# Patient Record
Sex: Female | Born: 1950 | Race: White | Hispanic: No | Marital: Married | Smoking: Current every day smoker
Health system: Southern US, Community
[De-identification: ages and names within clinical notes are randomized; demographics above are authoritative.]

## PROBLEM LIST (undated history)

## (undated) DIAGNOSIS — B059 Measles without complication: Secondary | ICD-10-CM

## (undated) DIAGNOSIS — J449 Chronic obstructive pulmonary disease, unspecified: Secondary | ICD-10-CM

## (undated) DIAGNOSIS — E785 Hyperlipidemia, unspecified: Secondary | ICD-10-CM

## (undated) DIAGNOSIS — B019 Varicella without complication: Secondary | ICD-10-CM

## (undated) HISTORY — DX: Hyperlipidemia, unspecified: E78.5

## (undated) HISTORY — DX: Varicella without complication: B01.9

---

## 1976-08-27 HISTORY — PX: TUBAL LIGATION: SHX77

## 2006-08-09 ENCOUNTER — Other Ambulatory Visit: Payer: Self-pay

## 2006-08-09 ENCOUNTER — Emergency Department: Payer: Self-pay | Admitting: Emergency Medicine

## 2006-12-09 ENCOUNTER — Other Ambulatory Visit: Payer: Self-pay

## 2006-12-09 ENCOUNTER — Emergency Department: Payer: Self-pay | Admitting: General Practice

## 2009-09-18 ENCOUNTER — Emergency Department: Payer: Self-pay

## 2013-09-25 ENCOUNTER — Emergency Department: Payer: Self-pay | Admitting: Emergency Medicine

## 2013-09-25 LAB — BASIC METABOLIC PANEL
Anion Gap: 4 — ABNORMAL LOW (ref 7–16)
BUN: 14 mg/dL (ref 7–18)
Calcium, Total: 8.9 mg/dL (ref 8.5–10.1)
Chloride: 106 mmol/L (ref 98–107)
Co2: 27 mmol/L (ref 21–32)
Creatinine: 1.03 mg/dL (ref 0.60–1.30)
EGFR (African American): >60
EGFR (Non-African Amer.): 57 — ABNORMAL LOW
Glucose: 103 mg/dL — ABNORMAL HIGH (ref 65–99)
Osmolality: 275 (ref 275–301)
Potassium: 3.6 mmol/L (ref 3.5–5.1)
Sodium: 137 mmol/L (ref 136–145)

## 2013-09-25 LAB — CBC
HCT: 35.4 % (ref 35.0–47.0)
HGB: 12.3 g/dL (ref 12.0–16.0)
MCH: 34.2 pg — ABNORMAL HIGH (ref 26.0–34.0)
MCHC: 34.7 g/dL (ref 32.0–36.0)
MCV: 98 fL (ref 80–100)
Platelet: 183 10*3/uL (ref 150–440)
RBC: 3.6 10*6/uL — ABNORMAL LOW (ref 3.80–5.20)
RDW: 12.4 % (ref 11.5–14.5)
WBC: 10.4 10*3/uL (ref 3.6–11.0)

## 2014-11-08 ENCOUNTER — Encounter (INDEPENDENT_AMBULATORY_CARE_PROVIDER_SITE_OTHER): Payer: Self-pay

## 2014-11-08 ENCOUNTER — Ambulatory Visit (INDEPENDENT_AMBULATORY_CARE_PROVIDER_SITE_OTHER): Payer: Managed Care, Other (non HMO) | Admitting: Nurse Practitioner

## 2014-11-08 ENCOUNTER — Encounter: Payer: Self-pay | Admitting: Nurse Practitioner

## 2014-11-08 VITALS — BP 126/70 | HR 60 | Temp 97.8°F | Resp 14 | Ht 62.75 in | Wt 110.1 lb

## 2014-11-08 DIAGNOSIS — F17200 Nicotine dependence, unspecified, uncomplicated: Secondary | ICD-10-CM

## 2014-11-08 DIAGNOSIS — Z7189 Other specified counseling: Secondary | ICD-10-CM | POA: Diagnosis not present

## 2014-11-08 DIAGNOSIS — Z720 Tobacco use: Secondary | ICD-10-CM | POA: Diagnosis not present

## 2014-11-08 DIAGNOSIS — Z23 Encounter for immunization: Secondary | ICD-10-CM

## 2014-11-08 DIAGNOSIS — Z7689 Persons encountering health services in other specified circumstances: Secondary | ICD-10-CM | POA: Insufficient documentation

## 2014-11-08 NOTE — Progress Notes (Signed)
Subjective:    Patient ID: Alejandra Ortiz, female    DOB: 1950/10/30, 64 y.o.   MRN: [id number]  HPI  Alejandra Ortiz is a 64 yo female establishing care.   1) New pt info:    Diet- At home  Exercise- No formal  Immunizations- Needs tdap  Mammogram- 1995 normal, refuses another  Pap- Unknown, 1978 tubal ligation   Bone Density-  Never  Colonoscopy- No to   Eye Exam- UTD  Dental Exam- Needs dental work   2) Chronic Problems-  Recurrent Pneumonia- goes to ER yearly for pneumonia.   3) Acute Problems-  Tobacco use- Patch and gum- likes the gum best, but still smoking.    Review of Systems  Constitutional: Negative for fever, chills, diaphoresis and fatigue.  Respiratory: Negative for chest tightness, shortness of breath and wheezing.   Cardiovascular: Negative for chest pain, palpitations and leg swelling.  Gastrointestinal: Negative for nausea, vomiting, diarrhea and constipation.  Genitourinary: Negative for dysuria.  Musculoskeletal: Negative for back pain and neck pain.  Skin: Negative for rash.  Neurological: Negative for dizziness, weakness, numbness and headaches.  Hematological: Does not bruise/bleed easily.  Psychiatric/Behavioral: Negative for suicidal ideas and sleep disturbance. The patient is not nervous/anxious.    Past Medical History  Diagnosis Date  . Chicken pox     History   Social History  . Marital Status: Married    Spouse Name: N/A  . Number of Children: N/A  . Years of Education: N/A   Occupational History  . Not on file.   Social History Main Topics  . Smoking status: Current Every Day Smoker -- 1.00 packs/day    Types: Cigarettes  . Smokeless tobacco: Never Used  . Alcohol Use: No  . Drug Use: No  . Sexual Activity: No   Other Topics Concern  . 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 Tubal ligation  1978    Family History  Problem Relation Age of Onset  . Family history unknown: Yes    No Known Allergies  No current outpatient prescriptions on file prior to visit.   No current facility-administered medications on file prior to visit.       Objective:   Physical Exam  Constitutional: She is oriented to person, place, and time. She appears well-developed and well-nourished. No distress.  BP 126/70 mmHg  Pulse 60  Temp(Src) 97.8 F (36.6 C) (Oral)  Resp 14  Ht 5' 2.75" (1.594 m)  Wt 110 lb 1.9 oz (49.95 kg)  BMI 19.66 kg/m2  SpO2 97% Thin   HENT:  Head: Normocephalic and atraumatic.  Right Ear: External ear normal.  Left Ear: External ear normal.  Eyes: EOM are normal. Pupils are equal, round, and reactive to light. Right eye exhibits no discharge. Left eye exhibits no discharge. No scleral icterus.  Cardiovascular: Normal rate, regular rhythm and normal heart sounds.  Exam reveals no gallop and no friction rub.   No murmur heard. Pulmonary/Chest: Effort normal and breath sounds normal. No respiratory distress. She has no wheezes. She has no rales. She exhibits no tenderness.  Musculoskeletal: Normal range of motion. She exhibits no edema or tenderness.  Neurological:  She is alert and oriented to person, place, and time. No cranial nerve deficit. She exhibits normal muscle tone. Coordination normal.  Skin: Skin is warm and dry. No rash noted. She is not diaphoretic.  Psychiatric: She has a normal mood and affect. Her behavior is normal. Judgment and thought content normal.      Assessment & Plan:

## 2014-11-08 NOTE — Patient Instructions (Addendum)
See you on the 23rd!   Nice to meet you and welcome to [facility]!

## 2014-11-08 NOTE — Progress Notes (Signed)
Pre visit review using our clinic review tool, if applicable. No additional management support is needed unless otherwise documented below in the visit note. 

## 2014-11-08 NOTE — Assessment & Plan Note (Signed)
Discussed acute and chronic issues. Reviewed health maintenance measures, PFSHx, and immunizations. Pt will need a physical for her job and is returning on the 23rd of March. Will place bone density referral then.

## 2014-11-09 ENCOUNTER — Telehealth: Payer: Self-pay | Admitting: Nurse Practitioner

## 2014-11-09 NOTE — Telephone Encounter (Signed)
emmi mailed  °

## 2014-11-17 ENCOUNTER — Ambulatory Visit (INDEPENDENT_AMBULATORY_CARE_PROVIDER_SITE_OTHER): Payer: Managed Care, Other (non HMO) | Admitting: Nurse Practitioner

## 2014-11-17 ENCOUNTER — Encounter: Payer: Self-pay | Admitting: Nurse Practitioner

## 2014-11-17 VITALS — BP 116/68 | HR 70 | Temp 98.4°F | Resp 14 | Ht 62.75 in | Wt 110.8 lb

## 2014-11-17 DIAGNOSIS — Z13820 Encounter for screening for osteoporosis: Secondary | ICD-10-CM

## 2014-11-17 DIAGNOSIS — M791 Myalgia, unspecified site: Secondary | ICD-10-CM

## 2014-11-17 DIAGNOSIS — Z720 Tobacco use: Secondary | ICD-10-CM

## 2014-11-17 DIAGNOSIS — Z0000 Encounter for general adult medical examination without abnormal findings: Secondary | ICD-10-CM | POA: Diagnosis not present

## 2014-11-17 DIAGNOSIS — F17200 Nicotine dependence, unspecified, uncomplicated: Secondary | ICD-10-CM

## 2014-11-17 LAB — COMPREHENSIVE METABOLIC PANEL
ALT: 16 U/L (ref 0–35)
AST: 19 U/L (ref 0–37)
Albumin: 4 g/dL (ref 3.5–5.2)
Alkaline Phosphatase: 60 U/L (ref 39–117)
BUN: 14 mg/dL (ref 6–23)
CO2: 31 mEq/L (ref 19–32)
Calcium: 9.9 mg/dL (ref 8.4–10.5)
Chloride: 105 mEq/L (ref 96–112)
Creatinine, Ser: 0.97 mg/dL (ref 0.40–1.20)
GFR: 61.21 mL/min (ref >60.00)
Glucose, Bld: 98 mg/dL (ref 70–99)
Potassium: 5.1 mEq/L (ref 3.5–5.1)
Sodium: 138 mEq/L (ref 135–145)
Total Bilirubin: 0.5 mg/dL (ref 0.2–1.2)
Total Protein: 6.7 g/dL (ref 6.0–8.3)

## 2014-11-17 LAB — LIPID PANEL
Cholesterol: 197 mg/dL (ref 0–200)
HDL: 62.8 mg/dL (ref >39.00)
LDL Cholesterol: 121 mg/dL — ABNORMAL HIGH (ref 0–99)
NonHDL: 134.2
Total CHOL/HDL Ratio: 3
Triglycerides: 64 mg/dL (ref 0.0–149.0)
VLDL: 12.8 mg/dL (ref 0.0–40.0)

## 2014-11-17 LAB — HEMOGLOBIN A1C: Hgb A1c MFr Bld: 5.4 % (ref 4.6–6.5)

## 2014-11-17 LAB — CBC WITH DIFFERENTIAL/PLATELET
Basophils Absolute: 0 10*3/uL (ref 0.0–0.1)
Basophils Relative: 0.5 % (ref 0.0–3.0)
Eosinophils Absolute: 0.1 10*3/uL (ref 0.0–0.7)
Eosinophils Relative: 1.1 % (ref 0.0–5.0)
HCT: 41.5 % (ref 36.0–46.0)
Hemoglobin: 13.9 g/dL (ref 12.0–15.0)
Lymphocytes Relative: 24.1 % (ref 12.0–46.0)
Lymphs Abs: 2.1 10*3/uL (ref 0.7–4.0)
MCHC: 33.6 g/dL (ref 30.0–36.0)
MCV: 97.1 fl (ref 78.0–100.0)
Monocytes Absolute: 0.5 10*3/uL (ref 0.1–1.0)
Monocytes Relative: 5.3 % (ref 3.0–12.0)
Neutro Abs: 6 10*3/uL (ref 1.4–7.7)
Neutrophils Relative %: 69 % (ref 43.0–77.0)
Platelets: 224 10*3/uL (ref 150.0–400.0)
RBC: 4.27 Mil/uL (ref 3.87–5.11)
RDW: 13.1 % (ref 11.5–15.5)
WBC: 8.6 10*3/uL (ref 4.0–10.5)

## 2014-11-17 LAB — TSH: TSH: 2.19 u[IU]/mL (ref 0.35–4.50)

## 2014-11-17 NOTE — Patient Instructions (Signed)

## 2014-11-17 NOTE — Progress Notes (Signed)
Pre visit review using our clinic review tool, if applicable. No additional management support is needed unless otherwise documented below in the visit note. 

## 2014-11-17 NOTE — Assessment & Plan Note (Signed)
Will obtain Bone Density screening

## 2014-11-17 NOTE — Progress Notes (Signed)
Subjective:    Patient ID: Alejandra Ortiz, female    DOB: Sep 23, 1950, 64 y.o.   MRN: [id number]  HPI  Alejandra Ortiz is a 64 yo female here for her annual physical.   1) Health Maintenance-   Diet- Eats at home   Exercise- No formal   Immunizations- UTD  Mammogram- Denies wanting another   Pap- Age stops at 6265 per USPSTF recommendations  Bone Density- will place an order  Colonoscopy- Refuses  Eye Exam- UTD  Dental Exam- Needs dental work   2) Chronic Problems-  None  3) Acute Problems-  Tobacco use- 4 people smoking in house hold   When walking at work- left leg gets weak- denies aching  "feels like she ran a whole marathon on her left leg"   No giving out, stops rests and then it resolves- only happens at work   Review of Systems  Constitutional: Negative for fever, chills, diaphoresis, fatigue and unexpected weight change.  HENT: Negative for tinnitus and trouble swallowing.   Gastrointestinal: Negative for nausea, vomiting, abdominal pain, diarrhea, constipation and blood in stool.  Endocrine: Negative for polydipsia, polyphagia and polyuria.  Genitourinary: Negative for dysuria, hematuria, vaginal discharge and vaginal pain.  Musculoskeletal: Negative for myalgias, back pain, arthralgias and gait problem.  Skin: Negative for color change and rash.  Neurological: Negative for dizziness, weakness, numbness and headaches.  Hematological: Does not bruise/bleed easily.  Psychiatric/Behavioral: Negative for suicidal ideas and sleep disturbance. The patient is not nervous/anxious.    Past Medical History  Diagnosis Date  . Chicken pox     History   Social History  . Marital Status: Married    Spouse Name: N/A  . Number of Children: N/A  . Years of Education: N/A   Occupational History  . Not on file.   Social History Main Topics  . Smoking status: Current Every Day Smoker -- 1.00 packs/day    Types: Cigarettes  . Smokeless tobacco: Never Used  . Alcohol Use:  No  . Drug Use: No  . Sexual Activity: No   Other Topics Concern  . 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 Tubal ligation  1978    Family History  Problem Relation Age of Onset  . Family history unknown: Yes    No Known Allergies  No current outpatient prescriptions on file prior to visit.   No current facility-administered medications on file prior to visit.      Objective:   Physical Exam  Constitutional: She is oriented to person, place, and time. She appears well-developed and well-nourished. No distress.  BP 116/68 mmHg  Pulse 70  Temp(Src) 98.4 F (36.9 C) (Oral)  Resp 14  Ht 5' 2.75" (1.594 m)  Wt 110 lb 12 oz (50.236 kg)  BMI 19.77 kg/m2  SpO2 95%   HENT:  Head: Normocephalic and atraumatic.  Right Ear: External ear normal.  Left Ear: External ear normal.  Nose: Nose normal.  Mouth/Throat: Oropharynx is clear and moist. No oropharyngeal exudate.  TMs and canals clear bilaterally  Eyes: Conjunctivae and EOM are normal. Pupils are equal, round, and reactive to light. Right eye exhibits no discharge. Left eye  exhibits no discharge. No scleral icterus.  Neck: Normal range of motion. Neck supple. No thyromegaly present.  Cardiovascular: Normal rate, regular rhythm, normal heart sounds and intact distal pulses.  Exam reveals no gallop and no friction rub.   No murmur heard. Pulmonary/Chest: Effort normal and breath sounds normal. No respiratory distress. She has no wheezes. She has no rales. She exhibits no tenderness.  Abdominal: Soft. Bowel sounds are normal. She exhibits no distension and no mass. There is no tenderness. There is no rebound and no guarding.  Musculoskeletal: Normal range of motion. She exhibits no edema  or tenderness.  Lymphadenopathy:    She has no cervical adenopathy.  Neurological: She is alert and oriented to person, place, and time. She has normal reflexes. No cranial nerve deficit. She exhibits normal muscle tone. Coordination normal.  Skin: Skin is warm and dry. No rash noted. She is not diaphoretic. No erythema. No pallor.  Psychiatric: She has a normal mood and affect. Her behavior is normal. Judgment and thought content normal.        Assessment & Plan:

## 2014-11-18 ENCOUNTER — Telehealth: Payer: Self-pay | Admitting: Nurse Practitioner

## 2014-11-18 ENCOUNTER — Encounter: Payer: Self-pay | Admitting: *Deleted

## 2014-11-18 NOTE — Telephone Encounter (Signed)
emmi emailed °

## 2014-11-24 DIAGNOSIS — F17210 Nicotine dependence, cigarettes, uncomplicated: Secondary | ICD-10-CM | POA: Insufficient documentation

## 2014-11-24 DIAGNOSIS — F17200 Nicotine dependence, unspecified, uncomplicated: Secondary | ICD-10-CM | POA: Insufficient documentation

## 2014-11-24 DIAGNOSIS — M791 Myalgia, unspecified site: Secondary | ICD-10-CM | POA: Insufficient documentation

## 2014-11-24 NOTE — Assessment & Plan Note (Signed)
Stable. Normal exam today. Asked pt to stay hydrated and we will continue to follow.

## 2014-11-24 NOTE — Assessment & Plan Note (Addendum)
Discussed acute and chronic issues. Reviewed health maintenance measures, PFSHx, and immunizations. Obtain routine labs TSH, Lipid panel, CBC w/ diff, A1c, and CMET.  Breast exam normal today.   Refuses: Mammograms and colonoscopy.

## 2014-12-06 NOTE — Assessment & Plan Note (Addendum)
Will try gum in future. Discussed barriers to cessation. Encouraged cessation.

## 2015-08-31 ENCOUNTER — Ambulatory Visit (INDEPENDENT_AMBULATORY_CARE_PROVIDER_SITE_OTHER): Payer: Managed Care, Other (non HMO) | Admitting: Nurse Practitioner

## 2015-08-31 ENCOUNTER — Encounter: Payer: Self-pay | Admitting: Nurse Practitioner

## 2015-08-31 VITALS — BP 130/80 | HR 108 | Temp 98.9°F | Ht 62.0 in | Wt 107.0 lb

## 2015-08-31 DIAGNOSIS — F17200 Nicotine dependence, unspecified, uncomplicated: Secondary | ICD-10-CM

## 2015-08-31 DIAGNOSIS — J441 Chronic obstructive pulmonary disease with (acute) exacerbation: Secondary | ICD-10-CM | POA: Diagnosis not present

## 2015-08-31 MED ORDER — ALBUTEROL SULFATE HFA 108 (90 BASE) MCG/ACT IN AERS: 2.0000 | INHALATION_SPRAY | Freq: Four times a day (QID) | RESPIRATORY_TRACT | Status: DC | PRN

## 2015-08-31 MED ORDER — DOXYCYCLINE HYCLATE 100 MG PO TABS: 100.0000 mg | ORAL_TABLET | Freq: Two times a day (BID) | ORAL | Status: DC

## 2015-08-31 MED ORDER — VARENICLINE TARTRATE 0.5 MG X 11 & 1 MG X 42 PO MISC: ORAL | Status: DC

## 2015-08-31 NOTE — Patient Instructions (Addendum)
Robitussin over the counter or your mucinex for cough   Inhaler two puffs every 6 hours- sent to your pharmacy  Doxycyline twice daily x 7 days.   Please take a probiotic ( Align, Floraque or Culturelle) while you are on the antibiotic to prevent a serious antibiotic associated diarrhea  Called clostirudium dificile colitis and a vaginal yeast infection.  Seek emergency care if not helpful or if you can't speak in complete sentences

## 2015-08-31 NOTE — Progress Notes (Signed)
Patient ID: Alejandra Ortiz, female    DOB: [birth year]  Age: 65 y.o. MRN: [id number]  CC: Cough   HPI Alejandra Ortiz presents for Cough x 5 days.   1) Cough x 5 days with chest congestion Productive- colored Treatment to date: Mucinex  Tylenol Cold/sinus Alkaseltzer Denies sick contacts  Tobacco Use:  Patches- welps  Gum- not helpful chews between cigarettes   History Ilee has a past medical history of Chicken pox.   She has past surgical history that includes Tubal ligation (1978).   Her Family history is unknown by patient.She reports that she has been smoking Cigarettes.  She has been smoking about 1.00 pack per day. She has never used smokeless tobacco. She reports that she does not drink alcohol or use illicit drugs.  No outpatient prescriptions prior to visit.   No facility-administered medications prior to visit.    ROS Review of Systems  Constitutional: Positive for fever. Negative for chills, diaphoresis and fatigue.  HENT: Positive for congestion. Negative for ear pain, postnasal drip, rhinorrhea, sinus pressure, sneezing and sore throat.   Eyes: Negative for visual disturbance.  Respiratory: Positive for cough. Negative for chest tightness, shortness of breath and wheezing.   Gastrointestinal: Negative for nausea, vomiting and diarrhea.  Skin: Negative for rash.   Objective:  BP 130/80 mmHg  Pulse 108  Temp(Src) 98.9 F (37.2 C)  Ht 5\' 2"  (1.575 m)  Wt 107 lb (48.535 kg)  BMI 19.57 kg/m2  Physical Exam  Constitutional: She is oriented to person, place, and time. She appears well-developed and well-nourished. No distress.  HENT:  Head: Normocephalic and atraumatic.  Right Ear: External ear normal.  Left Ear: External ear normal.  Mouth/Throat: No oropharyngeal exudate.  TM's clear bilaterally  Eyes: EOM are normal. Right eye exhibits no discharge. Left eye exhibits no discharge. No scleral icterus.  Neck: Normal range of motion. Neck supple.   Cardiovascular: Normal rate, regular rhythm and normal heart sounds.  Exam reveals no gallop and no friction rub.   No murmur heard. Pulmonary/Chest: Effort normal. No respiratory distress. She has wheezes. She has no rales. She exhibits no tenderness.  Lymphadenopathy:    She has no cervical adenopathy.  Neurological: She is alert and oriented to person, place, and time. No cranial nerve deficit. She exhibits normal muscle tone. Coordination normal.  Skin: Skin is warm and dry. No rash noted. She is not diaphoretic.  Psychiatric: She has a normal mood and affect. Her behavior is normal. Judgment and thought content normal.   Assessment & Plan:   Adin was seen today for cough.  Diagnoses and all orders for this visit:  Tobacco use disorder  COPD exacerbation (HCC)  Other orders -     albuterol (PROAIR HFA) 108 (90 Base) MCG/ACT inhaler; Inhale 2 puffs into the lungs every 6 (six) hours as needed for wheezing or shortness of breath. -     doxycycline (VIBRA-TABS) 100 MG tablet; Take 1 tablet (100 mg total) by mouth 2 (two) times daily. -     varenicline (CHANTIX STARTING MONTH PAK) 0.5 MG X 11 & 1 MG X 42 tablet; Take one 0.5 mg tablet by mouth once daily for 3 days, then increase to one 0.5 mg tablet twice daily for 4 days, then increase to one 1 mg tablet twice daily.   I am having Ms. Furber start on albuterol, doxycycline, and varenicline.  Meds ordered this encounter  Medications  . albuterol (PROAIR HFA) 108 (90  Base) MCG/ACT inhaler    Sig: Inhale 2 puffs into the lungs every 6 (six) hours as needed for wheezing or shortness of breath.    Dispense:  1 Inhaler    Refill:  2    Order Specific Question:  Supervising Provider    Answer:  Darrick HuntsmanULLO, TERESA L [[id number]]  . doxycycline (VIBRA-TABS) 100 MG tablet    Sig: Take 1 tablet (100 mg total) by mouth 2 (two) times daily.    Dispense:  14 tablet    Refill:  0    Order Specific Question:  Supervising Provider    Answer:   Duncan DullULLO, TERESA L [[id number]]  . varenicline (CHANTIX STARTING MONTH PAK) 0.5 MG X 11 & 1 MG X 42 tablet    Sig: Take one 0.5 mg tablet by mouth once daily for 3 days, then increase to one 0.5 mg tablet twice daily for 4 days, then increase to one 1 mg tablet twice daily.    Dispense:  53 tablet    Refill:  0    Order Specific Question:  Supervising Provider    Answer:  Sherlene ShamsULLO, TERESA L [[id number]]     Follow-up: Return if symptoms worsen or fail to improve.

## 2015-09-05 ENCOUNTER — Encounter: Payer: Self-pay | Admitting: Nurse Practitioner

## 2015-09-05 DIAGNOSIS — J441 Chronic obstructive pulmonary disease with (acute) exacerbation: Secondary | ICD-10-CM | POA: Insufficient documentation

## 2015-09-05 NOTE — Assessment & Plan Note (Signed)
Stable. Would like to try Chantix- script printed in case of her wanting to use

## 2015-09-05 NOTE — Assessment & Plan Note (Addendum)
Thin and avid smoker  New onset exacerbation Albuterol inhaler refilled Doxycyline to cover for bacteria since she is tachycardic  Advised pt to work on stopping smoking.  Advised when to seek emergency care- fever, unable to complete sentences, weakness ect... FU prn worsening/failure to improve.

## 2015-10-20 ENCOUNTER — Emergency Department
Admission: EM | Admit: 2015-10-20 | Discharge: 2015-10-20 | Discharge status: Against Medical Advice | Payer: Managed Care, Other (non HMO) | Attending: Emergency Medicine | Admitting: Emergency Medicine

## 2015-10-20 ENCOUNTER — Encounter: Payer: Self-pay | Admitting: Emergency Medicine

## 2015-10-20 ENCOUNTER — Emergency Department: Payer: Managed Care, Other (non HMO)

## 2015-10-20 DIAGNOSIS — Z5321 Procedure and treatment not carried out due to patient leaving prior to being seen by health care provider: Secondary | ICD-10-CM | POA: Diagnosis not present

## 2015-10-20 DIAGNOSIS — J441 Chronic obstructive pulmonary disease with (acute) exacerbation: Secondary | ICD-10-CM | POA: Diagnosis not present

## 2015-10-20 DIAGNOSIS — Z5329 Procedure and treatment not carried out because of patient's decision for other reasons: Secondary | ICD-10-CM

## 2015-10-20 DIAGNOSIS — Z792 Long term (current) use of antibiotics: Secondary | ICD-10-CM | POA: Insufficient documentation

## 2015-10-20 DIAGNOSIS — F17210 Nicotine dependence, cigarettes, uncomplicated: Secondary | ICD-10-CM | POA: Diagnosis not present

## 2015-10-20 DIAGNOSIS — Z5320 Procedure and treatment not carried out because of patient's decision for unspecified reasons: Secondary | ICD-10-CM

## 2015-10-20 DIAGNOSIS — R05 Cough: Secondary | ICD-10-CM | POA: Diagnosis present

## 2015-10-20 HISTORY — DX: Chronic obstructive pulmonary disease, unspecified: J44.9

## 2015-10-20 LAB — CBC
HCT: 38.9 % (ref 35.0–47.0)
Hemoglobin: 13.4 g/dL (ref 12.0–16.0)
MCH: 33 pg (ref 26.0–34.0)
MCHC: 34.5 g/dL (ref 32.0–36.0)
MCV: 95.6 fL (ref 80.0–100.0)
Platelets: 180 10*3/uL (ref 150–440)
RBC: 4.07 MIL/uL (ref 3.80–5.20)
RDW: 12.5 % (ref 11.5–14.5)
WBC: 4.3 10*3/uL (ref 3.6–11.0)

## 2015-10-20 LAB — RAPID INFLUENZA A&B ANTIGENS
Influenza A (ARMC): NOT DETECTED
Influenza B (ARMC): NOT DETECTED

## 2015-10-20 MED ORDER — PREDNISONE 20 MG PO TABS: 60.0000 mg | ORAL_TABLET | Freq: Every day | ORAL | Status: DC

## 2015-10-20 MED ORDER — METHYLPREDNISOLONE SODIUM SUCC 125 MG IJ SOLR
125.0000 mg | Freq: Once | INTRAMUSCULAR | Status: AC
  Administered 2015-10-20: 125 mg via INTRAVENOUS
  Filled 2015-10-20: qty 2

## 2015-10-20 MED ORDER — LEVOFLOXACIN 750 MG PO TABS: 750.0000 mg | ORAL_TABLET | Freq: Every day | ORAL | Status: AC

## 2015-10-20 MED ORDER — SODIUM CHLORIDE 0.9 % IV BOLUS (SEPSIS)
1000.0000 mL | Freq: Once | INTRAVENOUS | Status: AC
  Administered 2015-10-20: 1000 mL via INTRAVENOUS

## 2015-10-20 MED ORDER — ALBUTEROL SULFATE (2.5 MG/3ML) 0.083% IN NEBU
5.0000 mg | INHALATION_SOLUTION | Freq: Once | RESPIRATORY_TRACT | Status: AC
  Administered 2015-10-20: 5 mg via RESPIRATORY_TRACT
  Filled 2015-10-20: qty 6

## 2015-10-20 MED ORDER — LEVOFLOXACIN 750 MG PO TABS
750.0000 mg | ORAL_TABLET | Freq: Once | ORAL | Status: AC
  Administered 2015-10-20: 750 mg via ORAL
  Filled 2015-10-20: qty 1

## 2015-10-20 MED ORDER — IPRATROPIUM BROMIDE 0.02 % IN SOLN
0.5000 mg | Freq: Once | RESPIRATORY_TRACT | Status: AC
  Administered 2015-10-20: 0.5 mg via RESPIRATORY_TRACT
  Filled 2015-10-20: qty 2.5

## 2015-10-20 NOTE — ED Notes (Signed)
Cough x 4 days, body aches, x 4 days. Husband has same symptoms.

## 2015-10-20 NOTE — ED Provider Notes (Addendum)
[facility] Emergency Department Provider Note  ____________________________________________   I have reviewed the triage vital signs and the nursing notes.   HISTORY  Chief Complaint Cough    HPI Alejandra Ortiz is a 65 y.o. female with a history of COPD, who sees is a heavy smoker. She's had a cough which is occasionally productive and wheeze for the last few days. No fever. Positive rhinorrhea. She is not on home oxygen. She does not have a home nebulizer but she does have home albuterol. She denies chest pain. She denies leg swelling.  Past Medical History  Diagnosis Date  . Chicken pox   . COPD (chronic obstructive pulmonary disease) (HCC)   . Emphysema of lung [facility])     Patient Active Problem List   Diagnosis Date Noted  . COPD exacerbation (HCC) 09/05/2015  . Tobacco use disorder 11/24/2014  . Myalgia 11/24/2014  . Routine general medical examination at a health care facility 11/17/2014  . Screening for osteoporosis 11/17/2014  . Encounter to establish care 11/08/2014    Past Surgical History  Procedure Laterality Date  . Tubal ligation  1978    Current Outpatient Rx  Name  Route  Sig  Dispense  Refill  . albuterol (PROAIR HFA) 108 (90 Base) MCG/ACT inhaler   Inhalation   Inhale 2 puffs into the lungs every 6 (six) hours as needed for wheezing or shortness of breath.   1 Inhaler   2   . doxycycline (VIBRA-TABS) 100 MG tablet   Oral   Take 1 tablet (100 mg total) by mouth 2 (two) times daily.   14 tablet   0   . varenicline (CHANTIX STARTING MONTH PAK) 0.5 MG X 11 & 1 MG X 42 tablet      Take one 0.5 mg tablet by mouth once daily for 3 days, then increase to one 0.5 mg tablet twice daily for 4 days, then increase to one 1 mg tablet twice daily.   53 tablet   0     Allergies Review of patient's allergies indicates no known allergies.  Family History  Problem Relation Age of Onset  . Family history unknown: Yes    Social  History Social History  Substance Use Topics  . Smoking status: Current Every Day Smoker -- 1.00 packs/day    Types: Cigarettes  . Smokeless tobacco: Never Used  . Alcohol Use: No    Review of Systems Constitutional: No fever/chills Eyes: No visual changes. ENT: No sore throat. No stiff neck no neck pain Cardiovascular: Denies chest pain. Respiratory: Positive shortness of breath. Gastrointestinal:   no vomiting.  No diarrhea.  No constipation. Genitourinary: Negative for dysuria. Musculoskeletal: Negative lower extremity swelling Skin: Negative for rash. Neurological: Negative for headaches, focal weakness or numbness. 10-point ROS otherwise negative.  ____________________________________________   PHYSICAL EXAM:  VITAL SIGNS: ED Triage Vitals  Enc Vitals Group     BP 10/20/15 1642 122/54 mmHg     Pulse Rate 10/20/15 1642 105     Resp 10/20/15 1642 20     Temp 10/20/15 1642 99.6 F (37.6 C)     Temp Source 10/20/15 1642 Oral     SpO2 10/20/15 1642 92 %     Weight 10/20/15 1642 107 lb (48.535 kg)     Height 10/20/15 1642  (1.575 m)     Head Cir --      Peak Flow --      Pain Score 10/20/15 1643 0  Pain Loc --      Pain Edu? --      Excl. in GC? --     Constitutional: Alert and oriented. The ill-appearing woman who is nontoxic but does not appear to feel well Eyes: Conjunctivae are normal. PERRL. EOMI. Head: Atraumatic. Nose: No congestion/rhinnorhea. Mouth/Throat: Mucous membranes are moist.  Oropharynx non-erythematous. Neck: No stridor.   Nontender with no meningismus Cardiovascular: Normal rate, regular rhythm. Grossly normal heart sounds.  Good peripheral circulation. Respiratory: Normal respiratory effort.  Diffuse wheezes and occasional rhonchi in all fields Abdominal: Soft and nontender. No distention. No guarding no rebound Back:  There is no focal tenderness or step off there is no midline tenderness there are no lesions noted. there is no CVA  tenderness Musculoskeletal: No lower extremity tenderness. No joint effusions, no DVT signs strong distal pulses no edema Neurologic:  Normal speech and language. No gross focal neurologic deficits are appreciated.  Skin:  Skin is warm, dry and intact. No rash noted. Psychiatric: Mood and affect are normal. Speech and behavior are normal.  ____________________________________________   LABS (all labs ordered are listed, but only abnormal results are displayed)  Labs Reviewed  RAPID INFLUENZA A&B ANTIGENS ([facility] ONLY)  CBC   ____________________________________________  EKG  I personally interpreted any EKGs ordered by me or triage  ____________________________________________  RADIOLOGY  I reviewed any imaging ordered by me or triage that were performed during my shift ____________________________________________   PROCEDURES  Procedure(s) performed: None  Critical Care performed: None  ____________________________________________   INITIAL IMPRESSION / ASSESSMENT AND PLAN / ED COURSE  Pertinent labs & imaging results that were available during my care of the patient were reviewed by me and considered in my medical decision making (see chart for details).  Patient was COPD presents with cough and wheeze. Chest x-ray does not show definitive infiltrate. She is coughing and wheezing. White count is normal. We will give her steroids and albuterol and reassess. Given her history she may also benefit from antibiotics although with no white count no fever and no infiltrate or totally be because of COPD with increased mucus production. If we cannot get her to clear she may need to be admitted.  ----------------------------------------- 7:59 PM on 10/20/2015 -----------------------------------------  Patient states she feels much better she is adamant that she wants to go home. She is still breathing over 30 times a minute however. Her lungs are clearer with better air  movement but still mild wheezes noted. I have encouraged her to consider admission to the hospital. We have given empiric antibiotics. I'll give her another breathing treatment and we'll reassess.  ----------------------------------------- 8:56 PM on 10/20/2015 -----------------------------------------  The patient has no complaints she states she feels back to baseline. . She continues to decline offered and advised admission. If she still feels better after albuterol we'll discharge her.  ----------------------------------------- 9:36 PM on 10/20/2015 -----------------------------------------  Patient's heart rate goes up to the 120s and even higher sometimes when she ambulates. I have strongly advised admission. I told her she could die at home. Impression saturation is in the mid to high 90s however and she is adamant that she will not be admitted to the hospital. She is adamant that she will go home. She absolutely denies admission. Family is in the room with her. Patient states she would prefer to sign against medical advise. Obviously cannot force her to stay. I have advised her in no uncertain terms that she could die if she leaves.  ____________________________________________   FINAL CLINICAL IMPRESSION(S) / ED DIAGNOSES  Final diagnoses:  None      This chart was dictated using voice recognition software.  Despite best efforts to proofread,  errors can occur which can change meaning.     Jeanmarie Plant, MD 10/20/15 1912  Jeanmarie Plant, MD 10/20/15 2000  Jeanmarie Plant, MD 10/20/15 7846  Jeanmarie Plant, MD 10/20/15 2137

## 2015-10-20 NOTE — ED Notes (Signed)
Pt ambulated with stand by assist. O2 stats remain at 95%

## 2015-10-20 NOTE — Discharge Instructions (Signed)
We have offered you admission to the hospital and advised that you stay. However you refused. You for go home. This is certainly your choice but if you change your mind or feel worse in any way we strongly advised you to return to the emergency room via ambulance. You are always welcome to return. Take the medications as prescribed, and follow up with your doctor tomorrow.

## 2015-10-26 ENCOUNTER — Ambulatory Visit: Payer: Managed Care, Other (non HMO) | Admitting: Nurse Practitioner

## 2015-10-28 ENCOUNTER — Ambulatory Visit (INDEPENDENT_AMBULATORY_CARE_PROVIDER_SITE_OTHER): Payer: Managed Care, Other (non HMO) | Admitting: Nurse Practitioner

## 2015-10-28 ENCOUNTER — Encounter: Payer: Self-pay | Admitting: Nurse Practitioner

## 2015-10-28 ENCOUNTER — Encounter: Payer: Self-pay | Admitting: Pulmonary Disease

## 2015-10-28 VITALS — BP 110/82 | HR 75 | Temp 98.0°F | Ht 62.0 in | Wt 105.8 lb

## 2015-10-28 DIAGNOSIS — F17200 Nicotine dependence, unspecified, uncomplicated: Secondary | ICD-10-CM

## 2015-10-28 DIAGNOSIS — J441 Chronic obstructive pulmonary disease with (acute) exacerbation: Secondary | ICD-10-CM

## 2015-10-28 MED ORDER — VARENICLINE TARTRATE 0.5 MG X 11 & 1 MG X 42 PO MISC: ORAL | Status: DC

## 2015-10-28 MED ORDER — FLUTICASONE FUROATE-VILANTEROL 100-25 MCG/INH IN AEPB: 1.0000 | INHALATION_SPRAY | Freq: Every day | RESPIRATORY_TRACT | Status: DC

## 2015-10-28 NOTE — Patient Instructions (Signed)
Fluticasone; Vilanterol inhalation powder What is this medicine? FLUTICASONE; VILANTEROL (floo TIK a sone; vye LAN ter ol) inhalation is a combination of two medicines that decrease inflammation and help to open up the airways of your lungs. It is for chronic obstructive pulmonary disease (COPD), including chronic bronchitis or emphysema. It is also used for asthma in adults to help control symptoms. Do NOT use for an acute asthma attack or COPD attack. This medicine may be used for other purposes; ask your health care provider or pharmacist if you have questions. What should I tell my health care provider before I take this medicine? They need to know if you have any of these conditions: -bone problems -immune system problems -diabetes -heart disease or irregular heartbeat -high blood pressure -infection -pheochromocytoma -seizures -thyroid disease -an unusual or allergic reaction to fluticasone, vilanterol, milk proteins, corticosteroids, other medicines, foods, dyes, or preservatives -pregnant or trying to get pregnant -breast-feeding How should I use this medicine? This medicine is inhaled through the mouth. It is used once per day. Follow the directions on the prescription label. Do not use a spacer device with this inhaler. Take your medicine at regular intervals. Do not take your medicine more often than directed. Do not stop taking except on your doctor's advice. Make sure that you are using your inhaler correctly. Ask you doctor or health care provider if you have any questions. A special MedGuide will be given to you by the pharmacist with each prescription and refill. Be sure to read this information carefully each time. Talk to your pediatrician regarding the use of this medicine in children. Special care may be needed. This medicine is not approved for use in children under 18 years of age. Overdosage: If you think you have taken too much of this medicine contact a poison control  center or emergency room at once. NOTE: This medicine is only for you. Do not share this medicine with others. What if I miss a dose? If you miss a dose, use it as soon as you can. If it is almost time for your next dose, use only that dose and continue with your regular schedule. Do not use double or extra doses. What may interact with this medicine? Do not take this medicine with any of the following medications: -cisapride -dofetilide -dronedarone -MAOIs like Carbex, Eldepryl, Marplan, Nardil, and Parnate -pimozide -thioridazine -ziprasidone This medicine may also interact with the following medications: -antiviral medicines for HIV or AIDS -beta-blockers like metoprolol and propranolol -certain medicines for depression, anxiety, or psychotic disturbances -diuretics -medicines for colds -medicines for fungal infections like ketoconazole and itraconazole -other medicines for breathing problems -other medicines that prolong the QT interval (cause an abnormal heart rhythm) This list may not describe all possible interactions. Give your health care provider a list of all the medicines, herbs, non-prescription drugs, or dietary supplements you use. Also tell them if you smoke, drink alcohol, or use illegal drugs. Some items may interact with your medicine. What should I watch for while using this medicine? Visit your doctor or health care professional for regular checkups. Tell your doctor or health care professional if your symptoms do not get better. If your symptoms get worse or if you need your short-acting inhalers more often, call your doctor right away. Do not use this medicine more than every 24 hours. If you are going to have surgery tell your doctor or health care professional that you are using this medicine. Try not to come in contact with   people with the chicken pox or measles. If you do, call your doctor. What side effects may I notice from receiving this medicine? Side  effects that you should report to your doctor or health care professional as soon as possible: -allergic reactions like skin rash or hives, swelling of the face, lips, or tongue -breathing problems right after inhaling your medicine -changes in vision -chest pain -fast, irregular heartbeat -feeling faint or lightheaded, falls -fever or chills -nausea, vomiting -tiredness Side effects that usually do not require medical attention (Report these to your doctor or health care professional if they continue or are bothersome.): -cough -headache -nervousness -sore throat -tremor This list may not describe all possible side effects. Call your doctor for medical advice about side effects. You may report side effects to FDA at 1-800-FDA-1088. Where should I keep my medicine? Keep out of the reach of children. Store at room temperature between 15 and 30 degrees C (59 and 86 degrees F). Store in a dry place away from direct heat or sunlight. Throw away 6 weeks after you remove the inhaler from the foil tray, or after the dose indicator reads 0, whichever comes first. Throw away any unopened packages after the expiration date. NOTE: This sheet is a summary. It may not cover all possible information. If you have questions about this medicine, talk to your doctor, pharmacist, or health care provider.    2016, Elsevier/Gold Standard. (2014-02-08 15:06:05)  

## 2015-10-28 NOTE — Assessment & Plan Note (Signed)
ED FU today Pt left AMA after they advised keeping her until she was stable She received and finished prednisone and doxycyline  Needs pulmonology follow up -referred today  [name] sent to pharmacy  FU in 2 weeks

## 2015-10-28 NOTE — Progress Notes (Signed)
Pre visit review using our clinic review tool, if applicable. No additional management support is needed unless otherwise documented below in the visit note. 

## 2015-10-28 NOTE — Progress Notes (Signed)
Patient ID: Alejandra Ortiz, female    DOB: 09/10/1950  Age: 65 y.o. MRN: [id number]  CC: Hospitalization Follow-up   HPI Alejandra Ortiz presents for ED follow up.   Pt was seen in the [facility] ED on 10/20/15 and left AMA  COPD exacerbation- finished abx and prednisone  Pt is a 1 ppd smoker Lost the Chantix script I gave at last visit   1) Pt finished prednisone and antibiotic  Feels improved  Coughing- clear mucous  Denies fever  Never seen pulmonology No inhalers used other than albuterol- no nebulizer at home   History Kylan has a past medical history of Chicken pox; COPD (chronic obstructive pulmonary disease) (HCC); and Emphysema of lung (HCC).   She has past surgical history that includes Tubal ligation (1978).   Her Family history is unknown by patient.She reports that she has been smoking Cigarettes.  She has been smoking about 1.00 pack per day. She has never used smokeless tobacco. She reports that she does not drink alcohol or use illicit drugs.  Outpatient Prescriptions Prior to Visit  Medication Sig Dispense Refill  . albuterol (PROAIR HFA) 108 (90 Base) MCG/ACT inhaler Inhale 2 puffs into the lungs every 6 (six) hours as needed for wheezing or shortness of breath. 1 Inhaler 2  . doxycycline (VIBRA-TABS) 100 MG tablet Take 1 tablet (100 mg total) by mouth 2 (two) times daily. (Patient not taking: Reported on 10/20/2015) 14 tablet 0  . predniSONE (DELTASONE) 20 MG tablet Take 3 tablets (60 mg total) by mouth daily. 12 tablet 0  . varenicline (CHANTIX STARTING MONTH PAK) 0.5 MG X 11 & 1 MG X 42 tablet Take one 0.5 mg tablet by mouth once daily for 3 days, then increase to one 0.5 mg tablet twice daily for 4 days, then increase to one 1 mg tablet twice daily. 53 tablet 0   No facility-administered medications prior to visit.    ROS Review of Systems  Constitutional: Negative for fever, chills, diaphoresis and fatigue.  Respiratory: Positive for cough and wheezing.  Negative for chest tightness and shortness of breath.   Cardiovascular: Negative for chest pain, palpitations and leg swelling.  Gastrointestinal: Negative for nausea, vomiting and diarrhea.  Skin: Negative for rash.  Neurological: Negative for dizziness, weakness, numbness and headaches.  Psychiatric/Behavioral: The patient is not nervous/anxious.     Objective:  BP 110/82 mmHg  Pulse 75  Temp(Src) 98 F (36.7 C) (Oral)  Ht  (1.575 m)  Wt 105 lb 12 oz (47.968 kg)  BMI 19.34 kg/m2  SpO2 93%  Physical Exam  Constitutional: She is oriented to person, place, and time. She appears well-developed and well-nourished. No distress.  Thin  HENT:  Head: Normocephalic and atraumatic.  Right Ear: External ear normal.  Left Ear: External ear normal.  Mouth/Throat: No oropharyngeal exudate.  TM's clear bilaterally  Eyes: Right eye exhibits no discharge. Left eye exhibits no discharge. No scleral icterus.  Cardiovascular: Normal rate, regular rhythm and normal heart sounds.   Pulmonary/Chest: Effort normal and breath sounds normal. No respiratory distress. She has no wheezes. She has no rales. She exhibits no tenderness.  Neurological: She is alert and oriented to person, place, and time. No cranial nerve deficit. She exhibits normal muscle tone. Coordination normal.  Skin: Skin is warm and dry. No rash noted. She is not diaphoretic.  Psychiatric: She has a normal mood and affect. Her behavior is normal. Judgment and thought content normal.   Assessment &  Plan:   Alejandra Ortiz was seen today for hospitalization follow-up.  Diagnoses and all orders for this visit:  COPD exacerbation (HCC) -     Ambulatory referral to Pulmonology  Tobacco use disorder  Other orders -     varenicline (CHANTIX STARTING MONTH PAK) 0.5 MG X 11 & 1 MG X 42 tablet; Take one 0.5 mg tablet by mouth once daily for 3 days, then increase to one 0.5 mg tablet twice daily for 4 days, then increase to one 1 mg tablet  twice daily. -     fluticasone furoate-vilanterol (BREO ELLIPTA) 100-25 MCG/INH AEPB; Inhale 1 puff into the lungs daily.   I have discontinued Alejandra Ortiz's doxycycline and predniSONE. I am also having her start on fluticasone furoate-vilanterol. Additionally, I am having her maintain her albuterol and varenicline.  Meds ordered this encounter  Medications  . varenicline (CHANTIX STARTING MONTH PAK) 0.5 MG X 11 & 1 MG X 42 tablet    Sig: Take one 0.5 mg tablet by mouth once daily for 3 days, then increase to one 0.5 mg tablet twice daily for 4 days, then increase to one 1 mg tablet twice daily.    Dispense:  53 tablet    Refill:  0    Order Specific Question:  Supervising Provider    Answer:  Duncan DullULLO, TERESA L [[id number]]  . fluticasone furoate-vilanterol (BREO ELLIPTA) 100-25 MCG/INH AEPB    Sig: Inhale 1 puff into the lungs daily.    Dispense:  28 each    Refill:  0    Order Specific Question:  Supervising Provider    Answer:  Sherlene ShamsULLO, TERESA L [[id number]]     Follow-up: Return in about 2 weeks (around 11/11/2015) for Follow up smoking cessation and COPD.

## 2015-10-28 NOTE — Assessment & Plan Note (Signed)
Chantix prescription was given again to pt since she reports losing the last one.

## 2015-11-14 ENCOUNTER — Ambulatory Visit (INDEPENDENT_AMBULATORY_CARE_PROVIDER_SITE_OTHER): Payer: Managed Care, Other (non HMO) | Admitting: Nurse Practitioner

## 2015-11-14 ENCOUNTER — Encounter: Payer: Self-pay | Admitting: Nurse Practitioner

## 2015-11-14 VITALS — BP 112/64 | HR 83 | Temp 98.0°F | Resp 14 | Ht 62.0 in | Wt 106.8 lb

## 2015-11-14 DIAGNOSIS — J441 Chronic obstructive pulmonary disease with (acute) exacerbation: Secondary | ICD-10-CM | POA: Diagnosis not present

## 2015-11-14 DIAGNOSIS — F17200 Nicotine dependence, unspecified, uncomplicated: Secondary | ICD-10-CM

## 2015-11-14 NOTE — Patient Instructions (Signed)
Follow up with Dr. Dema SeverinMungal on Wednesday.   Keep working on stopping smoking.

## 2015-11-14 NOTE — Progress Notes (Signed)
Patient ID: Alejandra Ortiz Ortiz, female    DOB: 1951/06/10  Age: 65 y.o. MRN: [id number]  CC: Follow-up   HPI Alejandra Ortiz Ortiz for follow up on COPD.  1) Chantix- gave script at last visit  Needs pulmonology  Alejandra Ortiz Ortiz uses in the mornings   Likes the new inhaler  Pulmonary on Wed.  Husband smokes in bedroom and he is not ready to quit   Alejandra Ortiz Ortiz has a past medical Alejandra Ortiz of Chicken pox; COPD (chronic obstructive pulmonary disease) (HCC); and Emphysema of lung (HCC).   She has past surgical Alejandra Ortiz that includes Tubal ligation (1978).   Her Family Alejandra Ortiz is unknown by patient.She reports that she has been smoking Cigarettes.  She has been smoking about 1.00 pack per day. She has never used smokeless tobacco. She reports that she does not drink alcohol or use illicit drugs.  Outpatient Prescriptions Prior to Visit  Medication Sig Dispense Refill  . albuterol (PROAIR HFA) 108 (90 Base) MCG/ACT inhaler Inhale 2 puffs into the lungs every 6 (six) hours as needed for wheezing or shortness of breath. 1 Inhaler 2  . fluticasone furoate-vilanterol (BREO Ortiz) 100-25 MCG/INH AEPB Inhale 1 puff into the lungs daily. 28 each 0  . varenicline (CHANTIX STARTING MONTH PAK) 0.5 MG X 11 & 1 MG X 42 tablet Take one 0.5 mg tablet by mouth once daily for 3 days, then increase to one 0.5 mg tablet twice daily for 4 days, then increase to one 1 mg tablet twice daily. (Patient not taking: Reported on 11/14/2015) 53 tablet 0   No facility-administered medications prior to visit.    ROS Review of Systems  Constitutional: Negative for fever, chills, diaphoresis and fatigue.  Respiratory: Positive for cough. Negative for chest tightness, shortness of breath and wheezing.   Cardiovascular: Negative for chest pain, palpitations and leg swelling.  Gastrointestinal: Negative for nausea, vomiting and diarrhea.  Skin: Negative for rash.  Neurological: Negative for dizziness and headaches.     Objective:  BP 112/64 mmHg  Pulse 83  Temp(Src) 98 F (36.7 C) (Oral)  Resp 14  Ht 5\' 2"  (1.575 m)  Wt 106 lb 12.8 oz (48.444 kg)  BMI 19.53 kg/m2  SpO2 97%  Physical Exam  Constitutional: She is oriented to person, place, and time. She appears well-developed and well-nourished. No distress.  HENT:  Head: Normocephalic and atraumatic.  Right Ear: External ear normal.  Left Ear: External ear normal.  Cardiovascular: Normal rate and regular rhythm.   Pulmonary/Chest: Effort normal. No respiratory distress. She has no wheezes. She has no rales. She exhibits no tenderness.  Neurological: She is alert and oriented to person, place, and time. No cranial nerve deficit. She exhibits normal muscle tone. Coordination normal.  Skin: Skin is warm and dry. No rash noted. She is not diaphoretic.  Psychiatric: She has a normal mood and affect. Her behavior is normal. Judgment and thought content normal.   Assessment & Plan:   Alejandra Ortiz Ortiz was seen today for follow-up.  Diagnoses and all orders for this visit:  Tobacco use disorder  COPD exacerbation (HCC)   I have discontinued Alejandra Ortiz Ortiz's varenicline. I am also having her maintain her albuterol and fluticasone furoate-vilanterol.  No orders of the defined types were placed in this encounter.     Follow-up: Return if symptoms worsen or fail to improve.

## 2015-11-16 ENCOUNTER — Institutional Professional Consult (permissible substitution): Payer: Managed Care, Other (non HMO) | Admitting: Internal Medicine

## 2015-11-20 NOTE — Assessment & Plan Note (Signed)
Not going to stop smoking she reports  Starting pt on Breo Continue proair as needed

## 2015-11-20 NOTE — Assessment & Plan Note (Signed)
Pt is not stopping smoking due to husband's influence and smoking in their home. She understands the severity of her decisions she reports. Advised her to follow up with Pulmonology on Wed.   Update- cancelled several appointments

## 2015-11-25 ENCOUNTER — Institutional Professional Consult (permissible substitution): Payer: Managed Care, Other (non HMO) | Admitting: Internal Medicine

## 2015-11-28 ENCOUNTER — Ambulatory Visit (INDEPENDENT_AMBULATORY_CARE_PROVIDER_SITE_OTHER): Payer: Managed Care, Other (non HMO) | Admitting: Nurse Practitioner

## 2015-11-28 ENCOUNTER — Encounter: Payer: Self-pay | Admitting: Nurse Practitioner

## 2015-11-28 VITALS — BP 112/68 | HR 78 | Temp 98.0°F | Ht 62.0 in | Wt 106.2 lb

## 2015-11-28 DIAGNOSIS — Z0000 Encounter for general adult medical examination without abnormal findings: Secondary | ICD-10-CM | POA: Diagnosis not present

## 2015-11-28 LAB — LIPID PANEL
Cholesterol: 240 mg/dL — ABNORMAL HIGH (ref 0–200)
HDL: 66.5 mg/dL (ref >39.00)
LDL Cholesterol: 159 mg/dL — ABNORMAL HIGH (ref 0–99)
NonHDL: 173.62
Total CHOL/HDL Ratio: 4
Triglycerides: 71 mg/dL (ref 0.0–149.0)
VLDL: 14.2 mg/dL (ref 0.0–40.0)

## 2015-11-28 LAB — COMPREHENSIVE METABOLIC PANEL
ALT: 7 U/L (ref 0–35)
AST: 11 U/L (ref 0–37)
Albumin: 3.9 g/dL (ref 3.5–5.2)
Alkaline Phosphatase: 61 U/L (ref 39–117)
BUN: 13 mg/dL (ref 6–23)
CO2: 29 mEq/L (ref 19–32)
Calcium: 9.8 mg/dL (ref 8.4–10.5)
Chloride: 103 mEq/L (ref 96–112)
Creatinine, Ser: 0.95 mg/dL (ref 0.40–1.20)
GFR: 62.5 mL/min (ref >60.00)
Glucose, Bld: 100 mg/dL — ABNORMAL HIGH (ref 70–99)
Potassium: 4.4 mEq/L (ref 3.5–5.1)
Sodium: 139 mEq/L (ref 135–145)
Total Bilirubin: 0.4 mg/dL (ref 0.2–1.2)
Total Protein: 6.7 g/dL (ref 6.0–8.3)

## 2015-11-28 LAB — CBC WITH DIFFERENTIAL/PLATELET
Basophils Absolute: 0 10*3/uL (ref 0.0–0.1)
Basophils Relative: 0.6 % (ref 0.0–3.0)
Eosinophils Absolute: 0.1 10*3/uL (ref 0.0–0.7)
Eosinophils Relative: 1.2 % (ref 0.0–5.0)
HCT: 40.6 % (ref 36.0–46.0)
Hemoglobin: 13.6 g/dL (ref 12.0–15.0)
Lymphocytes Relative: 23.6 % (ref 12.0–46.0)
Lymphs Abs: 1.7 10*3/uL (ref 0.7–4.0)
MCHC: 33.6 g/dL (ref 30.0–36.0)
MCV: 97.6 fl (ref 78.0–100.0)
Monocytes Absolute: 0.4 10*3/uL (ref 0.1–1.0)
Monocytes Relative: 6.3 % (ref 3.0–12.0)
Neutro Abs: 4.8 10*3/uL (ref 1.4–7.7)
Neutrophils Relative %: 68.3 % (ref 43.0–77.0)
Platelets: 251 10*3/uL (ref 150.0–400.0)
RBC: 4.16 Mil/uL (ref 3.87–5.11)
RDW: 13.6 % (ref 11.5–15.5)
WBC: 7 10*3/uL (ref 4.0–10.5)

## 2015-11-28 LAB — TSH: TSH: 2.92 u[IU]/mL (ref 0.35–4.50)

## 2015-11-28 MED ORDER — FLUTICASONE FUROATE-VILANTEROL 100-25 MCG/INH IN AEPB: 1.0000 | INHALATION_SPRAY | Freq: Every day | RESPIRATORY_TRACT | Status: DC

## 2015-11-28 NOTE — Progress Notes (Signed)
Patient ID: Alejandra Ortiz, female    DOB: 1951-04-15  Age: 65 y.o. MRN: [id number]  CC: Annual Exam   HPI Alejandra Ortiz presents for Annual Exam.   1) Annual Physical   Diet- No formal   Exercise- Active, but no formal   Immunizations- Needs Pna 23 vaccine  Mammogram- Refuses  Bone Density- Refuses  Colonoscopy- Refuses  Eye Exam- UTD   Dental Exam- UTD (getting teeth pulled) and get dentures  Labs- Today, coffee with tiny bit of sugar/cream  Fall- Neg.   Depression- Neg.  Refills: Elissa Hefty was very helpful    History Alejandra Ortiz has a past medical history of Chicken pox; COPD (chronic obstructive pulmonary disease) ([place]); and Emphysema of lung ([place]).   Alejandra Ortiz has past surgical history that includes Tubal ligation (1978).   Her Family history is unknown by patient.Alejandra Ortiz reports that Alejandra Ortiz has been smoking Cigarettes.  Alejandra Ortiz has been smoking about 1.00 pack per day. Alejandra Ortiz has never used smokeless tobacco. Alejandra Ortiz reports that Alejandra Ortiz does not drink alcohol or use illicit drugs.  Outpatient Prescriptions Prior to Visit  Medication Sig Dispense Refill  . albuterol (PROAIR HFA) 108 (90 Base) MCG/ACT inhaler Inhale 2 puffs into the lungs every 6 (six) hours as needed for wheezing or shortness of breath. 1 Inhaler 2  . fluticasone furoate-vilanterol (BREO ELLIPTA) 100-25 MCG/INH AEPB Inhale 1 puff into the lungs daily. 28 each 0   No facility-administered medications prior to visit.    ROS Review of Systems  Constitutional: Negative for fever, chills, diaphoresis, fatigue and unexpected weight change.  HENT: Negative for tinnitus and trouble swallowing.   Eyes: Negative for visual disturbance.  Respiratory: Positive for cough. Negative for chest tightness, shortness of breath and wheezing.   Cardiovascular: Negative for chest pain, palpitations and leg swelling.  Gastrointestinal: Negative for nausea, vomiting, abdominal pain, diarrhea, constipation and blood in stool.  Endocrine: Negative for  polydipsia, polyphagia and polyuria.  Genitourinary: Negative for dysuria, hematuria, vaginal discharge and vaginal pain.  Musculoskeletal: Positive for myalgias. Negative for back pain, arthralgias and gait problem.       Leg discomfort after walking at work - improved with rest  Skin: Negative for color change and rash.  Neurological: Negative for dizziness, weakness, numbness and headaches.  Hematological: Does not bruise/bleed easily.  Psychiatric/Behavioral: Negative for suicidal ideas and sleep disturbance. The patient is not nervous/anxious.     Objective:  BP 112/68 mmHg  Pulse 78  Temp(Src) 98 F (36.7 C) (Oral)  Ht '5\' 2"'  (1.575 m)  Wt 106 lb 4 oz (48.195 kg)  BMI 19.43 kg/m2  SpO2 97%  Physical Exam  Constitutional: Alejandra Ortiz is oriented to person, place, and time. Alejandra Ortiz appears well-developed and well-nourished. No distress.  Thin body habitus  HENT:  Head: Normocephalic and atraumatic.  Right Ear: External ear normal.  Left Ear: External ear normal.  Nose: Nose normal.  Mouth/Throat: Oropharynx is clear and moist. No oropharyngeal exudate.  TMs and canals clear bilaterally  Eyes: Conjunctivae and EOM are normal. Pupils are equal, round, and reactive to light. Right eye exhibits no discharge. Left eye exhibits no discharge. No scleral icterus.  Neck: Normal range of motion. Neck supple. No thyromegaly present.  Cardiovascular: Normal rate and regular rhythm.   Pulmonary/Chest: Effort normal. No respiratory distress. Alejandra Ortiz has no wheezes. Alejandra Ortiz has no rales. Alejandra Ortiz exhibits no tenderness.  Decreased breath sounds allover  Breast exam was without significant findings  Abdominal: Soft. Bowel sounds are normal.  Alejandra Ortiz exhibits no distension and no mass. There is no tenderness. There is no rebound and no guarding.  Genitourinary:  Deferred due to age and lack of complaints  Musculoskeletal: Normal range of motion. Alejandra Ortiz exhibits no edema or tenderness.  Lymphadenopathy:    Alejandra Ortiz has no  cervical adenopathy.  Neurological: Alejandra Ortiz is alert and oriented to person, place, and time. Alejandra Ortiz has normal reflexes. No cranial nerve deficit. Alejandra Ortiz exhibits normal muscle tone. Coordination normal.  Skin: Skin is warm and dry. No rash noted. Alejandra Ortiz is not diaphoretic. No erythema. No pallor.  Psychiatric: Alejandra Ortiz has a normal mood and affect. Her behavior is normal. Judgment and thought content normal.   Assessment & Plan:   Sache was seen today for annual exam.  Diagnoses and all orders for this visit:  Routine general medical examination at a health care facility -     CBC with Differential/Platelet -     TSH -     Lipid Profile -     Comp Met (CMET)  Other orders -     fluticasone furoate-vilanterol (BREO ELLIPTA) 100-25 MCG/INH AEPB; Inhale 1 puff into the lungs daily.   I am having Alejandra Ortiz maintain her albuterol, CHANTIX STARTING MONTH PAK, and fluticasone furoate-vilanterol.  Meds ordered this encounter  Medications  . CHANTIX STARTING MONTH PAK 0.5 MG X 11 & 1 MG X 42 tablet    Sig: See admin instructions. Reported on 11/28/2015    Refill:  0  . fluticasone furoate-vilanterol (BREO ELLIPTA) 100-25 MCG/INH AEPB    Sig: Inhale 1 puff into the lungs daily.    Dispense:  28 each    Refill:  11    Order Specific Question:  Supervising Provider    Answer:  Crecencio Mc [[id number]]     Follow-up: Return in about 1 year (around 11/27/2016) for CPE w/ labs.

## 2015-11-28 NOTE — Progress Notes (Signed)
Pre visit review using our clinic review tool, if applicable. No additional management support is needed unless otherwise documented below in the visit note. 

## 2015-11-28 NOTE — Patient Instructions (Signed)

## 2015-11-30 NOTE — Assessment & Plan Note (Signed)
Discussed acute and chronic issues. Reviewed health maintenance measures, PFSHx, and immunizations. Obtain routine labs TSH, Lipid panel, CBC w/ diff, and CMET.   Filled out form for work stating she had the annual exam  Needs Pna 23 vaccine- deferred per pt  Declines a lot of health maintenance measures despite educating

## 2015-12-06 ENCOUNTER — Other Ambulatory Visit: Payer: Self-pay | Admitting: Nurse Practitioner

## 2015-12-06 DIAGNOSIS — E785 Hyperlipidemia, unspecified: Secondary | ICD-10-CM

## 2015-12-06 MED ORDER — SIMVASTATIN 20 MG PO TABS: 20.0000 mg | ORAL_TABLET | Freq: Every day | ORAL | Status: DC

## 2015-12-14 ENCOUNTER — Institutional Professional Consult (permissible substitution): Payer: Managed Care, Other (non HMO) | Admitting: Pulmonary Disease

## 2015-12-26 ENCOUNTER — Institutional Professional Consult (permissible substitution): Payer: Managed Care, Other (non HMO) | Admitting: Internal Medicine

## 2015-12-26 ENCOUNTER — Encounter: Payer: Self-pay | Admitting: *Deleted

## 2016-02-01 ENCOUNTER — Ambulatory Visit: Payer: Managed Care, Other (non HMO) | Admitting: Family Medicine

## 2016-02-07 ENCOUNTER — Encounter: Payer: Self-pay | Admitting: Internal Medicine

## 2016-02-07 ENCOUNTER — Ambulatory Visit (INDEPENDENT_AMBULATORY_CARE_PROVIDER_SITE_OTHER): Payer: Managed Care, Other (non HMO) | Admitting: Internal Medicine

## 2016-02-07 ENCOUNTER — Encounter (INDEPENDENT_AMBULATORY_CARE_PROVIDER_SITE_OTHER): Payer: Self-pay

## 2016-02-07 VITALS — BP 120/62 | HR 70 | Ht 62.0 in | Wt 103.2 lb

## 2016-02-07 DIAGNOSIS — J432 Centrilobular emphysema: Secondary | ICD-10-CM | POA: Diagnosis not present

## 2016-02-07 DIAGNOSIS — R0600 Dyspnea, unspecified: Secondary | ICD-10-CM

## 2016-02-07 DIAGNOSIS — Z7189 Other specified counseling: Secondary | ICD-10-CM

## 2016-02-07 DIAGNOSIS — J984 Other disorders of lung: Secondary | ICD-10-CM | POA: Insufficient documentation

## 2016-02-07 DIAGNOSIS — J439 Emphysema, unspecified: Secondary | ICD-10-CM | POA: Insufficient documentation

## 2016-02-07 DIAGNOSIS — F17200 Nicotine dependence, unspecified, uncomplicated: Secondary | ICD-10-CM

## 2016-02-07 DIAGNOSIS — Z7689 Persons encountering health services in other specified circumstances: Secondary | ICD-10-CM

## 2016-02-07 NOTE — Assessment & Plan Note (Signed)
Multifactorial: COPD/emphysema, tobacco abuse, deconditioning  Plan: -Pulmonary function tests, 6 minute walk test -Avoid all forms of tobacco -Exercise as tolerated

## 2016-02-07 NOTE — Patient Instructions (Signed)
Follow up with Dr. Dema SeverinMungal in:3 months - We will set she will for lung cancer screening evaluation and possible lung cancer screening CAT scan -Avoid all forms of tobacco -Start Chantix as soon as possible -Continue with Breo -Avoid all secondhand smoking exposure -Pulmonary function test and 6 minute walk test prior to follow-up visit

## 2016-02-07 NOTE — Assessment & Plan Note (Signed)
Related to COPD/emphysema, continued tobacco abuse. Chest x-rays revealed patient, she has chronic lung scarring.  Plan: -Patient is a suitable candidate for lung cancer screening, and [facility] for lung cancer screening CT evaluation

## 2016-02-07 NOTE — Assessment & Plan Note (Signed)
She presents today to encounter establish care for a pulmonologist given her prolonged tobacco abuse, emphysema, shortness of breath, history of pneumonias, lung scarring.

## 2016-02-07 NOTE — Assessment & Plan Note (Signed)
Issue or clinical signs and symptoms of COPD, emphysema. Based on clinical symptoms she is most likely a stage II COPD further obstructive classification will be determined upon pulmonary function testing. Patient advised on quitting tobacco. Patient advised on starting her Chantix. The patient advised on avoidance of any type of secondhand smoke exposure, avoid the cigarettes, avoid any type of vapors or using vapors.  Plan: -Patient needs criteria for lung cancer screening, we'll referred to a lung cancer screening program -Continue with Breo 100 g, 1 puff daily gargle and rinse after each use, -Continue with rescue inhaler as directed -Avoid all forms of tobacco and vapors -Avoid all forms of secondhand smoke -Diet and exercise as tolerated.

## 2016-02-07 NOTE — Assessment & Plan Note (Signed)
Tobacco Cessation - Counseling regarding benefits of smoking cessation strategies was provided for more than 12 min. - Educated that at this time smoking- cessation represents the single most important step that patient can take to enhance the length and quality of live. - Educated patient regarding alternatives of behavior interventions, pharmacotherapy including NRT and non-nicotine therapy such, and combinations of both. - Patient at this time: Is a prescription for Chantix, her son recently passed away, and stated that she will start her Chantix soon

## 2016-02-07 NOTE — Progress Notes (Signed)
[facility] Pulmonary Medicine Consultation    Date: 02/07/2016  MRN# [id number] Alejandra Ortiz April 09, 1951  Referring Physician: Naomie Dean NP PMD - Naomie Dean NP Alejandra Ortiz is a 65 y.o. old female seen in consultation for COPD/Asthma optmization  CC:  Chief Complaint  Patient presents with  . pulmonary consult    pt ref by Dr. Naomie Dean for hx of copd exacerbation. last ED visit 2-23 w/cxr. c/o sob feels it's due to the heat, prod cough clear in colorX3wk.     HPI:  Patient is a pleasant 65 year old female past medical history of hyperlipidemia COPD/emphysema seen in consultation for COPD optimization. Patient referred by nurse practitioner for recent emergency room visit of February 2017 for suspected COPD exacerbation. Patient tells me she's been smoking 1 pack a day for the past 40 years. Her COPD symptoms are worse with allergies especially in the summer and winter. 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 In 2006 she switch employers and since then has not had any significant exacerbations or recurrent fits for pneumonia to her primary care physician or the emergency room. In February 2017 she was tried on different inhalers, most recently placed on Breo 100 g, and since then has had stable control of her COPD, only using albuterol maybe 2-3 times per week for shortness of breath and wheezing. She admits to dyspnea on exertion especially with incline, states that she can walk about 20-30 yards before getting significantly short of breath. She has a regular smoker's cough especially in the morning with clear sputum production. She has secondhand exposures of smoking from her husband and children. No significant surgeries in the past. 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 She has one dog that lives with her. On average she may see her primary care physician 1-2 times per year for COPD related symptoms.    PMHX:   Past Medical History  Diagnosis Date  . Chicken pox   . COPD (chronic obstructive pulmonary disease) (HCC)   . Emphysema of lung [facility])    Surgical Hx:  Past Surgical History  Procedure Laterality Date  . Tubal ligation  1978   Family Hx:  Family History  Problem Relation Age of Onset  . Family history unknown: Yes   Social Hx:   Social History  Substance Use Topics  . Smoking status: Current Every Day Smoker -- 1.00 packs/day for 48 years    Types: Cigarettes  . Smokeless tobacco: Never Used  . Alcohol Use: No   Medication:   Current Outpatient Rx  Name  Route  Sig  Dispense  Refill  . albuterol (PROAIR HFA) 108 (90 Base) MCG/ACT inhaler   Inhalation   Inhale 2 puffs into the lungs every 6 (six) hours as needed for wheezing or shortness of breath.   1 Inhaler   2   . fluticasone furoate-vilanterol (BREO ELLIPTA) 100-25 MCG/INH AEPB   Inhalation   Inhale 1 puff into the lungs daily.   28 each   11   . simvastatin (ZOCOR) 20 MG tablet   Oral   Take 1 tablet (20 mg total) by mouth daily.   30 tablet   1   . CHANTIX STARTING MONTH PAK 0.5 MG X 11 & 1 MG X 42 tablet  See admin instructions. Reported on 02/07/2016      0     Dispense as written.       Allergies:  Review of patient's allergies indicates no known allergies.  Review of Systems  Constitutional: Negative for fever and chills.  Eyes: Negative for blurred vision.  Respiratory: Positive for cough, sputum production, shortness of breath and wheezing. Negative for hemoptysis.   Cardiovascular: Negative for chest pain.  Gastrointestinal: Negative for heartburn.  Genitourinary: Negative for dysuria.  Musculoskeletal: Negative for myalgias.  Neurological: Negative for headaches.  Endo/Heme/Allergies: Does not bruise/bleed easily.      Physical Examination:   VS: BP 120/62 mmHg  Pulse 70  Ht  (1.575 m)  Wt 103 lb 3.2 oz (46.811 kg)  BMI 18.87 kg/m2  SpO2 96%  General Appearance: No distress, cachectic appearance  Neuro:without focal findings, mental status, speech normal, alert and oriented, cranial nerves 2-12 intact, reflexes normal and symmetric, sensation grossly normal  HEENT: PERRLA, EOM intact, no ptosis, no other lesions noticed; Mallampati 1 Pulmonary: normal breath sounds., diaphragmatic excursion normal.No wheezing, No rales;   Sputum Production:  None CardiovascularNormal S1,S2.  No m/r/g.  Abdominal aorta pulsation normal.    Abdomen: Benign, Soft, non-tender, No masses, hepatosplenomegaly, No lymphadenopathy Renal:  No costovertebral tenderness  GU:  No performed at this time. Endoc: No evident thyromegaly, no signs of acromegaly or Cushing features Skin:   warm, no rashes, no ecchymosis  Extremities: normal, no cyanosis, clubbing, no edema, warm with normal capillary refill. Other findings: None   Labs results:   Rad results: (The following images and results were reviewed by Dr. Dema Severin on 02/07/2016). CXR 09/2015 CHEST 2 VIEW  COMPARISON: 09/25/2013  FINDINGS: Lungs are hyperexpanded with chronic scarring in the apices. No evidence of pneumonia or pulmonary edema. No pleural effusion or pneumothorax.  Cardiac silhouette is normal in size and configuration. No mediastinal or hilar masses or evidence of adenopathy.  Bony thorax is intact.  IMPRESSION: 1. No acute cardiopulmonary disease. 2. COPD with stable upper lobe scarring.    Assessment and Plan: 65 year old female past medical history of COPD/emphysema, hyperlipidemia, seen in consultation for COPD optimization. COPD (chronic obstructive pulmonary disease) with emphysema (HCC) Issue or clinical signs and symptoms of COPD, emphysema. Based on clinical symptoms she is most likely a stage II COPD further obstructive  classification will be determined upon pulmonary function testing. Patient advised on quitting tobacco. Patient advised on starting her Chantix. The patient advised on avoidance of any type of secondhand smoke exposure, avoid the cigarettes, avoid any type of vapors or using vapors.  Plan: -Patient needs criteria for lung cancer screening, we'll referred to a lung cancer screening program -Continue with Breo 100 g, 1 puff daily gargle and rinse after each use, -Continue with rescue inhaler as directed -Avoid all forms of tobacco and vapors -Avoid all forms of secondhand smoke -Diet and exercise as tolerated.  Tobacco use disorder Tobacco Cessation - Counseling regarding benefits of smoking cessation strategies was provided for more than 12 min. - Educated that at this time smoking- cessation represents the single most important step that patient can take to enhance the length and quality of live. - Educated patient regarding alternatives of behavior interventions, pharmacotherapy including NRT and non-nicotine therapy such, and combinations of both. - Patient at this time: Is a prescription for Chantix, her son recently passed away, and stated that she will start her Chantix soon   Encounter to establish care She presents  today to encounter establish care for a pulmonologist given her prolonged tobacco abuse, emphysema, shortness of breath, history of pneumonias, lung scarring.  Pulmonary scarring (HCC) Related to COPD/emphysema, continued tobacco abuse. Chest x-rays revealed patient, she has chronic lung scarring.  Plan: -Patient is a suitable candidate for lung cancer screening, and [facility] for lung cancer screening CT evaluation  Dyspnea Multifactorial: COPD/emphysema, tobacco abuse, deconditioning  Plan: -Pulmonary function tests, 6 minute walk test -Avoid all forms of tobacco -Exercise as tolerated    Updated Medication  List Outpatient Encounter Prescriptions as of 02/07/2016  Medication Sig  . albuterol (PROAIR HFA) 108 (90 Base) MCG/ACT inhaler Inhale 2 puffs into the lungs every 6 (six) hours as needed for wheezing or shortness of breath.  . fluticasone furoate-vilanterol (BREO ELLIPTA) 100-25 MCG/INH AEPB Inhale 1 puff into the lungs daily.  . simvastatin (ZOCOR) 20 MG tablet Take 1 tablet (20 mg total) by mouth daily.  . CHANTIX STARTING MONTH PAK 0.5 MG X 11 & 1 MG X 42 tablet See admin instructions. Reported on 02/07/2016   No facility-administered encounter medications on file as of 02/07/2016.    Orders for this visit: Orders Placed This Encounter  Procedures  . Pulmonary function test    Standing Status: Future     Number of Occurrences:      Standing Expiration Date: 02/06/2017    Order Specific Question:  Where should this test be performed?    Answer:  [facility]    Order Specific Question:  Full PFT: includes the following: basic spirometry, spirometry pre & post bronchodilator, diffusion capacity (DLCO), lung volumes    Answer:  Full PFT  . 6 minute walk    Standing Status: Future     Number of Occurrences:      Standing Expiration Date: 02/06/2017    Order Specific Question:  Where should this test be performed?    Answer:  Other     Thank  you for the consultation and for allowing [facility] to assist in the care of your patient. Our recommendations are noted above.  Please contact us if we can be of further service.   Stephanie AcreVishal Yeny Schmoll, MD [facility] Office Number: [phone]  Note: This note was prepared with Dragon dictation along with smaller phrase technology. Any transcriptional errors that result from this process are unintentional.

## 2016-02-09 ENCOUNTER — Ambulatory Visit (INDEPENDENT_AMBULATORY_CARE_PROVIDER_SITE_OTHER): Payer: Managed Care, Other (non HMO) | Admitting: Family Medicine

## 2016-02-09 ENCOUNTER — Encounter: Payer: Self-pay | Admitting: Family Medicine

## 2016-02-09 VITALS — BP 126/82 | HR 77 | Temp 98.3°F | Ht 62.0 in | Wt 103.4 lb

## 2016-02-09 DIAGNOSIS — J432 Centrilobular emphysema: Secondary | ICD-10-CM | POA: Diagnosis not present

## 2016-02-09 DIAGNOSIS — E785 Hyperlipidemia, unspecified: Secondary | ICD-10-CM | POA: Insufficient documentation

## 2016-02-09 LAB — LIPID PANEL
Cholesterol: 192 mg/dL (ref 0–200)
HDL: 55.7 mg/dL (ref >39.00)
LDL Cholesterol: 121 mg/dL — ABNORMAL HIGH (ref 0–99)
NonHDL: 136.75
Total CHOL/HDL Ratio: 3
Triglycerides: 77 mg/dL (ref 0.0–149.0)
VLDL: 15.4 mg/dL (ref 0.0–40.0)

## 2016-02-09 NOTE — Assessment & Plan Note (Signed)
Established problem, stable. Continue Breo.

## 2016-02-09 NOTE — Progress Notes (Signed)
   Subjective:  Patient ID: Alejandra FraiseMartha E Graw, female    DOB: Nov 23, 1950  Age: 65 y.o. MRN: [id number]  CC: Follow up  HPI:  65 year old female with COPD, HLD, Tobacco abuse presents for follow up.  COPD  Stable on Breo Ellipta.  Patient endorses compliance.  Followed by pulm.   Needs CT lung cancer screening (which is being set up).  HLD  Uncontrolled.  Recently started on Zocor.   In need of lipid panel to check for response.  Social Hx   Social History   Social History  . Marital Status: Married    Spouse Name: N/A  . Number of Children: N/A  . Years of Education: N/A   Social History Main Topics  . Smoking status: Current Every Day Smoker -- 1.00 packs/day for 48 years    Types: Cigarettes  . Smokeless tobacco: Never Used  . Alcohol Use: No  . Drug Use: No  . Sexual Activity: No   Other Topics Concern  . 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   Respiratory: Positive for shortness of breath.    Objective:  BP 126/82 mmHg  Pulse 77  Temp(Src) 98.3 F (36.8 C) (Oral)  Ht 5\' 2"  (1.575 m)  Wt 103 lb 6 oz (46.891 kg)  BMI 18.90 kg/m2  SpO2 93%  BP/Weight 02/09/2016 02/07/2016 11/28/2015  Systolic BP 126 120 112  Diastolic BP 82 62 68  Wt. (Lbs) 103.38 103.2 106.25  BMI 18.9 18.87 19.43   Physical Exam  Constitutional: She is oriented to person, place, and time.  Thin, chronically ill appearing female; NAD.  Cardiovascular: Normal rate and regular rhythm.   Pulmonary/Chest: Effort normal. She has no wheezes. She has no rales.  Neurological: She is alert and oriented to person, place, and time.  Psychiatric: She has a normal mood and affect.  Vitals reviewed.  Lab Results  Component Value Date   WBC  7.0 11/28/2015   HGB 13.6 11/28/2015   HCT 40.6 11/28/2015   PLT 251.0 11/28/2015   GLUCOSE 100* 11/28/2015   CHOL 192 02/09/2016   TRIG 77.0 02/09/2016   HDL 55.70 02/09/2016   LDLCALC 121* 02/09/2016   ALT 7 11/28/2015   AST 11 11/28/2015   NA 139 11/28/2015   K 4.4 11/28/2015   CL 103 11/28/2015   CREATININE 0.95 11/28/2015   BUN 13 11/28/2015   CO2 29 11/28/2015   TSH 2.92 11/28/2015   HGBA1C 5.4 11/17/2014   Assessment & Plan:   Problem List Items Addressed This Visit    COPD (chronic obstructive pulmonary disease) with emphysema (HCC) - Primary    Established problem, stable. Continue Breo.      Hyperlipidemia    Established problem, Uncontrolled. Lipid panel today revealed LDL of 121. Will discuss increase Zocor vs switching to Lipitor.       Relevant Orders   Lipid Profile (Completed)     Follow-up: 6 months  Semiyah Newgent Adriana Simasook DO [facility] [place]

## 2016-02-09 NOTE — Assessment & Plan Note (Signed)
Established problem, Uncontrolled. Lipid panel today revealed LDL of 121. Will discuss increase Zocor vs switching to Lipitor.

## 2016-02-09 NOTE — Progress Notes (Signed)
Pre visit review using our clinic review tool, if applicable. No additional management support is needed unless otherwise documented below in the visit note. 

## 2016-02-09 NOTE — Patient Instructions (Signed)
We will call with your lab results.  Consider taking a daily aspirin.  Go ahead and take that chantix.  Follow up in 6 months.  Take care  Dr. Adriana Simasook

## 2016-02-14 ENCOUNTER — Telehealth: Payer: Self-pay | Admitting: *Deleted

## 2016-02-14 ENCOUNTER — Other Ambulatory Visit: Payer: Self-pay | Admitting: Family Medicine

## 2016-02-14 MED ORDER — ATORVASTATIN CALCIUM 40 MG PO TABS: 40.0000 mg | ORAL_TABLET | Freq: Every day | ORAL | Status: DC

## 2016-02-14 NOTE — Telephone Encounter (Signed)
Received referral for initial lung cancer screening scan. Contacted patient and obtained smoking history,(current, 72 pack year history) as well as answering questions related to screening process. Patient denies signs of lung cancer such as weight loss or hemoptysis. Patient denies comorbidity that would prevent curative treatment if lung cancer were found. Patient is tentatively scheduled for shared decision making visit and CT scan on 03/02/16 at 2pm, pending insurance approval from business office.

## 2016-03-01 ENCOUNTER — Other Ambulatory Visit: Payer: Self-pay | Admitting: Family Medicine

## 2016-03-01 DIAGNOSIS — Z87891 Personal history of nicotine dependence: Secondary | ICD-10-CM | POA: Insufficient documentation

## 2016-03-02 ENCOUNTER — Encounter: Payer: Self-pay | Admitting: Family Medicine

## 2016-03-02 ENCOUNTER — Inpatient Hospital Stay: Payer: Managed Care, Other (non HMO) | Attending: Family Medicine | Admitting: Family Medicine

## 2016-03-02 ENCOUNTER — Ambulatory Visit
Admission: RE | Admit: 2016-03-02 | Discharge: 2016-03-02 | Disposition: A | Discharge status: Home / Self Care | Payer: Managed Care, Other (non HMO) | Source: Ambulatory Visit | Attending: Family Medicine | Admitting: Family Medicine

## 2016-03-02 DIAGNOSIS — Z87891 Personal history of nicotine dependence: Secondary | ICD-10-CM

## 2016-03-02 DIAGNOSIS — I700 Atherosclerosis of aorta: Secondary | ICD-10-CM | POA: Diagnosis not present

## 2016-03-02 DIAGNOSIS — I2510 Atherosclerotic heart disease of native coronary artery without angina pectoris: Secondary | ICD-10-CM | POA: Insufficient documentation

## 2016-03-02 DIAGNOSIS — Z122 Encounter for screening for malignant neoplasm of respiratory organs: Secondary | ICD-10-CM

## 2016-03-02 DIAGNOSIS — J439 Emphysema, unspecified: Secondary | ICD-10-CM | POA: Insufficient documentation

## 2016-03-02 NOTE — Progress Notes (Signed)
In accordance with CMS guidelines, patient has meet eligibility criteria including age, absence of signs or symptoms of lung cancer, the specific calculation of cigarette smoking pack-years was 72 years and is a current smoker.   A shared decision-making session was conducted prior to the performance of CT scan. This includes one or more decision aids, includes benefits and harms of screening, follow-up diagnostic testing, over-diagnosis, false positive rate, and total radiation exposure.  Counseling on the importance of adherence to annual lung cancer LDCT screening, impact of co-morbidities, and ability or willingness to undergo diagnosis and treatment is imperative for compliance of the program.  Counseling on the importance of continued smoking cessation for former smokers; the importance of smoking cessation for current smokers and information about tobacco cessation interventions have been given to patient including the [place] at [facility], 1800 quit , as well as Cancer Center specific smoking cessation programs.  Written order for lung cancer screening with LDCT has been given to the patient and any and all questions have been answered to the best of my abilities.   Yearly follow up will be scheduled by Glenna FellowsShawn Perkins, Thoracic Navigator.

## 2016-03-05 ENCOUNTER — Telehealth: Payer: Self-pay | Admitting: *Deleted

## 2016-03-05 NOTE — Telephone Encounter (Signed)
Notified patient of LDCT lung cancer screening results with recommendation for 3 month follow up imaging vs more urgent workup. Also notified of incidental finding noted below. Informed patient that Dr. Dema SeverinMungal and I have discussed results and will be presenting case at weekly thoracic conference. Patient verbalizes understanding. Will follow up with patient after thoracic conference.   IMPRESSION: 1. Lung-RADS Category 4A, suspicious. Follow up low-dose chest CT without contrast in 3 months (please use the following order, "CT CHEST LCS NODULE FOLLOW-UP W/O CM") is recommended. Alternatively, PET may be considered when there is a solid component 8mm or larger. 2. Emphysema 3. Aortic atherosclerosis and coronary artery calcification.

## 2016-04-12 ENCOUNTER — Telehealth: Payer: Self-pay | Admitting: *Deleted

## 2016-04-12 NOTE — Telephone Encounter (Signed)
Note, patient was given recommendation for 3 month follow up imaging from conference discussion. Patient is agreeable to this plan.

## 2016-05-09 ENCOUNTER — Ambulatory Visit: Payer: Managed Care, Other (non HMO)

## 2016-05-09 ENCOUNTER — Encounter: Payer: Self-pay | Admitting: *Deleted

## 2016-05-11 ENCOUNTER — Encounter: Payer: Self-pay | Admitting: *Deleted

## 2016-05-11 ENCOUNTER — Ambulatory Visit: Payer: Managed Care, Other (non HMO) | Admitting: Internal Medicine

## 2016-08-10 ENCOUNTER — Ambulatory Visit: Payer: Managed Care, Other (non HMO) | Admitting: Family Medicine

## 2016-08-10 DIAGNOSIS — Z0289 Encounter for other administrative examinations: Secondary | ICD-10-CM

## 2016-11-19 ENCOUNTER — Encounter: Payer: Self-pay | Admitting: Family Medicine

## 2016-11-19 ENCOUNTER — Ambulatory Visit (INDEPENDENT_AMBULATORY_CARE_PROVIDER_SITE_OTHER): Payer: Managed Care, Other (non HMO) | Admitting: Family Medicine

## 2016-11-19 VITALS — BP 114/60 | HR 69 | Temp 98.4°F | Ht 63.0 in | Wt 100.5 lb

## 2016-11-19 DIAGNOSIS — Z23 Encounter for immunization: Secondary | ICD-10-CM

## 2016-11-19 DIAGNOSIS — Z1159 Encounter for screening for other viral diseases: Secondary | ICD-10-CM

## 2016-11-19 DIAGNOSIS — E2839 Other primary ovarian failure: Secondary | ICD-10-CM

## 2016-11-19 DIAGNOSIS — Z0000 Encounter for general adult medical examination without abnormal findings: Secondary | ICD-10-CM

## 2016-11-19 LAB — CBC
HCT: 38.4 % (ref 36.0–46.0)
Hemoglobin: 12.9 g/dL (ref 12.0–15.0)
MCHC: 33.6 g/dL (ref 30.0–36.0)
MCV: 98.7 fl (ref 78.0–100.0)
Platelets: 234 10*3/uL (ref 150.0–400.0)
RBC: 3.9 Mil/uL (ref 3.87–5.11)
RDW: 12.6 % (ref 11.5–15.5)
WBC: 6.7 10*3/uL (ref 4.0–10.5)

## 2016-11-19 LAB — COMPREHENSIVE METABOLIC PANEL
ALT: 10 U/L (ref 0–35)
AST: 14 U/L (ref 0–37)
Albumin: 3.8 g/dL (ref 3.5–5.2)
Alkaline Phosphatase: 50 U/L (ref 39–117)
BUN: 15 mg/dL (ref 6–23)
CO2: 30 mEq/L (ref 19–32)
Calcium: 9.4 mg/dL (ref 8.4–10.5)
Chloride: 106 mEq/L (ref 96–112)
Creatinine, Ser: 0.96 mg/dL (ref 0.40–1.20)
GFR: 61.57 mL/min (ref >60.00)
Glucose, Bld: 97 mg/dL (ref 70–99)
Potassium: 4.2 mEq/L (ref 3.5–5.1)
Sodium: 139 mEq/L (ref 135–145)
Total Bilirubin: 0.4 mg/dL (ref 0.2–1.2)
Total Protein: 6.6 g/dL (ref 6.0–8.3)

## 2016-11-19 LAB — LIPID PANEL
Cholesterol: 192 mg/dL (ref 0–200)
HDL: 62.5 mg/dL (ref >39.00)
LDL Cholesterol: 114 mg/dL — ABNORMAL HIGH (ref 0–99)
NonHDL: 129.9
Total CHOL/HDL Ratio: 3
Triglycerides: 82 mg/dL (ref 0.0–149.0)
VLDL: 16.4 mg/dL (ref 0.0–40.0)

## 2016-11-19 MED ORDER — BUPROPION HCL ER (XL) 150 MG PO TB24: ORAL_TABLET | ORAL | 1 refills | Status: DC

## 2016-11-19 MED ORDER — ATORVASTATIN CALCIUM 40 MG PO TABS: 40.0000 mg | ORAL_TABLET | Freq: Every day | ORAL | 3 refills | Status: DC

## 2016-11-19 NOTE — Progress Notes (Signed)
Subjective:  Patient ID: Alejandra Ortiz, female    DOB: Jan 12, 1951  Age: 66 y.o. MRN: [id number]  CC: Annual physical exam  HPI Alejandra Ortiz is a 66 y.o. female with COPD, tobacco abuse, Hyperlipidemia presents for an annual physical exam.  Preventative Healthcare  Pap smear: No longer indicated.   Mammogram: Declines.  Colonoscopy: Declines. Also declines Cologuard.  Immunizations  Tetanus - Up to date.  Pneumococcal - In need of PCV 23 today.  Flu - Up to date.  Zoster - Declines.  Hepatitis C screening - Screening today.  Labs: Labs today.  Smoking/tobacco use: Will discuss today.  PMH, Surgical Hx, Family Hx, Social History reviewed and updated as below.  Past Medical History:  Diagnosis Date  . Chicken pox   . COPD (chronic obstructive pulmonary disease) (HCC)   . Hyperlipidemia    Past Surgical History:  Procedure Laterality Date  . TUBAL LIGATION  1978   Family History  Problem Relation Age of Onset  . Family history unknown: Yes   Social History  Substance Use Topics  . Smoking status: Current Every Day Smoker    Packs/day: 1.50    Years: 48.00    Types: Cigarettes  . Smokeless tobacco: Never Used  . Alcohol use No    Review of Systems General: Denies unexplained weight loss, fever. Skin: Denies new or changing mole, sore/wound that won't heal. ENT: Trouble hearing, ringing in the ears, sores in the mouth, hoarseness, trouble swallowing. Eyes: Denies trouble seeing/visual disturbance. Heart/CV: Denies chest pain, shortness of breath, edema, palpitations. Lungs/Resp: Denies cough, shortness of breath, hemoptysis. Abd/GI: Denies nausea, vomiting, diarrhea, constipation, abdominal pain, hematochezia, melena. GU: Denies dysuria, incontinence, hematuria, urinary frequency, difficulty starting/keeping stream, vaginal discharge, sexual difficulty, lump in breasts. MSK: Denies joint pain/swelling, myalgias. Neuro: Denies headaches, weakness,  numbness, dizziness, syncope. Psych: Denies sadness, anxiety, stress, memory difficulty. Endocrine: Denies polyuria and polydipsia.  Objective:   Today's Vitals: BP 114/60   Pulse 69   Temp 98.4 F (36.9 C) (Oral)   Ht 5\' 3"  (1.6 m)   Wt 100 lb 8 oz (45.6 kg)   SpO2 96%   BMI 17.80 kg/m   Physical Exam  Constitutional: She is oriented to person, place, and time.  Thin elderly female in no acute distress.  HENT:  Head: Normocephalic and atraumatic.  Mouth/Throat: Oropharynx is clear and moist.  Eyes: Conjunctivae are normal.  Neck: Neck supple.  Cardiovascular: Normal rate and regular rhythm.   Pulmonary/Chest: Effort normal.  No adventitious breath sounds appreciated.  Abdominal: Soft. She exhibits no distension. There is no tenderness.  Musculoskeletal: Normal range of motion.  Neurological: She is alert and oriented to person, place, and time.  Skin: No rash noted.  Psychiatric: She has a normal mood and affect.  Vitals reviewed.   Assessment & Plan:   Problem List Items Addressed This Visit    Annual physical exam - Primary    Pap smear no longer needed. Declines mammogram, colonoscopy, shingles vaccine. Pneumococcal vaccine given today. Labs today including hepatitis C. Arranging bone density. Starting on Wellbutrin for smoking cessation.      Relevant Orders   CBC (Completed)   Comprehensive metabolic panel (Completed)   Lipid panel (Completed)    Other Visit Diagnoses    Need for hepatitis C screening test       Relevant Orders   Hepatitis C Antibody   Need for 23-polyvalent pneumococcal polysaccharide vaccine       Relevant  Orders   Pneumococcal polysaccharide vaccine 23-valent greater than or equal to 2yo subcutaneous/IM (Completed)   Estrogen deficiency       Relevant Orders   DG BONE DENSITY (DXA)      Meds ordered this encounter  Medications  . atorvastatin (LIPITOR) 40 MG tablet    Sig: Take 1 tablet (40 mg total) by mouth daily.     Dispense:  90 tablet    Refill:  3  . buPROPion (WELLBUTRIN XL) 150 MG 24 hr tablet    Sig: 150 mg daily for 3 days. Then increase to 300 mg daily.    Dispense:  60 tablet    Refill:  1     Follow-up: Annually  Everlene OtherJayce Calise Dunckel DO [facility] [place]

## 2016-11-19 NOTE — Progress Notes (Signed)
Pre visit review using our clinic review tool, if applicable. No additional management support is needed unless otherwise documented below in the visit note. 

## 2016-11-19 NOTE — Assessment & Plan Note (Signed)
Pap smear no longer needed. Declines mammogram, colonoscopy, shingles vaccine. Pneumococcal vaccine given today. Labs today including hepatitis C. Arranging bone density. Starting on Wellbutrin for smoking cessation.

## 2016-11-19 NOTE — Patient Instructions (Signed)
Wellbutrin as prescribed. Therapy should begin at least 1 week before target quit date. Target quit dates are generally in the second week of treatment.  Take the lipitor.  Follow up annually.  Take care  Dr. Lacinda Axon   Health Maintenance, Female Adopting a healthy lifestyle and getting preventive care can go a long way to promote health and wellness. Talk with your health care provider about what schedule of regular examinations is right for you. This is a good chance for you to check in with your provider about disease prevention and staying healthy. In between checkups, there are plenty of things you can do on your own. Experts have done a lot of research about which lifestyle changes and preventive measures are most likely to keep you healthy. Ask your health care provider for more information. Weight and diet Eat a healthy diet  Be sure to include plenty of vegetables, fruits, low-fat dairy products, and lean protein.  Do not eat a lot of foods high in solid fats, added sugars, or salt.  Get regular exercise. This is one of the most important things you can do for your health.  Most adults should exercise for at least 150 minutes each week. The exercise should increase your heart rate and make you sweat (moderate-intensity exercise).  Most adults should also do strengthening exercises at least twice a week. This is in addition to the moderate-intensity exercise. Maintain a healthy weight  Body mass index (BMI) is a measurement that can be used to identify possible weight problems. It estimates body fat based on height and weight. Your health care provider can help determine your BMI and help you achieve or maintain a healthy weight.  For females 46 years of age and older:  A BMI below 18.5 is considered underweight.  A BMI of 18.5 to 24.9 is normal.  A BMI of 25 to 29.9 is considered overweight.  A BMI of 30 and above is considered obese. Watch levels of cholesterol and blood  lipids  You should start having your blood tested for lipids and cholesterol at 68 years of age, then have this test every 5 years.  You may need to have your cholesterol levels checked more often if:  Your lipid or cholesterol levels are high.  You are older than 68 years of age.  You are at high risk for heart disease. Cancer screening Lung Cancer  Lung cancer screening is recommended for adults 4-41 years old who are at high risk for lung cancer because of a history of smoking.  A yearly low-dose CT scan of the lungs is recommended for people who:  Currently smoke.  Have quit within the past 15 years.  Have at least a 30-pack-year history of smoking. A pack year is smoking an average of one pack of cigarettes a day for 1 year.  Yearly screening should continue until it has been 15 years since you quit.  Yearly screening should stop if you develop a health problem that would prevent you from having lung cancer treatment. Breast Cancer  Practice breast self-awareness. This means understanding how your breasts normally appear and feel.  It also means doing regular breast self-exams. Let your health care provider know about any changes, no matter how small.  If you are in your 20s or 30s, you should have a clinical breast exam (CBE) by a health care provider every 1-3 years as part of a regular health exam.  If you are 60 or older, have a CBE  every year. Also consider having a breast X-ray (mammogram) every year.  If you have a family history of breast cancer, talk to your health care provider about genetic screening.  If you are at high risk for breast cancer, talk to your health care provider about having an MRI and a mammogram every year.  Breast cancer gene (BRCA) assessment is recommended for women who have family members with BRCA-related cancers. BRCA-related cancers include:  Breast.  Ovarian.  Tubal.  Peritoneal cancers.  Results of the assessment will  determine the need for genetic counseling and BRCA1 and BRCA2 testing. Cervical Cancer  Your health care provider may recommend that you be screened regularly for cancer of the pelvic organs (ovaries, uterus, and vagina). This screening involves a pelvic examination, including checking for microscopic changes to the surface of your cervix (Pap test). You may be encouraged to have this screening done every 3 years, beginning at age 43.  For women ages 66-65, health care providers may recommend pelvic exams and Pap testing every 3 years, or they may recommend the Pap and pelvic exam, combined with testing for human papilloma virus (HPV), every 5 years. Some types of HPV increase your risk of cervical cancer. Testing for HPV may also be done on women of any age with unclear Pap test results.  Other health care providers may not recommend any screening for nonpregnant women who are considered low risk for pelvic cancer and who do not have symptoms. Ask your health care provider if a screening pelvic exam is right for you.  If you have had past treatment for cervical cancer or a condition that could lead to cancer, you need Pap tests and screening for cancer for at least 20 years after your treatment. If Pap tests have been discontinued, your risk factors (such as having a new sexual partner) need to be reassessed to determine if screening should resume. Some women have medical problems that increase the chance of getting cervical cancer. In these cases, your health care provider may recommend more frequent screening and Pap tests. Colorectal Cancer  This type of cancer can be detected and often prevented.  Routine colorectal cancer screening usually begins at 68 years of age and continues through 68 years of age.  Your health care provider may recommend screening at an earlier age if you have risk factors for colon cancer.  Your health care provider may also recommend using home test kits to check for  hidden blood in the stool.  A small camera at the end of a tube can be used to examine your colon directly (sigmoidoscopy or colonoscopy). This is done to check for the earliest forms of colorectal cancer.  Routine screening usually begins at age 89.  Direct examination of the colon should be repeated every 5-10 years through 68 years of age. However, you may need to be screened more often if early forms of precancerous polyps or small growths are found. Skin Cancer  Check your skin from head to toe regularly.  Tell your health care provider about any new moles or changes in moles, especially if there is a change in a mole's shape or color.  Also tell your health care provider if you have a mole that is larger than the size of a pencil eraser.  Always use sunscreen. Apply sunscreen liberally and repeatedly throughout the day.  Protect yourself by wearing long sleeves, pants, a wide-brimmed hat, and sunglasses whenever you are outside. Heart disease, diabetes, and high blood  pressure  High blood pressure causes heart disease and increases the risk of stroke. High blood pressure is more likely to develop in:  People who have blood pressure in the high end of the normal range (130-139/85-89 mm Hg).  People who are overweight or obese.  People who are African American.  If you are 54-14 years of age, have your blood pressure checked every 3-5 years. If you are 19 years of age or older, have your blood pressure checked every year. You should have your blood pressure measured twice-once when you are at a hospital or clinic, and once when you are not at a hospital or clinic. Record the average of the two measurements. To check your blood pressure when you are not at a hospital or clinic, you can use:  An automated blood pressure machine at a pharmacy.  A home blood pressure monitor.  If you are between 37 years and 66 years old, ask your health care provider if you should take aspirin to  prevent strokes.  Have regular diabetes screenings. This involves taking a blood sample to check your fasting blood sugar level.  If you are at a normal weight and have a low risk for diabetes, have this test once every three years after 68 years of age.  If you are overweight and have a high risk for diabetes, consider being tested at a younger age or more often. Preventing infection Hepatitis B  If you have a higher risk for hepatitis B, you should be screened for this virus. You are considered at high risk for hepatitis B if:  You were born in a country where hepatitis B is common. Ask your health care provider which countries are considered high risk.  Your parents were born in a high-risk country, and you have not been immunized against hepatitis B (hepatitis B vaccine).  You have HIV or AIDS.  You use needles to inject street drugs.  You live with someone who has hepatitis B.  You have had sex with someone who has hepatitis B.  You get hemodialysis treatment.  You take certain medicines for conditions, including cancer, organ transplantation, and autoimmune conditions. Hepatitis C  Blood testing is recommended for:  Everyone born from [birth year] through 1967.  Anyone with known risk factors for hepatitis C. Sexually transmitted infections (STIs)  You should be screened for sexually transmitted infections (STIs) including gonorrhea and chlamydia if:  You are sexually active and are younger than 68 years of age.  You are older than 68 years of age and your health care provider tells you that you are at risk for this type of infection.  Your sexual activity has changed since you were last screened and you are at an increased risk for chlamydia or gonorrhea. Ask your health care provider if you are at risk.  If you do not have HIV, but are at risk, it may be recommended that you take a prescription medicine daily to prevent HIV infection. This is called pre-exposure  prophylaxis (PrEP). You are considered at risk if:  You are sexually active and do not regularly use condoms or know the HIV status of your partner(s).  You take drugs by injection.  You are sexually active with a partner who has HIV. Talk with your health care provider about whether you are at high risk of being infected with HIV. If you choose to begin PrEP, you should first be tested for HIV. You should then be tested every 3 months for  as long as you are taking PrEP. Pregnancy  If you are premenopausal and you may become pregnant, ask your health care provider about preconception counseling.  If you may become pregnant, take 400 to 800 micrograms (mcg) of folic acid every day.  If you want to prevent pregnancy, talk to your health care provider about birth control (contraception). Osteoporosis and menopause  Osteoporosis is a disease in which the bones lose minerals and strength with aging. This can result in serious bone fractures. Your risk for osteoporosis can be identified using a bone density scan.  If you are 19 years of age or older, or if you are at risk for osteoporosis and fractures, ask your health care provider if you should be screened.  Ask your health care provider whether you should take a calcium or vitamin D supplement to lower your risk for osteoporosis.  Menopause may have certain physical symptoms and risks.  Hormone replacement therapy may reduce some of these symptoms and risks. Talk to your health care provider about whether hormone replacement therapy is right for you. Follow these instructions at home:  Schedule regular health, dental, and eye exams.  Stay current with your immunizations.  Do not use any tobacco products including cigarettes, chewing tobacco, or electronic cigarettes.  If you are pregnant, do not drink alcohol.  If you are breastfeeding, limit how much and how often you drink alcohol.  Limit alcohol intake to no more than 1 drink  per day for nonpregnant women. One drink equals 12 ounces of beer, 5 ounces of wine, or 1 ounces of hard liquor.  Do not use street drugs.  Do not share needles.  Ask your health care provider for help if you need support or information about quitting drugs.  Tell your health care provider if you often feel depressed.  Tell your health care provider if you have ever been abused or do not feel safe at home. This information is not intended to replace advice given to you by your health care provider. Make sure you discuss any questions you have with your health care provider. Document Released: 02/26/2011 Document Revised: 01/19/2016 Document Reviewed: 05/17/2015 Elsevier Interactive Patient Education  2017 [employer].

## 2016-11-20 LAB — HEPATITIS C ANTIBODY: HCV Ab: NEGATIVE

## 2016-11-28 ENCOUNTER — Encounter: Payer: Managed Care, Other (non HMO) | Admitting: Nurse Practitioner

## 2016-12-17 ENCOUNTER — Telehealth: Payer: Self-pay | Admitting: *Deleted

## 2016-12-17 DIAGNOSIS — R9389 Abnormal findings on diagnostic imaging of other specified body structures: Secondary | ICD-10-CM

## 2016-12-17 NOTE — Telephone Encounter (Signed)
Notified patient that cancer screening low dose CT scan follow up imaging is important and is due. Confirmed that patient is within the age range of 55-77, and asymptomatic, (no signs or symptoms of lung cancer). Patient denies illness that would prevent curative treatment for lung cancer if found. The patient is a current smoker, with a 48 pack year history. The shared decision making visit was done 03/02/16. Patient is agreeable for CT scan being scheduled. Note CT has been delayed due to difficulty contacting patient.

## 2016-12-18 ENCOUNTER — Telehealth: Payer: Self-pay

## 2016-12-18 NOTE — Telephone Encounter (Signed)
NO answer and NO  VM set up , letter mailed out with appt details

## 2016-12-19 ENCOUNTER — Encounter: Payer: Self-pay | Admitting: Family Medicine

## 2016-12-24 ENCOUNTER — Ambulatory Visit
Admission: RE | Admit: 2016-12-24 | Discharge: 2016-12-24 | Disposition: A | Discharge status: Home / Self Care | Payer: Managed Care, Other (non HMO) | Source: Ambulatory Visit | Attending: Oncology | Admitting: Oncology

## 2016-12-24 DIAGNOSIS — I2510 Atherosclerotic heart disease of native coronary artery without angina pectoris: Secondary | ICD-10-CM | POA: Insufficient documentation

## 2016-12-24 DIAGNOSIS — I700 Atherosclerosis of aorta: Secondary | ICD-10-CM | POA: Diagnosis not present

## 2016-12-24 DIAGNOSIS — F17200 Nicotine dependence, unspecified, uncomplicated: Secondary | ICD-10-CM | POA: Insufficient documentation

## 2016-12-24 DIAGNOSIS — R938 Abnormal findings on diagnostic imaging of other specified body structures: Secondary | ICD-10-CM | POA: Diagnosis present

## 2016-12-24 DIAGNOSIS — J439 Emphysema, unspecified: Secondary | ICD-10-CM | POA: Diagnosis not present

## 2016-12-24 DIAGNOSIS — R9389 Abnormal findings on diagnostic imaging of other specified body structures: Secondary | ICD-10-CM

## 2016-12-24 DIAGNOSIS — Z122 Encounter for screening for malignant neoplasm of respiratory organs: Secondary | ICD-10-CM | POA: Insufficient documentation

## 2016-12-25 ENCOUNTER — Ambulatory Visit: Admission: RE | Admit: 2016-12-25 | Payer: Managed Care, Other (non HMO) | Source: Ambulatory Visit

## 2016-12-28 ENCOUNTER — Encounter: Payer: Self-pay | Admitting: *Deleted

## 2017-01-24 ENCOUNTER — Ambulatory Visit: Payer: Managed Care, Other (non HMO) | Attending: Family Medicine

## 2017-02-01 ENCOUNTER — Other Ambulatory Visit: Payer: Self-pay | Admitting: Nurse Practitioner

## 2017-12-12 ENCOUNTER — Telehealth: Payer: Self-pay | Admitting: *Deleted

## 2017-12-12 DIAGNOSIS — Z122 Encounter for screening for malignant neoplasm of respiratory organs: Secondary | ICD-10-CM

## 2017-12-12 DIAGNOSIS — Z87891 Personal history of nicotine dependence: Secondary | ICD-10-CM

## 2017-12-12 NOTE — Telephone Encounter (Signed)
Notified patient that annual lung cancer screening low dose CT scan is due currently or will be in near future. Confirmed that patient is within the age range of 55-77, and asymptomatic, (no signs or symptoms of lung cancer). Patient denies illness that would prevent curative treatment for lung cancer if found. Verified smoking history, (current, 74 pack year). The shared decision making visit was done 03/02/16. Patient is agreeable for CT scan being scheduled.

## 2017-12-23 ENCOUNTER — Ambulatory Visit
Admission: RE | Admit: 2017-12-23 | Discharge: 2017-12-23 | Disposition: A | Discharge status: Home / Self Care | Payer: Medicare Other | Source: Ambulatory Visit | Attending: Oncology | Admitting: Oncology

## 2017-12-23 DIAGNOSIS — J432 Centrilobular emphysema: Secondary | ICD-10-CM | POA: Diagnosis not present

## 2017-12-23 DIAGNOSIS — I2510 Atherosclerotic heart disease of native coronary artery without angina pectoris: Secondary | ICD-10-CM | POA: Diagnosis not present

## 2017-12-23 DIAGNOSIS — I700 Atherosclerosis of aorta: Secondary | ICD-10-CM | POA: Diagnosis not present

## 2017-12-23 DIAGNOSIS — J438 Other emphysema: Secondary | ICD-10-CM | POA: Diagnosis not present

## 2017-12-23 DIAGNOSIS — Z122 Encounter for screening for malignant neoplasm of respiratory organs: Secondary | ICD-10-CM | POA: Diagnosis present

## 2017-12-23 DIAGNOSIS — Z87891 Personal history of nicotine dependence: Secondary | ICD-10-CM | POA: Insufficient documentation

## 2017-12-31 ENCOUNTER — Encounter: Payer: Self-pay | Admitting: *Deleted

## 2018-05-19 ENCOUNTER — Emergency Department: Payer: No Typology Code available for payment source

## 2018-05-19 ENCOUNTER — Encounter: Payer: Self-pay | Admitting: Emergency Medicine

## 2018-05-19 ENCOUNTER — Emergency Department
Admission: EM | Admit: 2018-05-19 | Discharge: 2018-05-19 | Disposition: A | Discharge status: Home / Self Care | Payer: No Typology Code available for payment source | Attending: Emergency Medicine | Admitting: Emergency Medicine

## 2018-05-19 ENCOUNTER — Other Ambulatory Visit: Payer: Self-pay

## 2018-05-19 DIAGNOSIS — Z79899 Other long term (current) drug therapy: Secondary | ICD-10-CM | POA: Insufficient documentation

## 2018-05-19 DIAGNOSIS — Z23 Encounter for immunization: Secondary | ICD-10-CM | POA: Insufficient documentation

## 2018-05-19 DIAGNOSIS — Y990 Civilian activity done for income or pay: Secondary | ICD-10-CM | POA: Diagnosis not present

## 2018-05-19 DIAGNOSIS — E785 Hyperlipidemia, unspecified: Secondary | ICD-10-CM | POA: Insufficient documentation

## 2018-05-19 DIAGNOSIS — F17210 Nicotine dependence, cigarettes, uncomplicated: Secondary | ICD-10-CM | POA: Insufficient documentation

## 2018-05-19 DIAGNOSIS — Y9389 Activity, other specified: Secondary | ICD-10-CM | POA: Insufficient documentation

## 2018-05-19 DIAGNOSIS — Y9289 Other specified places as the place of occurrence of the external cause: Secondary | ICD-10-CM | POA: Diagnosis not present

## 2018-05-19 DIAGNOSIS — S61441A Puncture wound with foreign body of right hand, initial encounter: Secondary | ICD-10-CM | POA: Insufficient documentation

## 2018-05-19 DIAGNOSIS — W458XXA Other foreign body or object entering through skin, initial encounter: Secondary | ICD-10-CM | POA: Insufficient documentation

## 2018-05-19 DIAGNOSIS — J449 Chronic obstructive pulmonary disease, unspecified: Secondary | ICD-10-CM | POA: Insufficient documentation

## 2018-05-19 DIAGNOSIS — M795 Residual foreign body in soft tissue: Secondary | ICD-10-CM

## 2018-05-19 MED ORDER — LIDOCAINE HCL (PF) 1 % IJ SOLN
INTRAMUSCULAR | Status: AC
  Filled 2018-05-19: qty 5

## 2018-05-19 MED ORDER — AMOXICILLIN-POT CLAVULANATE 875-125 MG PO TABS: 1.0000 | ORAL_TABLET | Freq: Two times a day (BID) | ORAL | 0 refills | Status: AC

## 2018-05-19 MED ORDER — LIDOCAINE HCL 1 % IJ SOLN
5.0000 mL | Freq: Once | INTRAMUSCULAR | Status: AC
  Administered 2018-05-19: 5 mL
  Filled 2018-05-19: qty 5

## 2018-05-19 MED ORDER — TETANUS-DIPHTH-ACELL PERTUSSIS 5-2.5-18.5 LF-MCG/0.5 IM SUSP
0.5000 mL | Freq: Once | INTRAMUSCULAR | Status: AC
  Administered 2018-05-19: 0.5 mL via INTRAMUSCULAR
  Filled 2018-05-19: qty 0.5

## 2018-05-19 NOTE — ED Provider Notes (Signed)
[facility] Emergency Department Provider Note  ____________________________________________  Time seen: Approximately 7:06 PM  I have reviewed the triage vital signs and the nursing notes.   HISTORY  Chief Complaint Foreign Body in Skin    HPI Alejandra Ortiz is a 67 y.o. female presents to the emergency department with a knitting hook foreign body of the right hand.  Patient denies numbness and tingling.  Patient reports that she has not been able to remove foreign body herself due to pain.  Her tetanus status is out of date.  Injury occurred at work.   Past Medical History:  Diagnosis Date  . Chicken pox   . COPD (chronic obstructive pulmonary disease) (HCC)   . Hyperlipidemia     Patient Active Problem List   Diagnosis Date Noted  . Annual physical exam 11/19/2016  . Personal history of tobacco use, presenting hazards to health 03/01/2016  . Hyperlipidemia 02/09/2016  . 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 Pulmonary scarring 02/07/2016  . Tobacco use disorder 11/24/2014    Past Surgical History:  Procedure Laterality Date  . TUBAL LIGATION  1978    Prior to Admission medications   Medication Sig Start Date End Date Taking? Authorizing Provider  albuterol (PROAIR HFA) 108 (90 Base) MCG/ACT inhaler Inhale 2 puffs into the lungs every 6 (six) hours as needed for wheezing or shortness of breath. 08/31/15   Carollee Leitz, RN  amoxicillin-clavulanate (AUGMENTIN) 875-125 MG tablet Take 1 tablet by mouth 2 (two) times daily for 10 days. 05/19/18 05/29/18  Orvil Feil, PA-C  atorvastatin (LIPITOR) 40 MG tablet Take 1 tablet (40 mg total) by mouth daily. 11/19/16   Cook, Jayce G, DO  BREO ELLIPTA 100-25 MCG/INH AEPB INHALE 1 PUFF INTO THE LUNGS DAILY. 02/01/17   Tommie Sams, DO  buPROPion (WELLBUTRIN XL) 150 MG 24 hr tablet 150 mg daily for 3 days. Then increase to 300 mg daily. 11/19/16   Tommie Sams, DO     Allergies Patient has no known allergies.  Family History  Family history unknown: Yes    Social History Social History   Tobacco Use  . Smoking status: Current Every Day Smoker    Packs/day: 1.50    Years: 48.00    Pack years: 72.00    Types: Cigarettes  . Smokeless tobacco: Never Used  Substance Use Topics  . Alcohol use: No    Alcohol/week: 0.0 standard drinks  . Drug use: No     Review of Systems  Constitutional: No fever/chills Eyes: No visual changes. No discharge ENT: No upper respiratory complaints. Cardiovascular: no chest pain. Respiratory: no cough. No SOB. Gastrointestinal: No abdominal pain.  No nausea, no vomiting.  No diarrhea.  No constipation. Genitourinary: Negative for dysuria. No hematuria Musculoskeletal: Patient has right hand pain.  Skin: Negative for rash, abrasions, lacerations, ecchymosis. Neurological: Negative for headaches, focal weakness or numbness.  ____________________________________________   PHYSICAL EXAM:  VITAL SIGNS: ED Triage Vitals  Enc Vitals Group     BP 05/19/18 1731 (!) 142/60     Pulse Rate 05/19/18 1731 76     Resp 05/19/18 1731 18     Temp 05/19/18 1731 98.3 F (36.8 C)     Temp Source 05/19/18 1731 Oral     SpO2 05/19/18 1731 96 %     Weight 05/19/18 1732 104 lb (47.2 kg)     Height 05/19/18 1732 5\' 2"  (1.575 m)  Head Circumference --      Peak Flow --      Pain Score 05/19/18 1732 5     Pain Loc --      Pain Edu? --      Excl. in GC? --      Constitutional: Alert and oriented. Well appearing and in no acute distress. Eyes: Conjunctivae are normal. PERRL. EOMI. Head: Atraumatic. Cardiovascular: Normal rate, regular rhythm. Normal S1 and S2.  Good peripheral circulation. Respiratory: Normal respiratory effort without tachypnea or retractions. Lungs CTAB. Good air entry to the bases with no decreased or absent breath sounds. Musculoskeletal: Patient has 5 out of 5 strength in the upper  extremities.  Patient is able to make an okay sign.  She can spread her fingers.  She can perform flexion at the IP joint.  Knitting needle is situated along the lateral aspect of right thumb and has a one-way entry only.  Knitting needle is not in the distribution of flexor or extensor tendons or major vessels.  Palpable radial pulse, right. Neurologic:  Normal speech and language. No gross focal neurologic deficits are appreciated.  Skin:  Skin is warm, dry and intact. No rash noted. Psychiatric: Mood and affect are normal. Speech and behavior are normal. Patient exhibits appropriate insight and judgement.   ____________________________________________   LABS (all labs ordered are listed, but only abnormal results are displayed)  Labs Reviewed - No data to display ____________________________________________  EKG   ____________________________________________  RADIOLOGY I personally viewed and evaluated these images as part of my medical decision making, as well as reviewing the written report by the radiologist.  Dg Hand Complete Right  Result Date: 05/19/2018 CLINICAL DATA:  Impaled knitting needle in thumb. EXAM: RIGHT HAND - COMPLETE 3+ VIEW COMPARISON:  None. FINDINGS: A metallic hooked crochet needle projects over the mid diaphysis of the thumb metacarpal. Without true orthogonal views, it is difficult to exclude osseous involvement by the metallic needle. 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 IMPRESSION: Metallic crochet like foreign body projects over and possibly through the mid-diaphysis of the thumb metacarpal given lack of true orthogonal views of the thumb. The tip of the foreign body is slightly hooked in appearance Electronically Signed   By: Tollie Ethavid  Kwon M.D.   On: 05/19/2018 18:08    ____________________________________________    PROCEDURES  Procedure(s)  performed:    Procedures  Patient's right thumb was anesthetized using 1% lidocaine and a digital block.  Foreign body was removed using traction.  Medications  lidocaine (PF) (XYLOCAINE) 1 % injection (has no administration in time range)  lidocaine (XYLOCAINE) 1 % (with pres) injection 5 mL (5 mLs Infiltration Given 05/19/18 1801)  Tdap (BOOSTRIX) injection 0.5 mL (0.5 mLs Intramuscular Given 05/19/18 1833)     ____________________________________________   INITIAL IMPRESSION / ASSESSMENT AND PLAN / ED COURSE  Pertinent labs & imaging results that were available during my care of the patient were reviewed by me and considered in my medical decision making (see chart for details).  Review of the [place] CSRS was performed in accordance of the [facility] prior to dispensing any controlled drugs.      Assessment and plan Right hand foreign body Patient presents to the emergency department with a right hand foreign body.  Patient's right thumb was anesthetized using lidocaine and foreign body was removed without complication.  Patient's right thumb  was splinted and patient was advised to follow-up with Dr. Stephenie Acres, hand surgeon.  She was discharged with Augmentin and her tetanus status was updated in the emergency department.  All patient questions were answered prior to discharge.    ____________________________________________  FINAL CLINICAL IMPRESSION(S) / ED DIAGNOSES  Final diagnoses:  Foreign body (FB) in soft tissue      NEW MEDICATIONS STARTED DURING THIS VISIT:  ED Discharge Orders         Ordered    amoxicillin-clavulanate (AUGMENTIN) 875-125 MG tablet  2 times daily     05/19/18 1813              This chart was dictated using voice recognition software/Dragon. Despite best efforts to proofread, errors can occur which can change the meaning. Any change was purely unintentional.    Orvil Feil, PA-C 05/19/18 1912    Minna Antis, MD 05/19/18 [phone]

## 2018-05-19 NOTE — ED Triage Notes (Signed)
Got knitting hook stuck in R hand base of thumb.

## 2018-11-15 ENCOUNTER — Encounter: Payer: Self-pay | Admitting: *Deleted

## 2018-11-20 ENCOUNTER — Encounter: Payer: Self-pay | Admitting: *Deleted

## 2019-01-28 ENCOUNTER — Telehealth: Payer: Self-pay | Admitting: *Deleted

## 2019-01-28 DIAGNOSIS — Z122 Encounter for screening for malignant neoplasm of respiratory organs: Secondary | ICD-10-CM

## 2019-01-28 DIAGNOSIS — Z87891 Personal history of nicotine dependence: Secondary | ICD-10-CM

## 2019-01-28 NOTE — Telephone Encounter (Signed)
Patient has been notified that annual lung cancer screening low dose CT scan is due currently or will be in near future. Confirmed that patient is within the age range of 55-77, and asymptomatic, (no signs or symptoms of lung cancer). Patient denies illness that would prevent curative treatment for lung cancer if found. Verified smoking history, (current, 75 pack year). The shared decision making visit was done 03/02/16. Patient is agreeable for CT scan being scheduled.

## 2019-02-05 ENCOUNTER — Other Ambulatory Visit: Payer: Self-pay

## 2019-02-05 ENCOUNTER — Ambulatory Visit
Admission: RE | Admit: 2019-02-05 | Discharge: 2019-02-05 | Disposition: A | Discharge status: Home / Self Care | Payer: Medicare Other | Source: Ambulatory Visit | Attending: Nurse Practitioner | Admitting: Nurse Practitioner

## 2019-02-05 DIAGNOSIS — Z87891 Personal history of nicotine dependence: Secondary | ICD-10-CM

## 2019-02-05 DIAGNOSIS — Z122 Encounter for screening for malignant neoplasm of respiratory organs: Secondary | ICD-10-CM | POA: Diagnosis not present

## 2019-02-06 ENCOUNTER — Encounter: Payer: Self-pay | Admitting: *Deleted

## 2019-03-16 ENCOUNTER — Other Ambulatory Visit: Payer: Self-pay

## 2019-03-16 ENCOUNTER — Encounter: Payer: Self-pay | Admitting: Family Medicine

## 2019-03-16 ENCOUNTER — Telehealth: Payer: Self-pay | Admitting: Family Medicine

## 2019-03-16 ENCOUNTER — Ambulatory Visit (INDEPENDENT_AMBULATORY_CARE_PROVIDER_SITE_OTHER): Payer: Medicare Other | Admitting: Family Medicine

## 2019-03-16 DIAGNOSIS — E785 Hyperlipidemia, unspecified: Secondary | ICD-10-CM

## 2019-03-16 DIAGNOSIS — J432 Centrilobular emphysema: Secondary | ICD-10-CM | POA: Diagnosis not present

## 2019-03-16 DIAGNOSIS — F17200 Nicotine dependence, unspecified, uncomplicated: Secondary | ICD-10-CM | POA: Diagnosis not present

## 2019-03-16 DIAGNOSIS — F17210 Nicotine dependence, cigarettes, uncomplicated: Secondary | ICD-10-CM | POA: Diagnosis not present

## 2019-03-16 DIAGNOSIS — Z716 Tobacco abuse counseling: Secondary | ICD-10-CM

## 2019-03-16 MED ORDER — BREO ELLIPTA 100-25 MCG/INH IN AEPB: 1.0000 | INHALATION_SPRAY | Freq: Every day | RESPIRATORY_TRACT | 6 refills | Status: DC

## 2019-03-16 MED ORDER — ALBUTEROL SULFATE HFA 108 (90 BASE) MCG/ACT IN AERS: 2.0000 | INHALATION_SPRAY | Freq: Four times a day (QID) | RESPIRATORY_TRACT | 2 refills | Status: DC | PRN

## 2019-03-16 NOTE — Telephone Encounter (Signed)
Called Pt and scheduled her lab appt and PE appt

## 2019-03-16 NOTE — Telephone Encounter (Signed)
Please set up fasting lab appt and also CPE appt for patient -- CPE can be in about 4 weeks so we can also follow up on breathing after re-starting inhalers  Thanks  LG

## 2019-03-16 NOTE — Progress Notes (Signed)
Patient ID: Alejandra Ortiz, female   DOB: 08/11/1951, 68 y.o.   MRN: [id number]    Virtual Visit via video Note  This visit type was conducted due to national recommendations for restrictions regarding the COVID-19 pandemic (e.g. social distancing).  This format is felt to be most appropriate for this patient at this time.  All issues noted in this document were discussed and addressed.  No physical exam was performed (except for noted visual exam findings with Video Visits).   I connected with Alejandra Ortiz today at 11:20 AM EDT by a video enabled telemedicine application and verified that I am speaking with the correct person using two identifiers. Location patient: home Location provider: work or home office Persons participating in the virtual visit: patient, provider  I discussed the limitations, risks, security and privacy concerns of performing an evaluation and management service by video and the availability of in person appointments. I also discussed with the patient that there may be a patient responsible charge related to this service. The patient expressed understanding and agreed to proceed.  HPI:  Patient and I connected via video for her to establish with PCP, she formerly saw Dr. Lacinda Axon but he has since left the practice.  Last seen by Dr. Lacinda Axon in 2018.  Patient has a history of COPD, cigarette smoking x50 years, hyperlipidemia.  Patient has not been on any sort of inhaler in approximately a year and a half.  Patient states when she was taking the daily Breo and using albuterol as needed, felt well.  Notices her breathing gets worse especially during the very hot summer months because the air feels very heavy.  Patient has also tried multiple strategies to help quit smoking but has been unsuccessful.  States Wellbutrin and Chantix made her feel funny and she can no longer chew nicotine gum because of her false teeth.  Has also tried nicotine lozenges but did not like the taste  and states the nicotine patches broke her out.  Did take atorvastatin for cholesterol in the past, currently does not take any statin or cholesterol reducing medications.  Denies fever or chills.  Does have a slight cough, but this is chronic and has been present for years, denies any wheezing.  Denies chest pain.  Denies body aches.  Denies GI or GU complaints.   ROS: See pertinent positives and negatives per HPI.  Past Medical History:  Diagnosis Date  . Chicken pox   . COPD (chronic obstructive pulmonary disease) ([place])   . Hyperlipidemia     Past Surgical History:  Procedure Laterality Date  . TUBAL LIGATION  1978    Family History  Family history unknown: Yes    Current Outpatient Medications:  .  albuterol (PROAIR HFA) 108 (90 Base) MCG/ACT inhaler, Inhale 2 puffs into the lungs every 6 (six) hours as needed for wheezing or shortness of breath., Disp: 18 g, Rfl: 2 .  fluticasone furoate-vilanterol (BREO ELLIPTA) 100-25 MCG/INH AEPB, Inhale 1 puff into the lungs daily., Disp: 60 each, Rfl: 6  EXAM:  GENERAL: alert, oriented, appears well and in no acute distress  HEENT: atraumatic, conjunttiva clear, no obvious abnormalities on inspection of external nose and ears  NECK: normal movements of the head and neck  LUNGS: on inspection no signs of respiratory distress, breathing rate appears normal, no obvious gross SOB, gasping or wheezing  CV: no obvious cyanosis  MS: moves all visible extremities without noticeable abnormality  PSYCH/NEURO: pleasant and cooperative, no obvious depression  or anxiety, speech and thought processing grossly intact  ASSESSMENT AND PLAN:  Discussed the following assessment and plan:  COPD -we will restart patient on inhalers to get COPD under better control.  She will resume Breo daily and use albuterol as needed.  Cigarette smoker/smoking cessation counseling-greater than 5 minutes spent discussing smoking cessation with patient.  She  has tried multiple smoking cessation strategies and medications without success.  She is interested in possibly the nicotine inhaler.  Patient made aware I will have to do more research on this as I have never prescribed it and we will also to see if it is covered by her insurance.  Encourage patient to try and cut back slowly on cigarettes by smoking 1 or 2 less per day every week and wean self down.  Hyperlipidemia-patient has history of elevated cholesterol and was on cholesterol medication.  CT chest she had also for COPD/lung cancer screenings did show atherosclerosis.  We will recheck patient's lab work and will plan resume her cholesterol medication.    I discussed the assessment and treatment plan with the patient. The patient was provided an opportunity to ask questions and all were answered. The patient agreed with the plan and demonstrated an understanding of the instructions.   The patient was advised to call back or seek an in-person evaluation if the symptoms worsen or if the condition fails to improve as anticipated.  I provided 25 minutes of video-face-to-face time during this encounter.  We will plan to have patient come in for fasting blood work and we will also set of complete physical exam appointment so we can get screening recommendations for her age up-to-date.  Tracey HarriesLauren M Rosaline Ezekiel, FNP

## 2019-03-17 ENCOUNTER — Other Ambulatory Visit: Payer: Self-pay

## 2019-03-17 ENCOUNTER — Other Ambulatory Visit (INDEPENDENT_AMBULATORY_CARE_PROVIDER_SITE_OTHER): Payer: Medicare Other

## 2019-03-17 DIAGNOSIS — J432 Centrilobular emphysema: Secondary | ICD-10-CM

## 2019-03-17 DIAGNOSIS — E785 Hyperlipidemia, unspecified: Secondary | ICD-10-CM | POA: Diagnosis not present

## 2019-03-17 LAB — COMPREHENSIVE METABOLIC PANEL
ALT: 10 U/L (ref 0–35)
AST: 13 U/L (ref 0–37)
Albumin: 4 g/dL (ref 3.5–5.2)
Alkaline Phosphatase: 57 U/L (ref 39–117)
BUN: 13 mg/dL (ref 6–23)
CO2: 27 mEq/L (ref 19–32)
Calcium: 9 mg/dL (ref 8.4–10.5)
Chloride: 102 mEq/L (ref 96–112)
Creatinine, Ser: 1.01 mg/dL (ref 0.40–1.20)
GFR: 54.25 mL/min — ABNORMAL LOW (ref >60.00)
Glucose, Bld: 83 mg/dL (ref 70–99)
Potassium: 3.5 mEq/L (ref 3.5–5.1)
Sodium: 136 mEq/L (ref 135–145)
Total Bilirubin: 0.5 mg/dL (ref 0.2–1.2)
Total Protein: 6.7 g/dL (ref 6.0–8.3)

## 2019-03-17 LAB — CBC
HCT: 39.7 % (ref 36.0–46.0)
Hemoglobin: 13.2 g/dL (ref 12.0–15.0)
MCHC: 33.3 g/dL (ref 30.0–36.0)
MCV: 99.7 fl (ref 78.0–100.0)
Platelets: 232 10*3/uL (ref 150.0–400.0)
RBC: 3.98 Mil/uL (ref 3.87–5.11)
RDW: 12.8 % (ref 11.5–15.5)
WBC: 7.4 10*3/uL (ref 4.0–10.5)

## 2019-03-17 LAB — TSH: TSH: 1.81 u[IU]/mL (ref 0.35–4.50)

## 2019-03-17 LAB — LIPID PANEL
Cholesterol: 187 mg/dL (ref 0–200)
HDL: 60.4 mg/dL (ref >39.00)
LDL Cholesterol: 115 mg/dL — ABNORMAL HIGH (ref 0–99)
NonHDL: 126.83
Total CHOL/HDL Ratio: 3
Triglycerides: 60 mg/dL (ref 0.0–149.0)
VLDL: 12 mg/dL (ref 0.0–40.0)

## 2019-03-18 ENCOUNTER — Telehealth: Payer: Self-pay | Admitting: Lab

## 2019-03-18 DIAGNOSIS — J432 Centrilobular emphysema: Secondary | ICD-10-CM

## 2019-03-18 MED ORDER — FLUTICASONE-SALMETEROL 250-50 MCG/DOSE IN AEPB: 1.0000 | INHALATION_SPRAY | Freq: Two times a day (BID) | RESPIRATORY_TRACT | 3 refills | Status: DC

## 2019-03-18 NOTE — Telephone Encounter (Signed)
[facility] sent over for patient

## 2019-03-18 NOTE — Addendum Note (Signed)
Addended by: Philis Nettle on: 03/18/2019 11:32 AM   Modules accepted: Orders

## 2019-03-18 NOTE — Telephone Encounter (Signed)
[facility] a Drug [facility] request [facility] stated Drug is not [facility] by Pt plan.  BREO ELLIPTA 100-25MCG ORAL INH(30)  The prefered alternative is  ADVAIRDISKUS

## 2019-04-09 NOTE — Telephone Encounter (Signed)
Thanks So Much.

## 2019-04-09 NOTE — Telephone Encounter (Signed)
This pt was placed on the lab schedule for Tues 8/18 for a Physical. I have moved her to [facility] schedule for the same day & time.

## 2019-04-14 ENCOUNTER — Encounter: Payer: Self-pay | Admitting: Family Medicine

## 2019-04-14 ENCOUNTER — Other Ambulatory Visit: Payer: Self-pay

## 2019-04-14 ENCOUNTER — Ambulatory Visit (INDEPENDENT_AMBULATORY_CARE_PROVIDER_SITE_OTHER): Payer: Medicare Other | Admitting: Family Medicine

## 2019-04-14 VITALS — BP 118/68 | HR 77 | Temp 97.6°F | Resp 16 | Ht 61.5 in | Wt 105.8 lb

## 2019-04-14 DIAGNOSIS — Z1211 Encounter for screening for malignant neoplasm of colon: Secondary | ICD-10-CM

## 2019-04-14 DIAGNOSIS — Z0000 Encounter for general adult medical examination without abnormal findings: Secondary | ICD-10-CM

## 2019-04-14 DIAGNOSIS — F17210 Nicotine dependence, cigarettes, uncomplicated: Secondary | ICD-10-CM

## 2019-04-14 DIAGNOSIS — Z1231 Encounter for screening mammogram for malignant neoplasm of breast: Secondary | ICD-10-CM

## 2019-04-14 DIAGNOSIS — Z780 Asymptomatic menopausal state: Secondary | ICD-10-CM

## 2019-04-14 DIAGNOSIS — M25512 Pain in left shoulder: Secondary | ICD-10-CM | POA: Diagnosis not present

## 2019-04-14 DIAGNOSIS — Z0001 Encounter for general adult medical examination with abnormal findings: Secondary | ICD-10-CM | POA: Diagnosis not present

## 2019-04-14 DIAGNOSIS — G8929 Other chronic pain: Secondary | ICD-10-CM

## 2019-04-14 NOTE — Progress Notes (Addendum)
Subjective:    Patient ID: Alejandra Ortiz, female    DOB: 30-Oct-1950, 68 y.o.   MRN: [id number]  HPI  Patient presents to clinic for complete physical exam.  Overall she is feeling well.  Only complaint is some left shoulder pain that she has noted off and on for many weeks and seems worse over the past month or so.  Left shoulder will feel very stiff especially when having to reach arm up above head to comb hair and put her back in a ponytail.  Has not had a mammogram or bone density study recently, we will get this ordered for her.  Pap smear no longer required for her age.  She is postmenopausal.  No issues with abnormal vaginal discharge, vaginal pain or vaginal bleeding.  She does not want to do colonoscopy, but is agreeable to Cologuard.  She does get annual CT chest lung cancer screenings performed due to long smoking history.   Will get flu vaccine through her employer -- gets every October   She sees eye doctor annually and dentist every 6-12 months.   Patient Active Problem List   Diagnosis Date Noted  . Annual physical exam 11/19/2016  . Personal history of tobacco use, presenting hazards to health 03/01/2016  . Hyperlipidemia 02/09/2016  . COPD (chronic obstructive pulmonary disease) with emphysema ([place]) 02/07/2016  . Pulmonary scarring 02/07/2016  . Tobacco use disorder 11/24/2014   Social History   Tobacco Use  . Smoking status: Current Every Day Smoker    Packs/day: 1.50    Years: 48.00    Pack years: 72.00    Types: Cigarettes  . Smokeless tobacco: Never Used  Substance Use Topics  . Alcohol use: No    Alcohol/week: 0.0 standard drinks   Past Surgical History:  Procedure Laterality Date  . TUBAL LIGATION  1978   Family History  Family history unknown: Yes    Review of Systems   Constitutional: Negative for chills, fatigue and fever.  HENT: Negative for congestion, ear pain, sinus pain and sore throat.   Eyes: Negative.   Respiratory: Negative  for cough, shortness of breath and wheezing.   Cardiovascular: Negative for chest pain, palpitations and leg swelling.  Gastrointestinal: Negative for abdominal pain, diarrhea, nausea and vomiting.  Genitourinary: Negative for dysuria, frequency and urgency.  Musculoskeletal: +left shoulder pain Skin: Negative for color change, pallor and rash.  Neurological: Negative for syncope, light-headedness and headaches.  Psychiatric/Behavioral: The patient is not nervous/anxious.       Objective:   Physical Exam Vitals signs and nursing note reviewed.  Constitutional:      General: She is not in acute distress.    Appearance: She is not ill-appearing, toxic-appearing or diaphoretic.  HENT:     Head: Normocephalic and atraumatic.     Right Ear: Tympanic membrane, ear canal and external ear normal.     Left Ear: Tympanic membrane, ear canal and external ear normal.  Eyes:     General: No scleral icterus.    Extraocular Movements: Extraocular movements intact.     Pupils: Pupils are equal, round, and reactive to light.  Neck:     Musculoskeletal: Normal range of motion and neck supple. No neck rigidity.     Vascular: No carotid bruit.  Cardiovascular:     Rate and Rhythm: Normal rate and regular rhythm.     Heart sounds: Normal heart sounds.  Pulmonary:     Effort: Pulmonary effort is normal.  Breath sounds: Normal breath sounds. No wheezing, rhonchi or rales.  Chest:     Breasts:        Right: No swelling, bleeding, inverted nipple, mass, nipple discharge, skin change or tenderness.        Left: No swelling, bleeding, inverted nipple, mass, nipple discharge, skin change or tenderness.  Abdominal:     General: Bowel sounds are normal. There is no distension.     Palpations: Abdomen is soft. There is no mass.     Tenderness: There is no abdominal tenderness. There is no right CVA tenderness, left CVA tenderness, guarding or rebound.     Hernia: No hernia is present.  Musculoskeletal:      Left shoulder: She exhibits decreased range of motion and bony tenderness (AC joint tendernes. ).     Right lower leg: No edema.     Left lower leg: No edema.  Lymphadenopathy:     Cervical: No cervical adenopathy.     Upper Body:     Right upper body: No supraclavicular, axillary or pectoral adenopathy.     Left upper body: No supraclavicular, axillary or pectoral adenopathy.  Skin:    General: Skin is warm and dry.     Capillary Refill: Capillary refill takes less than 2 seconds.     Coloration: Skin is not jaundiced or pale.  Neurological:     General: No focal deficit present.     Mental Status: She is alert and oriented to person, place, and time.     Gait: Gait normal.  Psychiatric:        Mood and Affect: Mood normal.        Behavior: Behavior normal.        Thought Content: Thought content normal.        Judgment: Judgment normal.    Vitals:   04/14/19 1037  BP: 118/68  Pulse: 77  Resp: 16  Temp: 97.6 F (36.4 C)  SpO2: 94%   Body mass index is 19.67 kg/m.   Wt Readings from Last 3 Encounters:  04/14/19 105 lb 12.8 oz (48 kg)  02/05/19 104 lb (47.2 kg)  05/19/18 104 lb (47.2 kg)      Assessment & Plan:    Well adult exam - overall patient appears to be doing well.  Recent lab work from July 2020 reviewed and is acceptable.  Blood pressure is normal.  BMI is stable.  She gets annual CT of chest for lung cancer screening.  We will order Cologuard for colon cancer screening.  We will set up mammogram and bone density study.  Discussed healthy diet and regular physical activity including walking and lifting small weights, recommended diet full of various fruits, vegetables, whole grains, good protein.  Discussed good water intake.  Also recommended safe sun practices including wearing SPF of minimum 30 when outdoors, wearing a widebrimmed hat and even long sleeves of going to be in direct sunlight for many hours.  She always wears seatbelt when in car.  She does  see eye doctor and dentist regularly.  Nicotine Dependence -- Discussed smoking cessation.  She has no plans to quit at this time.  Has tried multiple avenues to try and quit smoking previously including Wellbutrin, Chantix, gum and patches and none were successful, declines retrying any of these things.  Greater than 3 minutes spent discussing smoking cessation and risks of continued smoking.  Left shoulder pain - we will further work this up by getting x-ray.  Advised  she can use topical rub like BenGay or Biofreeze to help reduce pain and Tylenol as needed.  Once we have x-ray results we will better be able to determine next step in plan of care including possible referral to sports medicine and orthopedic for further evaluation and management.  Patient will follow-up here in office in approximately 6 months for recheck on chronic conditions.  She is aware she can return to clinic sooner if any issues arise.

## 2019-04-27 ENCOUNTER — Ambulatory Visit (INDEPENDENT_AMBULATORY_CARE_PROVIDER_SITE_OTHER)
Admission: RE | Admit: 2019-04-27 | Discharge: 2019-04-27 | Disposition: A | Discharge status: Home / Self Care | Payer: Medicare Other | Source: Ambulatory Visit | Attending: Family Medicine | Admitting: Family Medicine

## 2019-04-27 ENCOUNTER — Other Ambulatory Visit: Payer: Self-pay | Admitting: Family Medicine

## 2019-04-27 DIAGNOSIS — M25512 Pain in left shoulder: Secondary | ICD-10-CM | POA: Diagnosis not present

## 2019-04-27 DIAGNOSIS — G8929 Other chronic pain: Secondary | ICD-10-CM | POA: Diagnosis not present

## 2019-05-08 ENCOUNTER — Encounter: Payer: Self-pay | Admitting: Family Medicine

## 2019-07-31 ENCOUNTER — Other Ambulatory Visit: Payer: Self-pay | Admitting: Internal Medicine

## 2019-07-31 DIAGNOSIS — J432 Centrilobular emphysema: Secondary | ICD-10-CM

## 2019-07-31 NOTE — Telephone Encounter (Signed)
Medication Refill - Medication: albuterol (PROAIR HFA) 108 (90 Base) MCG/ACT inhaler    Has the patient contacted their pharmacy? Yes.   (Agent: If no, request that the patient contact the pharmacy for the refill.) (Agent: If yes, when and what did the pharmacy advise?)  Preferred Pharmacy (with phone number or street name) [facility] [place], [place] [phone] (Phone) [phone] (Fax)     Agent: Please be advised that RX refills may take up to 3 business days. We ask that you follow-up with your pharmacy.

## 2019-07-31 NOTE — Telephone Encounter (Signed)
Requested medication (s) are due for refill today: yes  Requested medication (s) are on the active medication list: yes  Last refill:  03/16/2019  Future visit scheduled: no  Notes to clinic:  Review for refill One inhaler should last one month   Requested Prescriptions  Pending Prescriptions Disp Refills   albuterol (PROAIR HFA) 108 (90 Base) MCG/ACT inhaler 18 g 2    Sig: Inhale 2 puffs into the lungs every 6 (six) hours as needed for wheezing or shortness of breath.     Pulmonology:  Beta Agonists Failed - 07/31/2019 10:53 AM      Failed - One inhaler should last at least one month. If the patient is requesting refills earlier, contact the patient to check for uncontrolled symptoms.      Passed - Valid encounter within last 12 months    Recent Outpatient Visits          3 months ago Well adult exam   [place] [facility], Jacquelynn Cree, FNP   4 months ago Centrilobular emphysema [facility])   [place] [facility], Jacquelynn Cree, FNP   2 years ago Annual physical exam   [facility], [place], DO   3 years ago Centrilobular emphysema [facility])   [place], [place], [place]   3 years ago Routine general medical examination at a health care facility   [facility], Velora Heckler, RN

## 2019-08-03 MED ORDER — ALBUTEROL SULFATE HFA 108 (90 BASE) MCG/ACT IN AERS: 2.0000 | INHALATION_SPRAY | Freq: Four times a day (QID) | RESPIRATORY_TRACT | 0 refills | Status: DC | PRN

## 2019-08-25 ENCOUNTER — Other Ambulatory Visit: Payer: Self-pay | Admitting: Family Medicine

## 2019-08-25 DIAGNOSIS — J432 Centrilobular emphysema: Secondary | ICD-10-CM

## 2019-09-29 ENCOUNTER — Telehealth: Payer: Self-pay | Admitting: Family Medicine

## 2019-09-29 DIAGNOSIS — J432 Centrilobular emphysema: Secondary | ICD-10-CM

## 2019-09-29 MED ORDER — ALBUTEROL SULFATE HFA 108 (90 BASE) MCG/ACT IN AERS: 2.0000 | INHALATION_SPRAY | Freq: Four times a day (QID) | RESPIRATORY_TRACT | 1 refills | Status: DC | PRN

## 2019-09-29 NOTE — Telephone Encounter (Signed)
Pt needs a refill on albuterol (PROAIR HFA) 108 (90 Base) MCG/ACT inhaler sent to [facility]

## 2019-09-29 NOTE — Telephone Encounter (Signed)
-   Albuterol refilled.

## 2019-09-30 ENCOUNTER — Other Ambulatory Visit: Payer: Self-pay

## 2019-09-30 DIAGNOSIS — J432 Centrilobular emphysema: Secondary | ICD-10-CM

## 2019-09-30 MED ORDER — ALBUTEROL SULFATE HFA 108 (90 BASE) MCG/ACT IN AERS: 2.0000 | INHALATION_SPRAY | Freq: Four times a day (QID) | RESPIRATORY_TRACT | 1 refills | Status: DC | PRN

## 2019-10-16 ENCOUNTER — Ambulatory Visit: Payer: TRICARE For Life (TFL) | Admitting: Family Medicine

## 2019-11-10 ENCOUNTER — Other Ambulatory Visit: Payer: Self-pay

## 2019-11-10 ENCOUNTER — Other Ambulatory Visit: Payer: Self-pay | Admitting: Nurse Practitioner

## 2019-11-10 ENCOUNTER — Ambulatory Visit: Payer: Medicare Other | Admitting: Nurse Practitioner

## 2019-11-10 ENCOUNTER — Ambulatory Visit: Payer: Medicare Other

## 2019-11-10 ENCOUNTER — Encounter: Payer: Self-pay | Admitting: Nurse Practitioner

## 2019-11-10 VITALS — BP 102/62 | HR 73 | Temp 97.3°F | Ht 60.0 in | Wt 108.6 lb

## 2019-11-10 DIAGNOSIS — J432 Centrilobular emphysema: Secondary | ICD-10-CM | POA: Diagnosis not present

## 2019-11-10 DIAGNOSIS — M79672 Pain in left foot: Secondary | ICD-10-CM | POA: Diagnosis not present

## 2019-11-10 MED ORDER — ALBUTEROL SULFATE HFA 108 (90 BASE) MCG/ACT IN AERS: 2.0000 | INHALATION_SPRAY | Freq: Four times a day (QID) | RESPIRATORY_TRACT | 1 refills | Status: DC | PRN

## 2019-11-10 NOTE — Patient Instructions (Addendum)
It was nice to meet you today.   Please go to the Xray for your left foot. We will call you with the results once the Radiologist reads it.   Follow up in 4 weeks.   I refilled your albuterol.

## 2019-11-10 NOTE — Progress Notes (Signed)
Established Patient Office Visit  Subjective:  Patient ID: Alejandra Ortiz, female    DOB: Mar 13, 1951  Age: 69 y.o. MRN: [id number]  CC:  Chief Complaint  Patient presents with  . Transitions Of Care   HPI Alejandra Ortiz presents for left foot pain onset 3 weeks ago. She felt  discomfort and felt it "popped"- never looked bad but has hurt ever since. No known  injury. It swells more at night. She takes Motrin for constant pain and wraps it so she can go to work. She is walking on it daily. No falls.No DEXA. High risk for osteoporosis. She has seen Ortho in 2020 for left shoulder capsulitis. No prior ft or ankle problems. No DM.   COPD:  She is a 3 ppd smoker- now down to 1 ppd with nicotine gum. She is at her baseline and reports no DOE, some cough and uses Albuterol daily . She needs a refill today. No recent PFTs. No recent bronchitis . She has not had the Covid vaccine.   Past Medical History:  Diagnosis Date  . Chicken pox   . COPD (chronic obstructive pulmonary disease) ([facility])   . Hyperlipidemia     Past Surgical History:  Procedure Laterality Date  . TUBAL LIGATION  1978    Family History  Family history unknown: Yes    Social History   Socioeconomic History  . Marital status: Married    Spouse name: Not on file  . Number of children: Not on file  . Years of education: Not on file  . Highest education level: Not on file  Occupational History  . Not on file  Tobacco Use  . Smoking status: Current Every Day Smoker    Packs/day: 1.50    Years: 48.00    Pack years: 72.00    Types: Cigarettes  . Smokeless tobacco: Never Used  Substance and Sexual Activity  . Alcohol use: No    Alcohol/week: 0.0 standard drinks  . Drug use: No  . Sexual activity: Never    Birth control/protection: Surgical  Other Topics Concern  . 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 Difficulty of Paying Living Expenses:   Food Insecurity:   . Worried About Programme researcher, broadcasting/film/video in the Last Year:   . Barista in the Last Year:   Transportation Needs:   . Freight forwarder (Medical):   Marland Kitchen Lack of Transportation (Non-Medical):   Physical Activity:   . Days of Exercise per Week:   . Minutes of Exercise per Session:   Stress:   . Feeling of Stress :   Social Connections:   . Frequency of Communication with Friends and Family:   . Frequency of Social Gatherings with Friends and Family:   . Attends Religious Services:   . Active Member of Clubs or Organizations:   . Attends Banker Meetings:   Marland Kitchen Marital Status:   Intimate Partner Violence:   . Fear of Current or Ex-Partner:   . Emotionally Abused:   Marland Kitchen Physically Abused:   . Sexually Abused:     Outpatient Medications Prior to Visit  Medication  Sig Dispense Refill  . fluticasone furoate-vilanterol (BREO ELLIPTA) 100-25 MCG/INH AEPB Inhale 1 puff into the lungs daily. 60 each 6  . Fluticasone-Salmeterol (ADVAIR DISKUS) 250-50 MCG/DOSE AEPB Inhale 1 puff into the lungs 2 (two) times daily. 1 each 3  . albuterol (PROAIR HFA) 108 (90 Base) MCG/ACT inhaler Inhale 2 puffs into the lungs every 6 (six) hours as needed for wheezing or shortness of breath. 18 g 1   No facility-administered medications prior to visit.    No Known Allergies  Review of Systems  Constitutional: Negative.   HENT: Negative.   Respiratory: Positive for cough. Negative for chest tightness, shortness of breath and wheezing.   Cardiovascular: Negative for chest pain, palpitations and leg swelling.  Gastrointestinal: Negative.   Genitourinary: Negative.   Musculoskeletal:       Left ft pain- see HPI  Neurological: Negative.   Hematological:  Negative.   Psychiatric/Behavioral:       Denies any depression/anxiety      Objective:    Physical Exam  Constitutional: She is oriented to person, place, and time. She appears well-developed. She appears distressed.  Eyes: Pupils are equal, round, and reactive to light.  Cardiovascular: Normal rate and regular rhythm.  Pulmonary/Chest: Effort normal and breath sounds normal.  Abdominal: Soft. There is no abdominal tenderness.  Musculoskeletal:     Cervical back: Normal range of motion.     Comments: Left dorsal foot with slight swelling and pink erythema. Full ROM and mild tenderness over dorsum of the foot- non-specific. No ankle swelling, pain. Pulses intact. Sensation intact. Walking without difficulty.   Neurological: She is alert and oriented to person, place, and time.  Skin: Skin is warm and dry. There is erythema.  Psychiatric: She has a normal mood and affect. Her behavior is normal. Judgment and thought content normal.    BP 102/62   Pulse 73   Temp (!) 97.3 F (36.3 C) (Skin)   Ht 5' (1.524 m)   Wt 108 lb 9.6 oz (49.3 kg)   SpO2 93%   BMI 21.21 kg/m  Wt Readings from Last 3 Encounters:  11/10/19 108 lb 9.6 oz (49.3 kg)  04/14/19 105 lb 12.8 oz (48 kg)  02/05/19 104 lb (47.2 kg)    Health Maintenance Due  Topic Date Due  . MAMMOGRAM  Never done  . DEXA SCAN  Never done    There are no preventive care reminders to display for this patient.  Lab Results  Component Value Date   TSH 1.81 03/17/2019   Lab Results  Component Value Date   WBC 7.4 03/17/2019   HGB 13.2 03/17/2019   HCT 39.7 03/17/2019   MCV 99.7 03/17/2019   PLT 232.0 03/17/2019   Lab Results  Component Value Date   NA 136 03/17/2019   K 3.5 03/17/2019   CO2 27 03/17/2019   GLUCOSE 83 03/17/2019   BUN 13 03/17/2019   CREATININE 1.01 03/17/2019   BILITOT 0.5 03/17/2019   ALKPHOS 57 03/17/2019   AST 13 03/17/2019   ALT 10 03/17/2019   PROT 6.7 03/17/2019   ALBUMIN 4.0 03/17/2019    CALCIUM 9.0 03/17/2019   ANIONGAP 4 (L) 09/25/2013   GFR 54.25 (L) 03/17/2019     Assessment & Plan:   Problem List Items Addressed This Visit      Respiratory   COPD (chronic obstructive pulmonary disease) with emphysema ([facility])    Other Visit Diagnoses    Left foot pain    -  Primary   Relevant Orders   DG Foot Complete Left (Completed)   Pain and swelling of toe of left foot          Meds ordered this encounter  Medications  . DISCONTD: albuterol (PROAIR HFA) 108 (90 Base) MCG/ACT inhaler    Sig: Inhale 2 puffs into the lungs every 6 (six) hours as needed for wheezing or shortness of breath.    Dispense:  18 g    Refill:  1    Order Specific Question:   Supervising Provider    Answer:   Einar Pheasant [[id number]]   It was nice to meet you today.   Please go to the Xray for your left foot. We will call you with the results once the Radiologist reads it.   Follow up in 4 weeks.   I refilled your albuterol. She was advised to get the Covid vaccine.   Follow-up: Return in about 4 weeks (around 12/08/2019).   This visit occurred during the SARS-CoV-2 public health emergency.  Safety protocols were in place, including screening questions prior to the visit, additional usage of staff PPE, and extensive cleaning of exam room while observing appropriate contact time as indicated for disinfecting solutions.   Denice Paradise, NP

## 2019-11-11 ENCOUNTER — Telehealth: Payer: Self-pay | Admitting: Nurse Practitioner

## 2019-11-11 DIAGNOSIS — M79672 Pain in left foot: Secondary | ICD-10-CM

## 2019-11-11 NOTE — Telephone Encounter (Signed)
Please call her with the X-ray report that does show No acute findings. Possible posttraumatic deformity of the distal 5th metatarsal. I could not reach her by phone.   Since the top of the foot is swollen and erythematous- I want her to see PODIATRY ASAP. [facility] I placed the referral.

## 2019-11-12 ENCOUNTER — Telehealth: Payer: Self-pay | Admitting: Nurse Practitioner

## 2019-11-12 ENCOUNTER — Encounter: Payer: Self-pay | Admitting: Nurse Practitioner

## 2019-11-12 NOTE — Telephone Encounter (Signed)
Can you check on this please? [name] placed last night.

## 2019-11-12 NOTE — Telephone Encounter (Signed)
I called pt and left a vm on husband vm to call ofc.

## 2019-11-12 NOTE — Telephone Encounter (Signed)
I re ordered the Podiatry consult as stat.

## 2019-11-13 ENCOUNTER — Ambulatory Visit: Payer: Medicare Other | Admitting: Podiatry

## 2019-11-13 NOTE — Telephone Encounter (Signed)
Pt called returning your call.. pt needs to be called before 4pm do to work schedule

## 2019-11-20 NOTE — Telephone Encounter (Signed)
Good afternoon!  Pt was scheduled on 11/13/2019 no show and new appt scheduled on 11/26/2019.

## 2019-11-26 ENCOUNTER — Ambulatory Visit: Payer: TRICARE For Life (TFL) | Admitting: Podiatry

## 2019-12-03 ENCOUNTER — Telehealth: Payer: Self-pay | Admitting: Nurse Practitioner

## 2019-12-03 ENCOUNTER — Ambulatory Visit: Payer: Medicare Other | Admitting: Podiatry

## 2019-12-07 NOTE — Telephone Encounter (Signed)
Spoken to patient. By the time she was able to be seen her foot feels back to normal. She did not go because she is not having anymore px.

## 2019-12-07 NOTE — Telephone Encounter (Signed)
How is her foot doing? Did she get to PODIATRY as arranged 11/12/2019? If not, what can we do to facilitate this?   Remind her: The X-ray report that does show no acute findings. Possible posttraumatic deformity of the distal 5th metatarsal. I could not reach her by phone.  Since the top of the foot is swollen and erythematous- I want her to see PODIATRY ASAP. [facility] I placed the referral.   If we can't reach her by phone, again, please send letter.

## 2020-02-03 ENCOUNTER — Telehealth: Payer: Self-pay

## 2020-02-03 NOTE — Telephone Encounter (Signed)
Unsuccessful attempt at calling patient to notify them that it is time to schedule the low dose lung cancer screening CT scan. 

## 2020-02-12 ENCOUNTER — Telehealth: Payer: Self-pay | Admitting: *Deleted

## 2020-02-12 NOTE — Telephone Encounter (Signed)
(  02/12/20) Could not leave message for pt to notify them that it is time to schedule annual low dose lung cancer screening CT scan. Will call back to verify information prior to the scan being scheduled SRW

## 2020-05-20 ENCOUNTER — Other Ambulatory Visit: Payer: Self-pay | Admitting: Nurse Practitioner

## 2020-05-20 DIAGNOSIS — J432 Centrilobular emphysema: Secondary | ICD-10-CM

## 2020-06-03 ENCOUNTER — Encounter: Payer: Self-pay | Admitting: *Deleted

## 2020-06-03 NOTE — Progress Notes (Signed)
Can you reach out to her for a follow-up OV and she is due for lung CT for screening. I do not see any activity for her in the system recently. Is she still living at home? Thx.

## 2020-06-14 ENCOUNTER — Telehealth (INDEPENDENT_AMBULATORY_CARE_PROVIDER_SITE_OTHER): Payer: Medicare Other | Admitting: Nurse Practitioner

## 2020-06-14 ENCOUNTER — Encounter: Payer: Self-pay | Admitting: Nurse Practitioner

## 2020-06-14 VITALS — Temp 97.3°F | Ht 60.0 in | Wt 100.0 lb

## 2020-06-14 DIAGNOSIS — J019 Acute sinusitis, unspecified: Secondary | ICD-10-CM | POA: Diagnosis not present

## 2020-06-14 DIAGNOSIS — J441 Chronic obstructive pulmonary disease with (acute) exacerbation: Secondary | ICD-10-CM | POA: Diagnosis not present

## 2020-06-14 DIAGNOSIS — J432 Centrilobular emphysema: Secondary | ICD-10-CM | POA: Diagnosis not present

## 2020-06-14 DIAGNOSIS — Z20822 Contact with and (suspected) exposure to covid-19: Secondary | ICD-10-CM | POA: Diagnosis not present

## 2020-06-14 MED ORDER — SACCHAROMYCES BOULARDII 250 MG PO CAPS: 250.0000 mg | ORAL_CAPSULE | Freq: Two times a day (BID) | ORAL | 0 refills | Status: AC

## 2020-06-14 MED ORDER — AMOXICILLIN-POT CLAVULANATE 875-125 MG PO TABS: 1.0000 | ORAL_TABLET | Freq: Two times a day (BID) | ORAL | 0 refills | Status: AC

## 2020-06-14 MED ORDER — PREDNISONE 10 MG PO TABS: ORAL_TABLET | ORAL | 0 refills | Status: DC

## 2020-06-14 MED ORDER — DEXTROMETHORPHAN-GUAIFENESIN 5-100 MG/5ML PO LIQD: 10.0000 mL | Freq: Every evening | ORAL | 0 refills | Status: AC | PRN

## 2020-06-14 MED ORDER — BENZONATATE 200 MG PO CAPS: 200.0000 mg | ORAL_CAPSULE | Freq: Three times a day (TID) | ORAL | 0 refills | Status: AC | PRN

## 2020-06-14 NOTE — Progress Notes (Signed)
Virtual Visit via Video Note  This visit type was conducted due to national recommendations for restrictions regarding the COVID-19 pandemic (e.g. social distancing).  This format is felt to be most appropriate for this patient at this time.  All issues noted in this document were discussed and addressed.  No physical exam was performed (except for noted visual exam findings with Video Visits).   I connected with@ on 06/16/20 at  4:00 PM EDT by a video enabled telemedicine application or telephone and verified that I am speaking with the correct person using two identifiers. 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 I also discussed with the patient that there may be a patient responsible charge related to this service. The patient expressed understanding and agreed to proceed.  Reason for visit: Patient with cough and sneezing with pain behind left eye for couple weeks, coughing up light green mucus.  HPI: This 69 year old patient with history of COPD, tobacco use disorder reports onset of a bad cough for at least 2 weeks now with a purulent green mucus.  She has had nasal congestion, drainage, and is afraid this is going into pneumonia like she had in the past.  Patient is using her albuterol and Breo inhaler as directed.  She is also using a Pulmicort nebulizer a few times from another family member.  She has had temps of 97- 98.2 T-max.  She has more short of breath than usual over the last week associated with wheezing cough. No chest pain, pressure or heaviness, but there is little tightness and wheezing.  She has not taken any cough syrup, and is sleeping fair.  She is working every day.  Her smoking is down to 8 cigarettes  a day which is wonderful.  Patient lives with her husband, 2 daughters, son-in-law, 4 grandkids.  No one in the family has been ill.  No contacts with Covid infection that she is aware.  She had 3 Pfizer vaccines including the booster recently on 05/26/2020.  She has not had the flu vaccine yet this year.  ROS: See pertinent positives and negatives per HPI.  Past Medical History:  Diagnosis Date  . Chicken pox   . COPD (chronic obstructive pulmonary disease) (HCC)   . Hyperlipidemia     Past Surgical History:  Procedure Laterality Date  . TUBAL LIGATION  1978    Family History  Family history unknown: Yes    SOCIAL HX: He was a 3 pack a day smoker now down to 8 cigarettes a day.   Current Outpatient Medications:  .  albuterol (VENTOLIN HFA) 108 (90 Base) MCG/ACT inhaler, INHALE 2 PUFFS BY MOUTH INTO THE LUNGS EVERY 6 HOURS AS NEEDED FOR WHEEZING OR SHORTNESS OF BREATH, Disp: 18 g, Rfl: 6 .  fluticasone furoate-vilanterol (BREO ELLIPTA) 100-25 MCG/INH AEPB, Inhale 1 puff into the lungs daily., Disp: 60 each, Rfl: 6 .  Fluticasone-Salmeterol (ADVAIR DISKUS) 250-50 MCG/DOSE AEPB, Inhale 1 puff into the lungs 2 (two) times daily., Disp: 1 each, Rfl: 3 .  amoxicillin-clavulanate (AUGMENTIN) 875-125 MG tablet, Take 1 tablet by mouth 2 (two) times daily for 7 days., Disp: 14 tablet, Rfl: 0 .  benzonatate (TESSALON) 200 MG capsule, Take 1 capsule (200 mg total) by mouth 3 (three) times daily as  needed for up to 7 days for cough., Disp: 21 capsule, Rfl: 0 .  Dextromethorphan-guaiFENesin 5-100 MG/5ML LIQD, Take 10 mLs by mouth at bedtime as needed for up to 7 days (for cough)., Disp: 70 mL, Rfl: 0 .  predniSONE (DELTASONE) 10 MG tablet, Take 6 tablets ( total 60 mg) by mouth for 1day; take 5 tablets ( total 50 mg) by mouth the next day; take 4 tablets  (total 40 mg) by mouth the next day and continue to decrease by 1 tablet (10 mg) by mouth each day until off. This is a 6 day taper., Disp: 21  tablet, Rfl: 0 .  saccharomyces boulardii (FLORASTOR) 250 MG capsule, Take 1 capsule (250 mg total) by mouth 2 (two) times daily for 14 days., Disp: 28 capsule, Rfl: 0  EXAM:  VITALS per patient if applicable: Temp 97.3  GENERAL: alert, oriented, appears fatigued, in no acute distress  HEENT: atraumatic, conjunctiva clear, no obvious abnormalities on inspection of external nose and ears  NECK: normal movements of the head and neck  LUNGS: On inspection no signs of respiratory distress, breathing rate appears normal, no obvious gross SOB, gasping or wheezing.  With the cough, she has wheezing and congestion.  When she coughs, she has repeated coughing.  CV: no obvious cyanosis  MS: moves all visible extremities without noticeable abnormality  PSYCH/NEURO: pleasant and cooperative, no obvious depression or anxiety, speech and thought processing grossly intact  ASSESSMENT AND PLAN:  Discussed the following assessment and plan:  Centrilobular emphysema (HCC)  COPD with acute exacerbation (HCC) - Plan: DG Chest 2 View, POCT Influenza A/B  Acute non-recurrent sinusitis, unspecified location - Plan: DG Chest 2 View, POCT Influenza A/B  Suspected COVID-19 virus infection - Plan: Novel Coronavirus, NAA ([facility])  No problem-specific Assessment & Plan notes found for this encounter.  Please obtain Covid test and influenza test through the drive-through tomorrow after 2 PM.  She was advised she would need to quarantine until these tests are back. She will pick up a hard copy of her work note tomorrow when she gets her swab.    You have a 2-week history of cough with green sputum in addition to chronic emphysema.  You are concerned about this turning into pneumonia. This is likely a bacterial infection and recommend treatment with Augmentin 875 mg twice daily for 7 days.  Please take a probiotic with this to help avoid diarrhea.  Please eat yogurt such as Activa.   For the tightness and  wheezing, recommend a prednisone taper.  Continue with your albuterol and Breo inhaler as you are doing.  Tessalon Perles 200 mg 1 pill 3 times daily as needed for cough during daytime  Mucinex DM for cough at night.    Please give [place] a telephone update in 48 hours for symptom update. I discussed the assessment and treatment plan with the patient. The patient was provided an opportunity to ask questions and all were answered. The patient agreed with the plan and demonstrated an understanding of the instructions.   The patient was advised to call back or seek an in-person evaluation if the symptoms worsen or if the condition fails to improve as anticipated.  Amedeo Kinsman, NP Adult Nurse Practitioner [facility] [employer] [phone]

## 2020-06-15 ENCOUNTER — Ambulatory Visit
Admission: RE | Admit: 2020-06-15 | Discharge: 2020-06-15 | Disposition: A | Discharge status: Home / Self Care | Payer: Medicare Other | Source: Ambulatory Visit | Attending: Nurse Practitioner | Admitting: Nurse Practitioner

## 2020-06-15 ENCOUNTER — Other Ambulatory Visit: Payer: TRICARE For Life (TFL)

## 2020-06-15 DIAGNOSIS — J019 Acute sinusitis, unspecified: Secondary | ICD-10-CM

## 2020-06-15 DIAGNOSIS — J441 Chronic obstructive pulmonary disease with (acute) exacerbation: Secondary | ICD-10-CM | POA: Insufficient documentation

## 2020-06-15 NOTE — Patient Instructions (Addendum)
Please obtain Covid test and influenza test through the drive-through tomorrow after 2 PM.  She was advised she would need to quarantine until these tests are back. She will pick up a hard copy of her work note tomorrow when she gets her swab.    You have a 2-week history of cough with green sputum in addition to chronic emphysema.  You are concerned about this turning into pneumonia. This is likely a bacterial infection and recommend treatment with Augmentin 875 mg twice daily for 7 days.  Please take a probiotic with this to help avoid diarrhea.  Please eat yogurt such as Activa.   For the tightness and wheezing, recommend a prednisone taper.  Continue with your albuterol and Breo inhaler as you are doing.  Tessalon Perles 200 mg 1 pill 3 times daily as needed for cough during daytime  Mucinex DM for cough at night.    Please give [place] a telephone update in 48 hours for symptom update.

## 2020-06-16 ENCOUNTER — Other Ambulatory Visit: Payer: TRICARE For Life (TFL)

## 2020-06-16 ENCOUNTER — Encounter: Payer: Self-pay | Admitting: Nurse Practitioner

## 2020-06-16 ENCOUNTER — Other Ambulatory Visit (INDEPENDENT_AMBULATORY_CARE_PROVIDER_SITE_OTHER): Payer: Medicare Other

## 2020-06-16 DIAGNOSIS — J019 Acute sinusitis, unspecified: Secondary | ICD-10-CM

## 2020-06-16 DIAGNOSIS — J441 Chronic obstructive pulmonary disease with (acute) exacerbation: Secondary | ICD-10-CM | POA: Diagnosis not present

## 2020-06-16 DIAGNOSIS — Z20822 Contact with and (suspected) exposure to covid-19: Secondary | ICD-10-CM

## 2020-06-16 LAB — POCT INFLUENZA A/B
Influenza A, POC: NEGATIVE
Influenza B, POC: NEGATIVE

## 2020-06-17 LAB — SARS-COV-2, NAA 2 DAY TAT

## 2020-06-17 LAB — NOVEL CORONAVIRUS, NAA: SARS-CoV-2, NAA: NOT DETECTED

## 2020-06-18 ENCOUNTER — Telehealth: Payer: Self-pay | Admitting: Internal Medicine

## 2020-06-18 NOTE — Telephone Encounter (Signed)
FYI. Received notice that pt is overdue mammogram.

## 2020-06-20 NOTE — Telephone Encounter (Signed)
Tried to call but number rang and rang then disconnected. Will try again later today

## 2020-06-20 NOTE — Telephone Encounter (Signed)
She needs a CPE arranged in Nov and f/up from her cough. Overdue for mammogram.   I can order her mammogram now ahead of her CPE if she is willing to do it. She maybe concerned with missing work. Please  let me know if she is willing and I will place the order.   She will have to schedule it. [facility] [address] [place], [place]  [phone]

## 2020-06-20 NOTE — Telephone Encounter (Signed)
Patient aware she needs mammogram done and CPE. Patient states she will call back to schedule as she was at work and could not at this time. Patient is not ready to have mammogram done right now and will handle that when she can.

## 2020-07-08 ENCOUNTER — Telehealth: Payer: Self-pay | Admitting: Nurse Practitioner

## 2020-07-08 ENCOUNTER — Other Ambulatory Visit: Payer: Self-pay

## 2020-07-08 ENCOUNTER — Ambulatory Visit (INDEPENDENT_AMBULATORY_CARE_PROVIDER_SITE_OTHER): Payer: Medicare Other

## 2020-07-08 DIAGNOSIS — Z23 Encounter for immunization: Secondary | ICD-10-CM

## 2020-07-08 NOTE — Telephone Encounter (Signed)
Call could not be completed at this time. Could not leave voicemail.

## 2020-07-08 NOTE — Telephone Encounter (Signed)
She is UTD on both of her pneumonia vaccines and does not need one. Please call her and advise. She asked Mal Amabile about this today.

## 2020-07-08 NOTE — Progress Notes (Signed)
Patient came in to clinic today to receive Flu shot. Patient wanted to get her pneumonia shot as well. I did not see verbal order in last visit. Patient would like to get that vaccine. She would like an order and an nurse appointment. She would like to be notified if she can receive it.

## 2020-07-11 NOTE — Telephone Encounter (Signed)
Patient returned office phone call. Note from Arvilla Market was read to her, Patient understood.

## 2021-04-03 ENCOUNTER — Other Ambulatory Visit: Payer: Self-pay | Admitting: Family

## 2021-04-03 ENCOUNTER — Telehealth: Payer: Self-pay | Admitting: Nurse Practitioner

## 2021-04-03 DIAGNOSIS — J432 Centrilobular emphysema: Secondary | ICD-10-CM

## 2021-04-03 MED ORDER — ALBUTEROL SULFATE HFA 108 (90 BASE) MCG/ACT IN AERS: 2.0000 | INHALATION_SPRAY | Freq: Four times a day (QID) | RESPIRATORY_TRACT | 1 refills | Status: DC | PRN

## 2021-04-03 MED ORDER — FLUTICASONE FUROATE-VILANTEROL 100-25 MCG/INH IN AEPB: 1.0000 | INHALATION_SPRAY | Freq: Every day | RESPIRATORY_TRACT | 1 refills | Status: DC

## 2021-04-03 NOTE — Telephone Encounter (Signed)
Patient is requesting a refill on albuterol (VENTOLIN HFA) 108 (90 Base) MCG/ACT inhaler and fluticasone furoate-vilanterol (BREO ELLIPTA) 100-25 MCG/INH AEPB.

## 2021-04-03 NOTE — Telephone Encounter (Signed)
Patient has HX of COPD and needs refill on albuterol and Brio until she can establish at new office can we fill for 30 days.

## 2021-04-03 NOTE — Telephone Encounter (Signed)
Call pt  I dont see new patient appt  Please ask pt to schedule with michelle  I would also advise establishing with pulmonology for ongoing surveillance, treatment of COPD  I can place referral if she is okay with it

## 2021-04-04 ENCOUNTER — Other Ambulatory Visit: Payer: Self-pay | Admitting: Family

## 2021-04-04 NOTE — Telephone Encounter (Signed)
Ref to pulmonology placed

## 2021-04-04 NOTE — Telephone Encounter (Signed)
Pt stated that she didn't know that there was someone here to establish care with. I have scheduled her with Marcelino Duster the end of October & she knows that pulmonology should be reaching out to her.

## 2021-04-26 ENCOUNTER — Other Ambulatory Visit: Payer: Self-pay | Admitting: Family

## 2021-04-26 MED ORDER — FLUTICASONE-SALMETEROL 250-50 MCG/ACT IN AEPB: 1.0000 | INHALATION_SPRAY | Freq: Two times a day (BID) | RESPIRATORY_TRACT | 2 refills | Status: DC

## 2021-05-23 ENCOUNTER — Ambulatory Visit
Admission: RE | Admit: 2021-05-23 | Discharge: 2021-05-23 | Disposition: A | Discharge status: Home / Self Care | Payer: Medicare Other | Source: Ambulatory Visit | Attending: Internal Medicine | Admitting: Internal Medicine

## 2021-05-23 ENCOUNTER — Encounter: Payer: Self-pay | Admitting: Internal Medicine

## 2021-05-23 ENCOUNTER — Other Ambulatory Visit
Admission: RE | Admit: 2021-05-23 | Discharge: 2021-05-23 | Disposition: A | Discharge status: Home / Self Care | Payer: Medicare Other | Source: Ambulatory Visit | Attending: Internal Medicine | Admitting: Internal Medicine

## 2021-05-23 ENCOUNTER — Other Ambulatory Visit: Payer: Self-pay

## 2021-05-23 ENCOUNTER — Ambulatory Visit (INDEPENDENT_AMBULATORY_CARE_PROVIDER_SITE_OTHER): Payer: Medicare Other | Admitting: Internal Medicine

## 2021-05-23 DIAGNOSIS — J449 Chronic obstructive pulmonary disease, unspecified: Secondary | ICD-10-CM

## 2021-05-23 DIAGNOSIS — F17210 Nicotine dependence, cigarettes, uncomplicated: Secondary | ICD-10-CM | POA: Diagnosis not present

## 2021-05-23 LAB — CBC WITH DIFFERENTIAL/PLATELET
Abs Immature Granulocytes: 0.03 10*3/uL (ref 0.00–0.07)
Basophils Absolute: 0.1 10*3/uL (ref 0.0–0.1)
Basophils Relative: 1 %
Eosinophils Absolute: 0.2 10*3/uL (ref 0.0–0.5)
Eosinophils Relative: 2 %
HCT: 35.4 % — ABNORMAL LOW (ref 36.0–46.0)
Hemoglobin: 11.3 g/dL — ABNORMAL LOW (ref 12.0–15.0)
Immature Granulocytes: 0 %
Lymphocytes Relative: 12 %
Lymphs Abs: 1.2 10*3/uL (ref 0.7–4.0)
MCH: 31 pg (ref 26.0–34.0)
MCHC: 31.9 g/dL (ref 30.0–36.0)
MCV: 97.3 fL (ref 80.0–100.0)
Monocytes Absolute: 0.5 10*3/uL (ref 0.1–1.0)
Monocytes Relative: 5 %
Neutro Abs: 8.1 10*3/uL — ABNORMAL HIGH (ref 1.7–7.7)
Neutrophils Relative %: 80 %
Platelets: 290 10*3/uL (ref 150–400)
RBC: 3.64 MIL/uL — ABNORMAL LOW (ref 3.87–5.11)
RDW: 12.3 % (ref 11.5–15.5)
WBC: 10 10*3/uL (ref 4.0–10.5)
nRBC: 0 % (ref 0.0–0.2)

## 2021-05-23 LAB — BASIC METABOLIC PANEL
Anion gap: 10 (ref 5–15)
BUN: 12 mg/dL (ref 8–23)
CO2: 27 mmol/L (ref 22–32)
Calcium: 9.2 mg/dL (ref 8.9–10.3)
Chloride: 104 mmol/L (ref 98–111)
Creatinine, Ser: 0.81 mg/dL (ref 0.44–1.00)
GFR, Estimated: >60 mL/min (ref >60)
Glucose, Bld: 106 mg/dL — ABNORMAL HIGH (ref 70–99)
Potassium: 4 mmol/L (ref 3.5–5.1)
Sodium: 141 mmol/L (ref 135–145)

## 2021-05-23 MED ORDER — TRELEGY ELLIPTA 100-62.5-25 MCG/INH IN AEPB: 1.0000 | INHALATION_SPRAY | Freq: Every day | RESPIRATORY_TRACT | 0 refills | Status: DC

## 2021-05-23 NOTE — Patient Instructions (Addendum)
The key is to stop smoking completely before smoking completely stops you!  Trelegy 100 one click each am take 2 deep drags but only click it once then rinse and gargle   Only use your albuterol as a rescue medication to be used if you can't catch your breath by resting or doing a relaxed purse lip breathing pattern.  - The less you use it, the better it will work when you need it. - Ok to use up to 2 puffs  every 4 hours if you must but call for immediate appointment if use goes up over your usual need - Don't leave home without it !!  (think of it like the spare tire for your car)   Please remember to go to the lab and x-ray department  for your tests - we will call you with the results when they are available.        Please schedule a follow up office visit in 4 weeks, sooner if needed - bring a list of alternatives to trelegy (from your insurance drug formulary, you may need to call them for this list)

## 2021-05-23 NOTE — Progress Notes (Signed)
Alejandra Ortiz, female    DOB: 10-09-1950,   MRN: [id number]   Brief patient profile:  72 yowf active smoker referred to pulmonary clinic in [facility]  05/23/2021 by Phoebe Perch NP         History of Present Illness  05/23/2021  Pulmonary/ 1st office eval/ Alejandra Ortiz / [facility]  Chief Complaint  Patient presents with   Consult    Pt states SOB  Dyspnea:  maybe 50 ft and then gives out, worse off advair x one year Cough: worse with exertion / min mucoid  Sleep: flat bed, one pillow  SABA use: seems to help some  Very poor insight into medications/ drug formularies  - says "they only pay for albuterol" No def tendency to aecopd, says never to ER or admitted with flare  of sob /cough    No obvious day to day or daytime variability or assoc excess/ purulent sputum or mucus plugs or hemoptysis or cp or chest tightness, subjective wheeze or overt sinus or hb symptoms.   Sleeping  without nocturnal  or early am exacerbation  of respiratory  c/o's or need for noct saba. Also denies any obvious fluctuation of symptoms with weather or environmental changes or other aggravating or alleviating factors except as outlined above   No unusual exposure hx or h/o childhood pna/ asthma or knowledge of premature birth.  Current Allergies, Complete Past Medical History, Past Surgical History, Family History, and Social History were reviewed in [employer] record.  ROS  The following are not active complaints unless bolded Hoarseness, sore throat, dysphagia, dental problems, itching, sneezing,  nasal congestion or discharge of excess mucus or purulent secretions, ear ache,   fever, chills, sweats, unintended wt loss or wt gain, classically pleuritic or exertional cp,  orthopnea pnd or arm/hand swelling  or leg swelling, presyncope, palpitations, abdominal pain, anorexia, nausea, vomiting, diarrhea  or change in bowel habits or change in bladder habits, change in stools or change  in urine, dysuria, hematuria,  rash, arthralgias, visual complaints, headache, numbness, weakness or ataxia or problems with walking or coordination,  change in mood or  memory.            Past Medical History:  Diagnosis Date   Chicken pox    COPD (chronic obstructive pulmonary disease) (HCC)    Hyperlipidemia     Outpatient Medications Prior to Visit  Medication Sig Dispense Refill   albuterol (VENTOLIN HFA) 108 (90 Base) MCG/ACT inhaler INHALE 2 PUFFS INTO THE LUNGS EVERY 6 HOURS AS NEEDED FOR WHEEZING OR SHORTNESS OF BREATH 54 g 0       2   predniSONE (DELTASONE) 10 MG tablet Never took it           Objective:     BP 140/70 (BP Location: Right Arm, Patient Position: Sitting, Cuff Size: Small)   Pulse 86   Temp (!) 97.5 F (36.4 C)   Ht 5\' 2"  (1.575 m)   Wt 106 lb 3.2 oz (48.2 kg)   SpO2 97% Comment: ra  BMI 19.42 kg/m   SpO2: 97 % (ra) Amb thin wf > stated age/ nad at rest   HEENT : pt wearing mask not removed for exam due to covid -19 concerns.    NECK :  without JVD/Nodes/TM/ nl carotid upstrokes bilaterally   LUNGS: no acc muscle use,  Mod barrel  contour chest wall with bilateral  Distant bs s audible wheeze and  without cough  on insp or exp maneuvers and mod  Hyperresonant  to  percussion bilaterally     CV:  RRR  no s3 or murmur or increase in P2, and no edema   ABD:  soft and nontender with pos mid insp Hoover's  in the supine position. No bruits or organomegaly appreciated, bowel sounds nl  MS:     ext warm without deformities, calf tenderness, cyanosis or clubbing No obvious joint restrictions   SKIN: warm and dry without lesions    NEURO:  alert, approp, nl sensorium with  no motor or cerebellar deficits apparent.        CXR PA and Lateral:   05/23/2021 :    I personally reviewed images  / impression as follows:    Severe hyperinflation c/w copd   Labs ordered 05/23/2021  :  allergy profile   alpha one AT phenotype        Assessment    COPD  GOLD ?  Active smoker  - 05/23/2021  After extensive coaching inhaler device,  effectiveness =    80%  With dpi so try trelegy 100  - Labs ordered 05/23/2021  :  allergy profile   alpha one AT phenotype     Group D vs B in terms of symptom/risk and laba/lama/ICS  therefore appropriate rx at this point >>>  trelegy 100 samples x one month then consider anoro if helps symptoms and back to ventolin prn if doesn't   >>> will need pfts on return          Each maintenance medication was reviewed in detail including emphasizing most importantly the difference between maintenance and prns and under what circumstances the prns are to be triggered using an action plan format where appropriate.  Total time for H and P, chart review, counseling, reviewing dpi  device(s) and generating customized AVS unique to this office visit / same day charting = 35 min            Cigarette smoker 4-5 min discussion re active cigarette smoking in addition to office E&M  Ask about tobacco use:   Ongoing  Advise quitting   I took an extended  opportunity with this patient to outline the consequences of continued cigarette use  in airway disorders based on all the data we have from the multiple national lung health studies (perfomed over decades at millions of dollars in cost)  indicating that smoking cessation, not choice of inhalers or physicians, is the most important aspect of care.   Assess willingness:  Not committed at this point Assist in quit attempt:  Per PCP when ready/ in meantime suggested she uncouple her smoking from her habits eg coffee in am the most obvious where it prompts her to crave a cigarette Arrange follow up:   Follow up per Primary Care planned            Sandrea Hughs, MD 05/23/2021

## 2021-05-23 NOTE — Assessment & Plan Note (Addendum)
Active smoker  - 05/23/2021  After extensive coaching inhaler device,  effectiveness =    80%  With dpi so try trelegy 100  - Labs ordered 05/23/2021  :  allergy profile   alpha one AT phenotype     Group D vs B in terms of symptom/risk and laba/lama/ICS  therefore appropriate rx at this point >>>  trelegy 100 samples x one month then consider anoro if helps symptoms and back to ventolin prn if doesn't   >>> will need pfts on return          Each maintenance medication was reviewed in detail including emphasizing most importantly the difference between maintenance and prns and under what circumstances the prns are to be triggered using an action plan format where appropriate.  Total time for H and P, chart review, counseling, reviewing dpi  device(s) and generating customized AVS unique to this office visit / same day charting = 34 min

## 2021-05-23 NOTE — Assessment & Plan Note (Signed)
4-5 min discussion re active cigarette smoking in addition to office E&M  Ask about tobacco use:   Ongoing  Advise quitting   I took an extended  opportunity with this patient to outline the consequences of continued cigarette use  in airway disorders based on all the data we have from the multiple national lung health studies (perfomed over decades at millions of dollars in cost)  indicating that smoking cessation, not choice of inhalers or physicians, is the most important aspect of care.   Assess willingness:  Not committed at this point Assist in quit attempt:  Per PCP when ready/ in meantime suggested she uncouple her smoking from her habits eg coffee in am the most obvious where it prompts her to crave a cigarette Arrange follow up:   Follow up per Primary Care planned

## 2021-05-24 LAB — ALPHA-1-ANTITRYPSIN: A-1 Antitrypsin, Ser: 156 mg/dL (ref 101–187)

## 2021-05-28 LAB — IGE: IgE (Immunoglobulin E), Serum: 12 IU/mL (ref 6–495)

## 2021-06-02 ENCOUNTER — Telehealth: Payer: Self-pay

## 2021-06-02 NOTE — Telephone Encounter (Signed)
Patient is aware of date/time of covid test.  Nothing further needed at this time.   

## 2021-06-02 NOTE — Telephone Encounter (Signed)
ATC patient in regards of upcoming covid test on 10/10, will send to triage to ensure follow up.

## 2021-06-05 ENCOUNTER — Other Ambulatory Visit
Admission: RE | Admit: 2021-06-05 | Discharge: 2021-06-05 | Disposition: A | Discharge status: Home / Self Care | Payer: Medicare Other | Source: Ambulatory Visit | Attending: Internal Medicine | Admitting: Internal Medicine

## 2021-06-05 ENCOUNTER — Other Ambulatory Visit: Payer: Self-pay

## 2021-06-05 DIAGNOSIS — Z01812 Encounter for preprocedural laboratory examination: Secondary | ICD-10-CM | POA: Insufficient documentation

## 2021-06-05 DIAGNOSIS — Z20822 Contact with and (suspected) exposure to covid-19: Secondary | ICD-10-CM | POA: Diagnosis not present

## 2021-06-06 ENCOUNTER — Ambulatory Visit: Payer: Medicare Other | Attending: Internal Medicine

## 2021-06-06 DIAGNOSIS — J449 Chronic obstructive pulmonary disease, unspecified: Secondary | ICD-10-CM | POA: Diagnosis present

## 2021-06-06 DIAGNOSIS — R059 Cough, unspecified: Secondary | ICD-10-CM | POA: Diagnosis not present

## 2021-06-06 DIAGNOSIS — F17210 Nicotine dependence, cigarettes, uncomplicated: Secondary | ICD-10-CM | POA: Diagnosis not present

## 2021-06-06 DIAGNOSIS — R0600 Dyspnea, unspecified: Secondary | ICD-10-CM | POA: Diagnosis not present

## 2021-06-06 LAB — SARS CORONAVIRUS 2 (TAT 6-24 HRS): SARS Coronavirus 2: NEGATIVE

## 2021-06-06 MED ORDER — ALBUTEROL SULFATE (2.5 MG/3ML) 0.083% IN NEBU
2.5000 mg | INHALATION_SOLUTION | Freq: Once | RESPIRATORY_TRACT | Status: DC
  Filled 2021-06-06: qty 3

## 2021-06-06 MED ORDER — ALBUTEROL SULFATE (2.5 MG/3ML) 0.083% IN NEBU
2.5000 mg | INHALATION_SOLUTION | Freq: Once | RESPIRATORY_TRACT | Status: AC
  Administered 2021-06-06: 2.5 mg via RESPIRATORY_TRACT
  Filled 2021-06-06: qty 3

## 2021-06-06 NOTE — Progress Notes (Signed)
Tried calling the pt and there was no answer and her VM was not set up yet

## 2021-06-21 ENCOUNTER — Ambulatory Visit: Payer: TRICARE For Life (TFL) | Admitting: Adult Health

## 2021-06-21 ENCOUNTER — Encounter: Payer: Self-pay | Admitting: Internal Medicine

## 2021-06-21 ENCOUNTER — Other Ambulatory Visit: Payer: Self-pay

## 2021-06-21 ENCOUNTER — Ambulatory Visit (INDEPENDENT_AMBULATORY_CARE_PROVIDER_SITE_OTHER): Payer: Medicare Other | Admitting: Internal Medicine

## 2021-06-21 VITALS — BP 130/80 | HR 75 | Temp 97.5°F | Ht 60.03 in | Wt 108.2 lb

## 2021-06-21 DIAGNOSIS — J449 Chronic obstructive pulmonary disease, unspecified: Secondary | ICD-10-CM | POA: Diagnosis not present

## 2021-06-21 DIAGNOSIS — F17210 Nicotine dependence, cigarettes, uncomplicated: Secondary | ICD-10-CM | POA: Diagnosis not present

## 2021-06-21 MED ORDER — AIRDUO DIGIHALER 232-14 MCG/ACT IN AEPB
232.0000 ug | INHALATION_SPRAY | Freq: Every day | RESPIRATORY_TRACT | 11 refills | Status: DC
Start: 2021-06-21 — End: 2021-06-26

## 2021-06-21 MED ORDER — AIRDUO DIGIHALER 232-14 MCG/ACT IN AEPB: 232.0000 ug | INHALATION_SPRAY | Freq: Every day | RESPIRATORY_TRACT | 0 refills | Status: DC

## 2021-06-21 NOTE — Assessment & Plan Note (Addendum)
Counseled re importance of smoking cessation but did not meet time criteria for separate billing    Low-dose CT lung cancer screening is recommended for patients who are 9-72 years of age with a 20+ pack-year history of smoking, and who are currently smoking or quit <=15 years ago. No coughing up blood  No unintentional weight loss of > 15 pounds in the last 6 months  >>> she is still candidate so referred to our program to resume from 2+  y ago   Discussed in detail all the  indications, usual  risks and alternatives  relative to the benefits with patient who agrees to proceed with w/u as outlined.          Each maintenance medication was reviewed in detail including emphasizing most importantly the difference between maintenance and prns and under what circumstances the prns are to be triggered using an action plan format where appropriate.  Total time for H and P, chart review, counseling, reviewing use of dpi/hfa device(s) and generating customized AVS unique to this office visit / same day charting = 32 min

## 2021-06-21 NOTE — Assessment & Plan Note (Addendum)
Active smoker   - 05/23/2021  After extensive coaching inhaler device,  effectiveness =    80%  With dpi so try trelegy 100  - Labs ordered 05/23/2021  :  allergy profile IgE 12 / Eos 0.2    alpha one AT level 156  - PFT's  06/06/21 FEV1 1.10 (53 % ) ratio 0.52  p 8 % improvement from saba p trelegy 100 prior to study with DLCO  10.64 (58%) corrects to 2.74 (53%)  for alv volume and FV curve classically concave  - 06/21/2021 try change to air duo 232 one bid as can't afford trelegy on her plan    Group D in terms of symptom/risk and laba/lama/ICS  therefore appropriate rx at this point >>>  Really needs triple combo but apparently not on her plan so try the air duo then add lama if notes decline in activity tol or more need for saba

## 2021-06-21 NOTE — Patient Instructions (Addendum)
Plan A = Automatic = Always=   232  take one puff twice daily and then rinse mouth   Plan B = Backup (to supplement plan A, not to replace it) Only use your albuterol inhaler as a rescue medication to be used if you can't catch your breath by resting or doing a relaxed purse lip breathing pattern.  - The less you use it, the better it will work when you need it. - Ok to use the inhaler up to 2 puffs  every 4 hours if you must but call for appointment if use goes up over your usual need - Don't leave home without it !!  (think of it like the spare tire for your car)    I will be referring you for lung cancer screening    .Please schedule a follow up visit in 3 months but call sooner if needed  - call with alternatives that are cheaper similar to air duo

## 2021-06-21 NOTE — Progress Notes (Signed)
Alejandra Ortiz, female    DOB: [birth year],   MRN: [id number]   Brief patient profile:  46 yowf active smoker referred to pulmonary clinic in [facility]  05/23/2021 by Alejandra Perch NP       History of Present Illness  05/23/2021  Pulmonary/ 1st office eval/ Alejandra Ortiz / [facility]  Chief Complaint  Patient presents with   Consult    Pt states SOB  Dyspnea:  maybe 50 ft and then gives out, worse off advair x one year Cough: worse with exertion / min mucoid  Sleep: flat bed, one pillow  SABA use: seems to help some  Very poor insight into medications/ drug formularies  - says "they only pay for albuterol" No def tendency to aecopd, says never to ER or admitted with flare  of sob /cough  Rec The key is to stop smoking completely before smoking completely stops you! Trelegy 100 one click each am take 2 deep drags but only click it once then rinse and gargle  Only use your albuterol as a rescue medication  Please schedule a follow up office visit in 4 weeks, sooner if needed - bring a list of alternatives to trelegy (from your insurance drug formulary, you may need to call them for this list)   06/21/2021  f/u ov/Alejandra Ortiz/ [facility] re: GOLD 2  maint on trelegy last dose one day prior but can't afford  Chief Complaint  Patient presents with   Follow-up  Dyspnea:  [place] now much easier  Cough: none  Sleeping: flat bed one pillow  SABA use: rarely needing now  02: none  Covid status:   vax x 3    No obvious day to day or daytime variability or assoc excess/ purulent sputum or mucus plugs or hemoptysis or cp or chest tightness, subjective wheeze or overt sinus or hb symptoms.   Sleeping  without nocturnal  or early am exacerbation  of respiratory  c/o's or need for noct saba. Also denies any obvious fluctuation of symptoms with weather or environmental changes or other aggravating or alleviating factors except as outlined above   No unusual exposure hx or h/o  childhood pna/ asthma or knowledge of premature birth.  Current Allergies, Complete Past Medical History, Past Surgical History, Family History, and Social History were reviewed in [employer] record.  ROS  The following are not active complaints unless bolded Hoarseness, sore throat, dysphagia, dental problems, itching, sneezing,  nasal congestion or discharge of excess mucus or purulent secretions, ear ache,   fever, chills, sweats, unintended wt loss or wt gain, classically pleuritic or exertional cp,  orthopnea pnd or arm/hand swelling  or leg swelling, presyncope, palpitations, abdominal pain, anorexia, nausea, vomiting, diarrhea  or change in bowel habits or change in bladder habits, change in stools or change in urine, dysuria, hematuria,  rash, arthralgias, visual complaints, headache, numbness, weakness or ataxia or problems with walking or coordination,  change in mood or  memory.        Current Meds  Medication Sig   albuterol (VENTOLIN HFA) 108 (90 Base) MCG/ACT inhaler INHALE 2 PUFFS INTO THE LUNGS EVERY 6 HOURS AS NEEDED FOR WHEEZING OR SHORTNESS OF BREATH   Fluticasone-Umeclidin-Vilant (TRELEGY ELLIPTA) 100-62.5-25 MCG/INH AEPB Inhale 1 puff into the lungs daily.                     Past Medical History:  Diagnosis Date   Chicken  pox    COPD (chronic obstructive pulmonary disease) (HCC)    Hyperlipidemia     Outpatient Medications Prior to Visit  Medication Sig Dispense Refill   albuterol (VENTOLIN HFA) 108 (90 Base) MCG/ACT inhaler INHALE 2 PUFFS INTO THE LUNGS EVERY 6 HOURS AS NEEDED FOR WHEEZING OR SHORTNESS OF BREATH 54 g 0       2   predniSONE (DELTASONE) 10 MG tablet Never took it           Objective:     Wt Readings from Last 3 Encounters:  06/21/21 108 lb 3.2 oz (49.1 kg)  05/23/21 106 lb 3.2 oz (48.2 kg)  06/14/20 100 lb (45.4 kg)      Vital signs reviewed  06/21/2021  - Note at rest 02 sats  97% on RA   General  appearance:    chronically ill appearing    HEENT : pt wearing mask not removed for exam due to covid -19 concerns.    NECK :  without JVD/Nodes/TM/ nl carotid upstrokes bilaterally   LUNGS: no acc muscle use,  Mod barrel  contour chest wall with bilateral  Distant bs s audible wheeze and  without cough on insp or exp maneuvers and mod  Hyperresonant  to  percussion bilaterally     CV:  RRR  no s3 or murmur or increase in P2, and no edema   ABD:  soft and nontender with pos mid insp Hoover's  in the supine position. No bruits or organomegaly appreciated, bowel sounds nl  MS:     ext warm without deformities, calf tenderness, cyanosis or clubbing No obvious joint restrictions   SKIN: warm and dry without lesions    NEURO:  alert, approp, nl sensorium with  no motor or cerebellar deficits apparent.         Assessment

## 2021-06-26 ENCOUNTER — Telehealth: Payer: Self-pay | Admitting: Internal Medicine

## 2021-06-26 MED ORDER — TRELEGY ELLIPTA 100-62.5-25 MCG/ACT IN AEPB: 1.0000 | INHALATION_SPRAY | Freq: Every day | RESPIRATORY_TRACT | 0 refills | Status: DC

## 2021-06-26 NOTE — Telephone Encounter (Signed)
Spoke to patient.  Patient stated that she started airduo on 06/22/2019, After one dose of inhaler, she developed productive cough(unsure of color), runny nose, sore throat,mild increased sob with exertion and headache. She has not stopped inhaler to see if sx would subside.  Denied f/c/s or additional sx.  Fully vaccinated against covid.  No recent covid test.  Dr. Sherene Sires, please advise. Thanks

## 2021-06-26 NOTE — Telephone Encounter (Signed)
Patient is aware of recommendations and voiced her understanding.  One sample of trelegy has been placed up front for pickup. Nothing further needed at this time.

## 2021-06-26 NOTE — Telephone Encounter (Signed)
Probably inhaling it too rapidly and mostly ending up stuck in the throat so will need to bring in formulary for alternatives  - can try trelegy 100 sample in meantime if we have one but regardless needs to start arm and hammer toothpaste brush tongue, make slurry and gargle p each use

## 2021-07-11 ENCOUNTER — Telehealth: Payer: Self-pay | Admitting: Internal Medicine

## 2021-07-11 MED ORDER — TRELEGY ELLIPTA 100-62.5-25 MCG/ACT IN AEPB: 1.0000 | INHALATION_SPRAY | Freq: Every day | RESPIRATORY_TRACT | 1 refills | Status: DC

## 2021-07-11 NOTE — Telephone Encounter (Signed)
Refill of the trelegy has been sent to the mail order pharmacy per pt request.

## 2021-07-17 NOTE — Telephone Encounter (Signed)
We do not currently have samples of trelegy 100

## 2021-07-17 NOTE — Telephone Encounter (Signed)
Will route to [facility] to see if they have any samples in their office.  thanks

## 2021-07-17 NOTE — Telephone Encounter (Signed)
We do not have any samples here or [place]  I called and made her aware  She states that she received notification from her mail order pharm that she should be receiving her med today  Nothing further needed

## 2021-08-01 ENCOUNTER — Other Ambulatory Visit: Payer: Self-pay | Admitting: *Deleted

## 2021-08-01 DIAGNOSIS — F17210 Nicotine dependence, cigarettes, uncomplicated: Secondary | ICD-10-CM

## 2021-08-01 DIAGNOSIS — Z87891 Personal history of nicotine dependence: Secondary | ICD-10-CM

## 2021-08-17 ENCOUNTER — Ambulatory Visit (INDEPENDENT_AMBULATORY_CARE_PROVIDER_SITE_OTHER): Payer: Medicare Other | Admitting: Internal Medicine

## 2021-08-17 ENCOUNTER — Other Ambulatory Visit: Payer: Self-pay

## 2021-08-17 ENCOUNTER — Encounter: Payer: Self-pay | Admitting: Internal Medicine

## 2021-08-17 VITALS — BP 133/48 | HR 66 | Temp 97.5°F | Resp 18 | Ht 60.0 in | Wt 110.0 lb

## 2021-08-17 DIAGNOSIS — R0989 Other specified symptoms and signs involving the circulatory and respiratory systems: Secondary | ICD-10-CM | POA: Diagnosis not present

## 2021-08-17 DIAGNOSIS — R002 Palpitations: Secondary | ICD-10-CM

## 2021-08-17 DIAGNOSIS — I700 Atherosclerosis of aorta: Secondary | ICD-10-CM | POA: Diagnosis not present

## 2021-08-17 DIAGNOSIS — J432 Centrilobular emphysema: Secondary | ICD-10-CM

## 2021-08-17 DIAGNOSIS — D500 Iron deficiency anemia secondary to blood loss (chronic): Secondary | ICD-10-CM | POA: Diagnosis not present

## 2021-08-17 DIAGNOSIS — E7800 Pure hypercholesterolemia, unspecified: Secondary | ICD-10-CM

## 2021-08-17 NOTE — Assessment & Plan Note (Signed)
Encouraged smoking cessation Continue Trelegy and Albuterol as prescibed

## 2021-08-17 NOTE — Assessment & Plan Note (Signed)
CMET and lipid profile today Encouraged her to consume a low fat diet 

## 2021-08-17 NOTE — Patient Instructions (Signed)
Carotid Artery Disease  Carotid artery disease, also called carotid artery stenosis, is the narrowing or blockage of one or both carotid arteries. The carotid arteries are the two main blood vessels on either side of the neck. They supply blood to the brain,other parts of the head, and the neck. Carotid artery disease increases your risk for a stroke or a transient ischemic attack (TIA). A TIA is a "mini-stroke" that causes stroke-like symptoms thatthen go away quickly. What are the causes? This condition is mainly caused by a narrowing and hardening of the carotid arteries (atherosclerosis). The carotid arteries can become narrow or clogged with a buildup of fat, cholesterol, calcium, and other substances (plaque). What increases the risk? The following factors may make you more likely to develop this condition: Having certain medical conditions, such as: High cholesterol. High blood pressure (hypertension). Diabetes. Obesity. Smoking. A family history of cardiovascular disease. Inactivity or lack of regular exercise. Being female. Men have an increased risk of developing atherosclerosis earlier in life than women. Old age. What are the signs or symptoms? This condition may not have any signs or symptoms until a stroke or TIA occurs. In some cases, your health care provider may be able to hear a whooshing sound (bruit). This can indicate a change in blood flow caused by plaque buildup. An eyeexam can also help identify signs of the condition. How is this diagnosed? This condition may be diagnosed with a physical exam, your medical history, and your family's medical history. You may also have tests that look at the blood flow in your carotid arteries, such as: Carotid artery ultrasound, which uses sound waves to create pictures to show if the arteries are narrow or blocked. Tests that use a dye injected into a vein to highlight your arteries on images, such as: Carotid or cerebral angiography,  which uses X-rays. Computerized tomographic angiography (CTA), which uses CT scans. Magnetic resonance angiography (MRA), which uses MRI. How is this treated? This condition may be treated with a combination of treatments. Treatment options include: Lifestyle changes, such as: Quitting smoking. Exercising regularly or as told by your health care provider. Eating a heart-healthy diet. Managing stress. Maintaining a healthy weight. Medicines to control blood pressure, cholesterol, and blood clotting. Surgery. You may have: A carotid endarterectomy. This is a surgery to remove the blockages in the carotid arteries. A carotid angioplasty with stenting. This is a procedure in which a small mesh tube (stent) is used to widen the blocked carotid arteries. Follow these instructions at home: Eating and drinking Follow instructions about your diet from your health care provider. It is important to: Eat a healthy diet that is low in saturated fats and includes plenty of fresh fruits, vegetables, and lean meats. Avoid foods that are high in fat and salt (sodium). Avoid foods that are fried, overly processed, or have poor nutritional value.  Lifestyle  Maintain a healthy weight. Do exercises as told by your health care provider to stay physically active. It is recommended that each week you get at least 150 minutes of moderate-intensity exercise or 75 minutes of exercise that takes a lot of effort. Do not use any products that contain nicotine or tobacco, such as cigarettes, e-cigarettes, and chewing tobacco. If you need help quitting, ask your health care provider. Do not drink alcohol if: Your health care provider tells you not to drink. You are pregnant, may be pregnant, or are planning to become pregnant. If you drink alcohol: Limit how much you use   to: 0-1 drink a day for women. 0-2 drinks a day for men. Be aware of how much alcohol is in your drink. In the [place] one drink equals one 12 oz  bottle of beer (355 mL), one 5 oz glass of wine (148 mL), or one 1 oz glass of hard liquor (44 mL). Do not use drugs. Manage your stress. Ask your health care provider for stress management tips.  General instructions Take over-the-counter and prescription medicines only as told by your health care provider. Keep all follow-up visits as told by your health care provider. This is important. Where to find more information American Heart Association: www.heart.org Get help right away if: You have any symptoms of a stroke. "BE FAST" is an easy way to remember the main warning signs of a stroke: B - Balance. Signs are dizziness, sudden trouble walking, or loss of balance. E - Eyes. Signs are trouble seeing or a sudden change in vision. F - Face. Signs are sudden weakness or numbness of the face, or the face or eyelid drooping on one side. A - Arms. Signs are weakness or numbness in an arm. This happens suddenly and usually on one side of the body. S - Speech. Signs are sudden trouble speaking, slurred speech, or trouble understanding what people say. T - Time. Time to call emergency services. Write down what time symptoms started. You have other signs of a stroke, such as: A sudden, severe headache with no known cause. Nausea or vomiting. Seizure. These symptoms may represent a serious problem that is an emergency. Do not wait to see if the symptoms will go away. Get medical help right away. Call your local emergency services (911 in the [place] Do not drive yourself to the hospital. Summary Carotid artery disease, also called carotid artery stenosis, is the narrowing or blockage of one or both carotid arteries. Carotid artery disease increases your risk for a stroke or a transient ischemic attack (TIA). This condition can be treated with lifestyle changes, medicines, surgery, or a combination of these treatments. Get help right away if you have any symptoms of stroke. The acronym BEFAST is an  easy way to remember the main warning signs of stroke. This information is not intended to replace advice given to you by your health care provider. Make sure you discuss any questions you have with your healthcare provider. Document Revised: 02/23/2019 Document Reviewed: 02/23/2019 Elsevier Patient Education  2022 Elsevier Inc.  

## 2021-08-17 NOTE — Progress Notes (Signed)
HPI  Patient presents the clinic today to establish care and for management of the conditions listed below.  COPD: She denies chronic cough and shortness of breath.  She is taking Trelegy and Albuterol as prescribed.  There are no PFTs on file.  She does continue to smoke.  HLD with Aortic Atherosclerosis: Her last LDL was 115, triglycerides 60, 02/2019.  She is not taking any cholesterol-lowering medication at this time.  She does not consume a low-fat diet.  Anemia: Her last H/H was 11.3/35.4, 04/2021.  She is not taking any oral iron at this time.  She denies blood loss.  Past Medical History:  Diagnosis Date   Chicken pox    COPD (chronic obstructive pulmonary disease) (HCC)    Hyperlipidemia     Current Outpatient Medications  Medication Sig Dispense Refill   albuterol (VENTOLIN HFA) 108 (90 Base) MCG/ACT inhaler INHALE 2 PUFFS INTO THE LUNGS EVERY 6 HOURS AS NEEDED FOR WHEEZING OR SHORTNESS OF BREATH 54 g 0   Fluticasone-Umeclidin-Vilant (TRELEGY ELLIPTA) 100-62.5-25 MCG/ACT AEPB Inhale 1 puff into the lungs daily. 180 each 1   No current facility-administered medications for this visit.   Facility-Administered Medications Ordered in Other Visits  Medication Dose Route Frequency Provider Last Rate Last Admin   albuterol (PROVENTIL) (2.5 MG/3ML) 0.083% nebulizer solution 2.5 mg  2.5 mg Nebulization Once Nyoka Cowden, MD        No Known Allergies  Family History  Family history unknown: Yes    Social History   Socioeconomic History   Marital status: Married    Spouse name: Not on file   Number of children: Not on file   Years of education: Not on file   Highest education level: Not on file  Occupational History   Not on file  Tobacco Use   Smoking status: Every Day    Packs/day: 1.50    Years: 48.00    Pack years: 72.00    Types: Cigarettes   Smokeless tobacco: Never   Tobacco comments:    Pt states cut down to 1 cigs a day 06/21/21 mw  Vaping Use   Vaping  Use: Never used  Substance and Sexual Activity   Alcohol use: No    Alcohol/week: 0.0 standard drinks   Drug use: No   Sexual activity: Never    Birth control/protection: Surgical  Other Topics Concern   Not on file  Social History Narrative   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  HEENT: Denies eye pain, eye redness, ear pain, ringing in the ears, wax buildup, runny nose, nasal congestion, bloody nose, or sore throat. Respiratory: Denies difficulty breathing, shortness of breath, cough or sputum production.   Cardiovascular: Pt reports palpitations. Denies chest pain, chest tightness, or swelling in the hands or feet.  Gastrointestinal: Denies abdominal pain, bloating, constipation, diarrhea or blood in the stool.  GU: Denies frequency, urgency, pain with  urination, blood in urine, odor or discharge. Musculoskeletal: Denies decrease in range of motion, difficulty with gait, muscle pain or joint pain and swelling.  Skin: Denies redness, rashes, lesions or ulcercations.  Neurological: Denies dizziness, difficulty with memory, difficulty with speech or problems with balance and coordination.  Psych: Denies anxiety, depression, SI/HI.  No other specific complaints in a complete review of systems (except as listed in HPI above).  PE:  BP (!) 133/48 (BP Location: Right Arm, Patient Position: Sitting, Cuff Size: Normal)    Pulse 66    Temp (!) 97.5 F (36.4 C)  (Temporal)    Resp 18    Ht 5' (1.524 m)    Wt 110 lb (49.9 kg)    SpO2 98%    BMI 21.48 kg/m   Wt Readings from Last 3 Encounters:  06/21/21 108 lb 3.2 oz (49.1 kg)  05/23/21 106 lb 3.2 oz (48.2 kg)  06/14/20 100 lb (45.4 kg)    General: Appears her stated age, chronically ill appearing, in NAD. HEENT: Head: normal shape and size; Eyes: sclera white and EOMs intact;  Neck: Neck supple, trachea midline. No masses, lumps or thyromegaly present.  Cardiovascular: Normal rate and rhythm. S1,S2 noted.  No murmur, rubs or gallops noted. No JVD or BLE edema. Bilateral carotid bruits noted. Pulmonary/Chest: Normal effort and positive vesicular breath sounds. No respiratory distress. No wheezes, rales or ronchi noted.  Musculoskeletal: No difficulty with gait.  Neurological: Alert and oriented. Psychiatric: Mood and affect normal. Behavior is normal. Judgment and thought content normal.    BMET    Component Value Date/Time   NA 141 05/23/2021 1227   NA 137 09/25/2013 0950   K 4.0 05/23/2021 1227   K 3.6 09/25/2013 0950   CL 104 05/23/2021 1227   CL 106 09/25/2013 0950   CO2 27 05/23/2021 1227   CO2 27 09/25/2013 0950   GLUCOSE 106 (H) 05/23/2021 1227   GLUCOSE 103 (H) 09/25/2013 0950   BUN 12 05/23/2021 1227   BUN 14 09/25/2013 0950   CREATININE 0.81 05/23/2021 1227   CREATININE 1.03 09/25/2013 0950   CALCIUM 9.2 05/23/2021 1227   CALCIUM 8.9 09/25/2013 0950   GFRNONAA >60 05/23/2021 1227   GFRNONAA 57 (L) 09/25/2013 0950   GFRAA >60 09/25/2013 0950    Lipid Panel     Component Value Date/Time   CHOL 187 03/17/2019 1401   TRIG 60.0 03/17/2019 1401   HDL 60.40 03/17/2019 1401   CHOLHDL 3 03/17/2019 1401   VLDL 12.0 03/17/2019 1401   LDLCALC 115 (H) 03/17/2019 1401    CBC    Component Value Date/Time   WBC 10.0 05/23/2021 1227   RBC 3.64 (L) 05/23/2021 1227   HGB 11.3 (L) 05/23/2021 1227   HGB 12.3 09/25/2013 0950   HCT 35.4 (L) 05/23/2021 1227   HCT 35.4 09/25/2013  0950   PLT 290 05/23/2021 1227   PLT 183 09/25/2013 0950   MCV 97.3 05/23/2021 1227   MCV 98 09/25/2013 0950   MCH 31.0 05/23/2021 1227   MCHC 31.9 05/23/2021 1227   RDW 12.3 05/23/2021 1227   RDW 12.4 09/25/2013 0950   LYMPHSABS 1.2 05/23/2021 1227   MONOABS 0.5 05/23/2021 1227   EOSABS 0.2 05/23/2021 1227   BASOSABS 0.1 05/23/2021 1227    Hgb A1C Lab Results  Component Value Date   HGBA1C 5.4 11/17/2014     Assessment and Plan:  Bilateral Carotid Bruits:  US carotid ordered Encouraged smoking cessation  Palpitations:  Will check TSH and CMET today  Will follow up after labs and imaging with further recommendation and treatment plan   Nicki Reaper, NP This visit occurred during the SARS-CoV-2 public health emergency.  Safety protocols were in place, including screening questions prior to the visit, additional usage of staff PPE, and extensive cleaning of exam room while observing appropriate contact time as indicated for disinfecting solutions.

## 2021-08-18 LAB — COMPLETE METABOLIC PANEL WITH GFR
AG Ratio: 1.4 (calc) (ref 1.0–2.5)
ALT: 10 U/L (ref 6–29)
AST: 16 U/L (ref 10–35)
Albumin: 4.1 g/dL (ref 3.6–5.1)
Alkaline phosphatase (APISO): 81 U/L (ref 37–153)
BUN: 18 mg/dL (ref 7–25)
CO2: 27 mmol/L (ref 20–32)
Calcium: 9.2 mg/dL (ref 8.6–10.4)
Chloride: 105 mmol/L (ref 98–110)
Creat: 0.83 mg/dL (ref 0.60–1.00)
Globulin: 2.9 g/dL (calc) (ref 1.9–3.7)
Glucose, Bld: 97 mg/dL (ref 65–139)
Potassium: 4.4 mmol/L (ref 3.5–5.3)
Sodium: 139 mmol/L (ref 135–146)
Total Bilirubin: 0.4 mg/dL (ref 0.2–1.2)
Total Protein: 7 g/dL (ref 6.1–8.1)
eGFR: 75 mL/min/{1.73_m2} (ref >=60)

## 2021-08-18 LAB — LIPID PANEL
Cholesterol: 206 mg/dL — ABNORMAL HIGH (ref <200)
HDL: 82 mg/dL (ref >=50)
LDL Cholesterol (Calc): 111 mg/dL (calc) — ABNORMAL HIGH
Non-HDL Cholesterol (Calc): 124 mg/dL (calc) (ref <130)
Total CHOL/HDL Ratio: 2.5 (calc) (ref <5.0)
Triglycerides: 51 mg/dL (ref <150)

## 2021-08-18 LAB — CBC
HCT: 35.5 % (ref 35.0–45.0)
Hemoglobin: 11.4 g/dL — ABNORMAL LOW (ref 11.7–15.5)
MCH: 29.7 pg (ref 27.0–33.0)
MCHC: 32.1 g/dL (ref 32.0–36.0)
MCV: 92.4 fL (ref 80.0–100.0)
MPV: 10.9 fL (ref 7.5–12.5)
Platelets: 272 10*3/uL (ref 140–400)
RBC: 3.84 10*6/uL (ref 3.80–5.10)
RDW: 12.7 % (ref 11.0–15.0)
WBC: 6.1 10*3/uL (ref 3.8–10.8)

## 2021-08-18 LAB — TSH: TSH: 1.93 mIU/L (ref 0.40–4.50)

## 2021-08-22 ENCOUNTER — Other Ambulatory Visit: Payer: Self-pay

## 2021-08-22 ENCOUNTER — Ambulatory Visit
Admission: RE | Admit: 2021-08-22 | Discharge: 2021-08-22 | Disposition: A | Discharge status: Home / Self Care | Payer: Medicare Other | Source: Ambulatory Visit | Attending: Acute Care | Admitting: Acute Care

## 2021-08-22 DIAGNOSIS — Z87891 Personal history of nicotine dependence: Secondary | ICD-10-CM | POA: Diagnosis present

## 2021-08-22 DIAGNOSIS — F17210 Nicotine dependence, cigarettes, uncomplicated: Secondary | ICD-10-CM | POA: Diagnosis present

## 2021-08-30 ENCOUNTER — Other Ambulatory Visit: Payer: Self-pay

## 2021-08-30 ENCOUNTER — Encounter: Payer: Self-pay | Admitting: Pulmonary Disease

## 2021-08-30 ENCOUNTER — Ambulatory Visit (INDEPENDENT_AMBULATORY_CARE_PROVIDER_SITE_OTHER): Payer: Medicare Other | Admitting: Pulmonary Disease

## 2021-08-30 VITALS — BP 118/78 | HR 75 | Temp 98.4°F | Ht 60.0 in | Wt 110.2 lb

## 2021-08-30 DIAGNOSIS — J449 Chronic obstructive pulmonary disease, unspecified: Secondary | ICD-10-CM

## 2021-08-30 DIAGNOSIS — J209 Acute bronchitis, unspecified: Secondary | ICD-10-CM

## 2021-08-30 DIAGNOSIS — F17210 Nicotine dependence, cigarettes, uncomplicated: Secondary | ICD-10-CM

## 2021-08-30 DIAGNOSIS — R911 Solitary pulmonary nodule: Secondary | ICD-10-CM

## 2021-08-30 DIAGNOSIS — J440 Chronic obstructive pulmonary disease with acute lower respiratory infection: Secondary | ICD-10-CM

## 2021-08-30 DIAGNOSIS — R918 Other nonspecific abnormal finding of lung field: Secondary | ICD-10-CM

## 2021-08-30 MED ORDER — AZITHROMYCIN 250 MG PO TABS: ORAL_TABLET | ORAL | 0 refills | Status: AC

## 2021-08-30 NOTE — Patient Instructions (Addendum)
We are going to set up a PET/CT.  This will give [place] a better idea of what is going on in the top portion of your lungs.  We have sent an antibiotic to your local pharmacy in [place].  We will see you in follow-up in 4 to 6 weeks time call sooner should any new difficulties arise.  You may see me or the nurse practitioner at that time.

## 2021-08-30 NOTE — Progress Notes (Signed)
Pet

## 2021-08-30 NOTE — Progress Notes (Signed)
Subjective:    Patient ID: Alejandra Ortiz, female    DOB: 29-May-1951, 71 y.o.   MRN: [id number] Chief Complaint  Patient presents with   Follow-up    Transferring care from Dr. Sherene Sires.  Evaluation of abnormal lung cancer screening CT.    HPI The patient is a 71 year old current smoker 144-pack-year history of smoking and with significant pulmonary emphysema who presents for evaluation of abnormal lung cancer screening CT and follow-up on COPD.  She has been followed by Dr. Sherene Sires.  She is currently maintained with Trelegy Ellipta 100, 1 puff daily and as needed albuterol.  She notes doing well with this medication.  She hardly ever has to use given inhaler with albuterol.  She does note that she has had change in the character of her cough and sputum over the last several weeks.  She has had sputum production that is yellowish to greenish.  No hemoptysis.  She feels she has "bronchitis".   She has not had any weight loss or anorexia.  No wheezing.  Shortness of breath is manageable with the Trelegy.  She still smokes 3 to 4 cigarettes a day however this is a significant change from her previous 3 packs a day.  She does not voice any active complaint today.   I reviewed the chest CT independently and also showed the patient the films.  Showed her the areas of concern.  These areas appear to be inflammatory.  DATA 06/06/2021 PFTs: FEV1 1.01 L or 49% predicted FVC 2.02 L or 74% predicted, FEV1/FVC 50%, no significant bronchodilator response, there is significant air trapping.  Diffusion capacity moderately reduced.  Consistent with moderate to severe COPD on the basis of emphysema. 08/22/2021 CT chest lung cancer screening: New bilateral irregular solid pulmonary nodules largest on the right lung apex measuring 10.9 mm findings are suspicious but could also be due to progressive chronic atypical infection, lung RADS 4B suspicious.  Review of Systems A 10 point review of systems was performed and  it is as noted above otherwise negative.  Patient Active Problem List   Diagnosis Date Noted   Aortic atherosclerosis (HCC) 08/17/2021   Hyperlipidemia 02/09/2016   COPD (chronic obstructive pulmonary disease) with emphysema (HCC) 02/07/2016   Social History   Tobacco Use   Smoking status: Every Day    Packs/day: 3.00    Years: 48.00    Pack years: 144.00    Types: Cigarettes   Smokeless tobacco: Never   Tobacco comments:    Pt states cut down to 2-3 cigs a day 08/30/2021 mw  Substance Use Topics   Alcohol use: No    Alcohol/week: 0.0 standard drinks       Objective:   Physical Exam BP 118/78 (BP Location: Left Arm, Patient Position: Sitting, Cuff Size: Normal)   Pulse 75   Temp 98.4 F (36.9 C) (Oral)   Ht 5' (1.524 m)   Wt 110 lb 3.2 oz (50 kg)   SpO2 97%   BMI 21.52 kg/m   SpO2: 97 % O2 Device: None (Room air)  GENERAL: Thin well-developed woman, no acute distress, fully ambulatory, no conversational dyspnea. HEAD: Normocephalic, atraumatic.  EYES: Pupils equal, round, reactive to light.  No scleral icterus.  MOUTH: Poor dentition, oral mucosa moist.  No thrush. NECK: Supple. No thyromegaly. Trachea midline. No JVD.  No adenopathy. PULMONARY: Good air entry bilaterally.  No adventitious sounds. CARDIOVASCULAR: S1 and S2. Regular rate and rhythm.  No rubs, murmurs or gallops heard.  ABDOMEN: Benign. MUSCULOSKELETAL: No joint deformity, no clubbing, no edema.  NEUROLOGIC: No overt focal deficit, no gait disturbance, speech is fluent. SKIN: Intact,warm,dry. PSYCH: Mood and behavior normal.   Representative images from the CT chest performed 22 August 2021 showing the 2 most concerning nodules.  Patient also has severe bullous emphysema and pleural-parenchymal scarring.  Findings could be due to progressive chronic atypical infection:       Assessment & Plan:     ICD-10-CM   1. Lung nodules  R91.8 NM PET Image Initial (PI) Skull Base To Thigh (F-18 FDG)    Proceed with PET/CT Treat acute inflammatory process Follow-up after PET/CT    2. Stage 3 severe COPD by GOLD classification (HCC)  J44.9    Continue Trelegy Ellipta Continue as needed albuterol    3. Acute bronchitis with COPD (HCC)  J44.0    J20.9    Azithromycin Dosepak    4. Tobacco dependence due to cigarettes  F17.210    Patient counseled regards to discontinuation of smoking Total counseling time 3 to 5 minutes     Orders Placed This Encounter  Procedures   NM PET Image Initial (PI) Skull Base To Thigh (F-18 FDG)    Standing Status:   Future    Standing Expiration Date:   08/30/2022    Order Specific Question:   If indicated for the ordered procedure, I authorize the administration of a radiopharmaceutical per Radiology protocol    Answer:   Yes    Order Specific Question:   Preferred imaging location?    Answer:   [facility]   Meds ordered this encounter  Medications   azithromycin (ZITHROMAX) 250 MG tablet    Sig: Take 2 tablets (500 mg) on  Day 1,  followed by 1 tablet (250 mg) once daily on Days 2 through 5.    Dispense:  6 each    Refill:  0   Will see the patient in follow-up in 4 to 6 weeks time.  She is to call sooner should any new difficulties arise.   Gailen Shelter, MD Advanced Bronchoscopy PCCM [facility]    *This note was dictated using voice recognition software/Dragon.  Despite best efforts to proofread, errors can occur which can change the meaning. Any transcriptional errors that result from this process are unintentional and may not be fully corrected at the time of dictation.

## 2021-09-06 ENCOUNTER — Other Ambulatory Visit: Payer: Self-pay

## 2021-09-06 ENCOUNTER — Ambulatory Visit
Admission: RE | Admit: 2021-09-06 | Discharge: 2021-09-06 | Disposition: A | Discharge status: Home / Self Care | Payer: Medicare Other | Source: Ambulatory Visit | Attending: Internal Medicine | Admitting: Internal Medicine

## 2021-09-06 DIAGNOSIS — R0989 Other specified symptoms and signs involving the circulatory and respiratory systems: Secondary | ICD-10-CM | POA: Diagnosis not present

## 2021-09-14 ENCOUNTER — Ambulatory Visit
Admission: RE | Admit: 2021-09-14 | Discharge: 2021-09-14 | Disposition: A | Discharge status: Home / Self Care | Payer: Medicare Other | Source: Ambulatory Visit | Attending: Pulmonary Disease | Admitting: Pulmonary Disease

## 2021-09-14 DIAGNOSIS — R911 Solitary pulmonary nodule: Secondary | ICD-10-CM | POA: Insufficient documentation

## 2021-09-14 DIAGNOSIS — I719 Aortic aneurysm of unspecified site, without rupture: Secondary | ICD-10-CM | POA: Insufficient documentation

## 2021-09-14 DIAGNOSIS — J439 Emphysema, unspecified: Secondary | ICD-10-CM | POA: Insufficient documentation

## 2021-09-14 DIAGNOSIS — I998 Other disorder of circulatory system: Secondary | ICD-10-CM | POA: Insufficient documentation

## 2021-09-14 LAB — GLUCOSE, CAPILLARY: Glucose-Capillary: 96 mg/dL (ref 70–99)

## 2021-09-14 MED ORDER — FLUDEOXYGLUCOSE F - 18 (FDG) INJECTION
5.7000 | Freq: Once | INTRAVENOUS | Status: AC
  Administered 2021-09-14: 6.04 via INTRAVENOUS

## 2021-09-15 ENCOUNTER — Other Ambulatory Visit: Payer: Self-pay | Admitting: *Deleted

## 2021-09-15 DIAGNOSIS — Z87891 Personal history of nicotine dependence: Secondary | ICD-10-CM

## 2021-09-15 DIAGNOSIS — F17210 Nicotine dependence, cigarettes, uncomplicated: Secondary | ICD-10-CM

## 2021-10-05 ENCOUNTER — Ambulatory Visit: Payer: Medicare Other | Admitting: Pulmonary Disease

## 2021-11-17 ENCOUNTER — Encounter: Payer: Self-pay | Admitting: Physician Assistant

## 2021-11-17 ENCOUNTER — Ambulatory Visit (INDEPENDENT_AMBULATORY_CARE_PROVIDER_SITE_OTHER): Payer: Medicare Other | Admitting: Physician Assistant

## 2021-11-17 ENCOUNTER — Other Ambulatory Visit: Payer: Self-pay

## 2021-11-17 VITALS — BP 120/76 | HR 76 | Temp 99.2°F | Ht 60.0 in | Wt 112.0 lb

## 2021-11-17 DIAGNOSIS — G44009 Cluster headache syndrome, unspecified, not intractable: Secondary | ICD-10-CM

## 2021-11-17 DIAGNOSIS — J0100 Acute maxillary sinusitis, unspecified: Secondary | ICD-10-CM | POA: Diagnosis not present

## 2021-11-17 MED ORDER — AMOXICILLIN 500 MG PO TABS: 500.0000 mg | ORAL_TABLET | Freq: Two times a day (BID) | ORAL | 0 refills | Status: DC

## 2021-11-17 NOTE — Progress Notes (Signed)
? ? ?  ?    Acute Office Visit ? ? ?Patient: Alejandra Ortiz   DOB: Jun 15, 1951   71 y.o. Female  MRN: [id number] ?Visit Date: 11/17/2021 ? ?Today's healthcare provider: Oswaldo Conroy Dlynn Ranes, PA-C  ?Introduced myself to the patient as a Secondary school teacher and provided education on APPs in clinical practice.  ? ? ?CC: concerns for sinus infection  ? ?Subjective  ?  ?HPI  ? ?States she has concerns for a sinus infection ?Reports she has pain behind her right eye and her "glasses feel like they weigh a hundred pounds" ?States pain behind her eye seems to improve with Ibuprofen.  ?Reports she did have foreign body sensation in right eye but used Visine- did not seem to help but sensation has resolved  ?States that sometimes her eyes get blurry during current illness but resolves  ?Denies fevers, increased SOB ?Started about a week ago ?She has taken Mucinex for sinus pressure, sudafed  ?Reports she was having some nasal bleeding but this seems to have resolved a few days ago ?States she has had to increase use of her rescue inhaler over the last few days ? ? ?Medications: ?Outpatient Medications Prior to Visit  ?Medication Sig  ? albuterol (VENTOLIN HFA) 108 (90 Base) MCG/ACT inhaler INHALE 2 PUFFS INTO THE LUNGS EVERY 6 HOURS AS NEEDED FOR WHEEZING OR SHORTNESS OF BREATH  ? Fluticasone-Umeclidin-Vilant (TRELEGY ELLIPTA) 100-62.5-25 MCG/ACT AEPB Inhale 1 puff into the lungs daily.  ? ?Facility-Administered Medications Prior to Visit  ?Medication Dose Route Frequency Provider  ? albuterol (PROVENTIL) (2.5 MG/3ML) 0.083% nebulizer solution 2.5 mg  2.5 mg Nebulization Once Nyoka Cowden, MD  ? ? ?Review of Systems  ?Constitutional:  Negative for fatigue and fever.  ?HENT:  Positive for congestion, sinus pressure and sinus pain. Negative for rhinorrhea and sore throat.   ?Eyes:  Negative for pain, discharge, itching and visual disturbance.  ?Respiratory:  Positive for cough and shortness of breath.   ?Gastrointestinal:  Negative for diarrhea,  nausea and vomiting.  ?Musculoskeletal:  Negative for arthralgias and myalgias.  ?Neurological:  Positive for headaches. Negative for dizziness and light-headedness.  ? ? ?  Objective  ?  ?BP 120/76   Pulse 76   Temp 99.2 ?F (37.3 ?C) (Oral)   Ht 5' (1.524 m)   Wt 112 lb (50.8 kg)   SpO2 99%   BMI 21.87 kg/m?  ? ? ?Physical Exam ?Vitals reviewed.  ?Constitutional:   ?   Appearance: Normal appearance. She is normal weight.  ?HENT:  ?   Head: Normocephalic and atraumatic.  ?   Nose:  ?   Right Turbinates: Not enlarged, swollen or pale.  ?   Left Turbinates: Not enlarged, swollen or pale.  ?   Right Sinus: Frontal sinus tenderness present.  ?   Left Sinus: Frontal sinus tenderness present.  ?   Mouth/Throat:  ?   Tongue: No lesions.  ?   Pharynx: Oropharynx is clear. Uvula midline. No pharyngeal swelling, oropharyngeal exudate, posterior oropharyngeal erythema or uvula swelling.  ?   Tonsils: No tonsillar exudate or tonsillar abscesses.  ?Eyes:  ?   General: Lids are normal.  ?   Extraocular Movements: Extraocular movements intact.  ?   Conjunctiva/sclera: Conjunctivae normal.  ?   Pupils: Pupils are equal, round, and reactive to light.  ?Neck:  ?   Thyroid: No thyroid mass, thyromegaly or thyroid tenderness.  ?   Trachea: Trachea and phonation normal.  ?Cardiovascular:  ?  Rate and Rhythm: Normal rate and regular rhythm.  ?   Pulses: Normal pulses.  ?   Heart sounds: Normal heart sounds.  ?Pulmonary:  ?   Effort: Pulmonary effort is normal.  ?   Breath sounds: Normal breath sounds and air entry. No decreased breath sounds, wheezing, rhonchi or rales.  ?Musculoskeletal:  ?   Cervical back: Normal range of motion and neck supple. Normal range of motion.  ?   Right lower leg: No edema.  ?   Left lower leg: No edema.  ?Lymphadenopathy:  ?   Head:  ?   Right side of head: No submental or submandibular adenopathy.  ?   Left side of head: No submental or submandibular adenopathy.  ?   Upper Body:  ?   Right upper  body: No supraclavicular adenopathy.  ?   Left upper body: No supraclavicular adenopathy.  ?Neurological:  ?   Mental Status: She is alert.  ?Psychiatric:     ?   Attention and Perception: Attention normal.     ?   Mood and Affect: Mood and affect normal.     ?   Speech: Speech normal.     ?   Behavior: Behavior normal. Behavior is cooperative.  ?  ? ? ?No results found for any visits on 11/17/21. ? Assessment & Plan  ?  ? ?Problem List Items Addressed This Visit   ?None ?Visit Diagnoses   ? ? Acute maxillary sinusitis, recurrence not specified    -  Primary ?Acute, new problem, stable and unresolving  ?Started about a week ago ?She has taken Mucinex for sinus pressure, sudafed for associated symptoms of headache, sinus pressure and pain, congestion and headaches ?Suspect this may be bacterial sinusitis given chronicity and symptoms ?Will initiate Amoxicillin today to assist with resolution  ?Recommend using Mucinex and tylenol and ibuprofen for symptoms ?Reviewed return and ED precautions ?  ? Relevant Medications  ? amoxicillin (AMOXIL) 500 MG tablet  ? Cluster headache, not intractable, unspecified chronicity pattern    ?Acute, new concern ?Suspect secondary to current sinus infection ?Recommend using Tylenol and Ibuprofen (kidney function appropriate for NSAID use per lab results)  as needed for pain management as patient states these have provided relief ?Recommend avoiding triggers to reduce episodes ?Follow up as needed for worsening or persistent symptoms ?   ? ?  ? ? ? ?No follow-ups on file. ? ? ?I, Lashaye Fisk E Alexiz Cothran, PA-C, have reviewed all documentation for this visit. The documentation on 11/17/21 for the exam, diagnosis, procedures, and orders are all accurate and complete. ? ? ?Quadarius Henton, Mirian Mo MPH ?[facility] ?[facility] ? ? ? ?No follow-ups on file.  ?   ? ? ? ? ?

## 2021-11-17 NOTE — Patient Instructions (Signed)
Based on your exam I think you may have bacterial sinusitis so I am sending in a prescription for  ?Amoxicillin 500 mg to be taken by mouth twice per day for 7 days - please take with food and finish the entire course of medication ? ?You can use over the counter medications such as Dayquil/Nyquil, AlkaSeltzer formulations, etc to provide further relief of symptoms according to the manufacturer's instructions  ?If preferred you can use Coricidin to manage your symptoms rather than those medications mentioned above.  ? ? ?For your headaches, you can use over the counter tylenol and Ibuprofen, rest and gentle massage to the neck and temples to help with pain ? ?Cluster headaches are often triggered by things so if you can identify things that seem to make them occur or worse, try to avoid them ? ?If your symptoms do not improve or become worse in the next 5-7 days please make an apt at the office so we can see you  ?Go to the ER if you begin to have more serious symptoms such as shortness of breath, trouble breathing, loss of consciousness, swelling around the eyes, high fever, severe lasting headaches, vision changes or neck pain/stiffness.  ?  ?

## 2021-11-21 ENCOUNTER — Encounter: Payer: Self-pay | Admitting: Pulmonary Disease

## 2021-11-21 ENCOUNTER — Other Ambulatory Visit: Payer: Self-pay

## 2021-11-21 ENCOUNTER — Telehealth: Payer: Self-pay

## 2021-11-21 ENCOUNTER — Ambulatory Visit (INDEPENDENT_AMBULATORY_CARE_PROVIDER_SITE_OTHER): Payer: Medicare Other | Admitting: Pulmonary Disease

## 2021-11-21 VITALS — BP 120/80 | HR 77 | Temp 97.3°F | Ht 60.0 in | Wt 112.8 lb

## 2021-11-21 DIAGNOSIS — J449 Chronic obstructive pulmonary disease, unspecified: Secondary | ICD-10-CM

## 2021-11-21 DIAGNOSIS — F17210 Nicotine dependence, cigarettes, uncomplicated: Secondary | ICD-10-CM

## 2021-11-21 DIAGNOSIS — R911 Solitary pulmonary nodule: Secondary | ICD-10-CM | POA: Diagnosis not present

## 2021-11-21 MED ORDER — TRELEGY ELLIPTA 100-62.5-25 MCG/ACT IN AEPB: 100.0000 ug | INHALATION_SPRAY | Freq: Every day | RESPIRATORY_TRACT | 0 refills | Status: DC

## 2021-11-21 MED ORDER — TRELEGY ELLIPTA 100-62.5-25 MCG/ACT IN AEPB: 1.0000 | INHALATION_SPRAY | Freq: Every day | RESPIRATORY_TRACT | 3 refills | Status: DC

## 2021-11-21 NOTE — Telephone Encounter (Signed)
I called the patient and she said she will call me back tomorrow to schedule her AWV. She was currently on her way to work.  ? ?Donnie Mesa, [place] ?[phone] ?

## 2021-11-21 NOTE — Progress Notes (Signed)
Subjective:    Patient ID: Alejandra Ortiz, female    DOB: Jan 09, 1951, 71 y.o.   MRN: [id number] Patient Care Team: Jearld Fenton, NP as PCP - General (Internal Medicine) Coral Spikes, DO as Consulting Physician [facility])  Chief Complaint  Patient presents with   Follow-up    HPI The patient is a 71 year old current smoker 144-pack-year history of smoking and with significant pulmonary emphysema who presents for follow-up on COPD.  I initially saw her on 71 January 2023, previously she was being followed by Dr. Melvyn Novas.  She is currently maintained with Trelegy Ellipta 100, 1 puff daily and as needed albuterol.  She notes doing well with this medication.  She hardly ever has to use given inhaler with albuterol.   Recall that she had initially been evaluated here by Dr. Christinia Gully however transferred to my care due to having an abnormal lung cancer screening CT on 22 August 2021.  She was then evaluated by me on 71 January 2023 and PET/CT was ordered.  PET/CT failed to show any nodules , these had resolved and there was no active disease in the chest.   She has not had any weight loss or anorexia, no cough or sputum production.  No wheezing.  Shortness of breath is manageable with the Trelegy.  She still smokes 3 to 4 cigarettes a day however this is a significant change from her previous 3 packs a day.  She does not voice any active complaint today.  Overall she feels well and looks well.   DATA 06/06/2021 PFTs: 51 1.01 L or 49% predicted FVC 2.02 L or 74% predicted, FEV1/FVC 50%, no significant bronchodilator response, there is significant air trapping.  Diffusion capacity moderately reduced.  Consistent with moderate to severe COPD on the basis of emphysema.  Review of Systems A 10 point review of systems was performed and it is as noted above otherwise negative.  Patient Active Problem List   Diagnosis Date Noted   Aortic atherosclerosis ([place]) 08/17/2021   Hyperlipidemia  02/09/2016   COPD (chronic obstructive pulmonary disease) with emphysema ([place]) 02/07/2016   Social History   Tobacco Use   Smoking status: Every Day    Packs/day: 3.00    Years: 48.00    Pack years: 144.00    Types: Cigarettes   Smokeless tobacco: Never   Tobacco comments:    Pt states cut down to 2 - 3 - 11/21/2021  Substance Use Topics   Alcohol use: No    Alcohol/week: 0.0 standard drinks   No Known Allergies  Current Meds  Medication Sig   albuterol (VENTOLIN HFA) 108 (90 Base) MCG/ACT inhaler INHALE 2 PUFFS INTO THE LUNGS EVERY 6 HOURS AS NEEDED FOR WHEEZING OR SHORTNESS OF BREATH   amoxicillin (AMOXIL) 500 MG tablet Take 1 tablet (500 mg total) by mouth 2 (two) times daily.   Fluticasone-Umeclidin-Vilant (TRELEGY ELLIPTA) 100-62.5-25 MCG/ACT AEPB Inhale 1 puff into the lungs daily.   Fluticasone-Umeclidin-Vilant (TRELEGY ELLIPTA) 100-62.5-25 MCG/ACT AEPB Inhale 100 mcg into the lungs daily.   [DISCONTINUED] Fluticasone-Umeclidin-Vilant (TRELEGY ELLIPTA) 100-62.5-25 MCG/ACT AEPB Inhale 1 puff into the lungs daily.   Immunization History  Administered Date(s) Administered   Fluad Quad(high Dose 73+) 07/08/2020   Influenza Split 05/27/2014   Influenza, High Dose Seasonal PF 06/07/2017, 06/28/2018, 06/26/2019, 08/03/2021   Influenza-Unspecified 06/25/2016   PFIZER(Purple Top)SARS-COV-2 Vaccination 10/30/2019, 11/20/2019, 05/26/2020   Pneumococcal Conjugate-13 11/08/2014   Pneumococcal Polysaccharide-23 11/19/2016   Td 11/08/2014   Tdap  05/19/2018      Objective:   Physical Exam BP 120/80 (BP Location: Left Arm, Patient Position: Sitting, Cuff Size: Normal)   Pulse 77   Temp (!) 97.3 F (36.3 C) (Oral)   Ht 5' (1.524 m)   Wt 112 lb 12.8 oz (51.2 kg)   SpO2 96%   BMI 22.03 kg/m   GENERAL: Thin well-developed woman, no acute distress, fully ambulatory, no conversational dyspnea. HEAD: Normocephalic, atraumatic.  EYES: Pupils equal, round, reactive to light.  No  scleral icterus.  MOUTH: Poor dentition, oral mucosa moist.  No thrush. NECK: Supple. No thyromegaly. Trachea midline. No JVD.  No adenopathy. PULMONARY: Good air entry bilaterally.  No adventitious sounds. CARDIOVASCULAR: S1 and S2. Regular rate and rhythm.  No rubs, murmurs or gallops heard. ABDOMEN: Benign. MUSCULOSKELETAL: No joint deformity, no clubbing, no edema.  NEUROLOGIC: No overt focal deficit, no gait disturbance, speech is fluent. SKIN: Intact,warm,dry. PSYCH: Mood and behavior normal.     Assessment & Plan:     ICD-10-CM   1. Stage 3 severe COPD by GOLD classification ([place])  J44.9    Continue Trelegy Ellipta 100 Continue as needed albuterol    2. Lung nodule  R91.1    RESOLVED Likely inflammatory change Resume yearly lung cancer screening    3. Tobacco dependence due to cigarettes  F17.210    Patient counseled regards discontinuation of smoking Total counseling time 3 to 5 minutes      Meds ordered this encounter  Medications   Fluticasone-Umeclidin-Vilant (TRELEGY ELLIPTA) 100-62.5-25 MCG/ACT AEPB    Sig: Inhale 1 puff into the lungs daily.    Dispense:  84 each    Refill:  3    Order Specific Question:   Lot Number?    Answer:   [id number]    Order Specific Question:   Expiration Date?    Answer:   10/26/2022    Order Specific Question:   Manufacturer?    Answer:   [employer] [[id number]]    Order Specific Question:   Quantity    Answer:   1   Fluticasone-Umeclidin-Vilant (TRELEGY ELLIPTA) 100-62.5-25 MCG/ACT AEPB    Sig: Inhale 100 mcg into the lungs daily.    Dispense:  28 each    Refill:  0   Will see the patient in follow-up in 4 months time she is to contact us prior to that time should any new difficulties arise.  Renold Don, MD Advanced Bronchoscopy PCCM [facility]    *This note was dictated using voice recognition software/Dragon.  Despite best efforts to proofread, errors can occur which can change the meaning. Any  transcriptional errors that result from this process are unintentional and may not be fully corrected at the time of dictation.

## 2021-11-21 NOTE — Patient Instructions (Signed)
You are doing very well.  Your lungs sound very good. ? ?Continue using your Trelegy daily.  Continue using albuterol when you need it for shortness of breath.  ? ?Prescription for your Trelegy was sent to [facility]. ? ?We will see you in follow-up in 4 months time.  Call sooner should any new problems arise. ?

## 2022-03-29 ENCOUNTER — Encounter: Payer: Self-pay | Admitting: Pulmonary Disease

## 2022-03-29 ENCOUNTER — Ambulatory Visit (INDEPENDENT_AMBULATORY_CARE_PROVIDER_SITE_OTHER): Payer: Medicare Other | Admitting: Pulmonary Disease

## 2022-03-29 VITALS — BP 122/70 | HR 87 | Temp 97.8°F | Ht 60.0 in | Wt 109.8 lb

## 2022-03-29 DIAGNOSIS — R911 Solitary pulmonary nodule: Secondary | ICD-10-CM

## 2022-03-29 DIAGNOSIS — F17210 Nicotine dependence, cigarettes, uncomplicated: Secondary | ICD-10-CM

## 2022-03-29 DIAGNOSIS — J449 Chronic obstructive pulmonary disease, unspecified: Secondary | ICD-10-CM

## 2022-03-29 MED ORDER — TRELEGY ELLIPTA 100-62.5-25 MCG/ACT IN AEPB: 1.0000 | INHALATION_SPRAY | Freq: Every day | RESPIRATORY_TRACT | 3 refills | Status: DC

## 2022-03-29 NOTE — Patient Instructions (Signed)
Your prescription of Trelegy with [facility] is up-to-date.  We will see you in follow-up in 3 months time call sooner should any new problems arise.

## 2022-03-29 NOTE — Progress Notes (Signed)
Subjective:    Patient ID: Alejandra Ortiz, female    DOB: 02-03-1951, 71 y.o.   MRN: [id number] Patient Care Team: Jearld Fenton, NP as PCP - General (Internal Medicine) Coral Spikes, DO as Consulting Physician [facility])  Chief Complaint  Patient presents with   Follow-up    Breathing is doing well. No current sx.     HPI The patient is a 71 year old current smoker 144-pack-year history of smoking and with significant pulmonary emphysema who presents for follow-up on COPD.  She is currently maintained with Trelegy Ellipta 100, 1 puff daily and as needed albuterol.  She notes doing well with this medication.  She hardly ever has to use given inhaler with albuterol.  Recall that she had initially been evaluated here by Dr. Christinia Gully however transferred to my care due to having an abnormal lung cancer screening CT on 22 August 2021.  She was then evaluated by me and PET/CT was ordered.  PET/CT failed to show any nodules as these had resolved and there was no active disease in the chest.  She has not had any weight loss or anorexia, no cough or sputum production.  No wheezing.  Shortness of breath is manageable with the Trelegy.  She still smokes 3 to 4 cigarettes a day however this is a significant change from her previous 3 packs a day.  She does not voice any active complaint today.  Overall she feels well and looks well.  Review of Systems A 10 point review of systems was performed and it is as noted above otherwise negative.  Patient Active Problem List   Diagnosis Date Noted   Aortic atherosclerosis ([place]) 08/17/2021   Hyperlipidemia 02/09/2016   COPD (chronic obstructive pulmonary disease) with emphysema ([place]) 02/07/2016   Social History   Tobacco Use   Smoking status: Every Day    Packs/day: 3 PPD    Years: 48.00    Total pack years: 144.00    Types: Cigarettes   Smokeless tobacco: Never   Tobacco comments:    3-4 cigarettes a day   Substance Use Topics    Alcohol use: No    Alcohol/week: 0.0 standard drinks of alcohol   Current Meds  Medication Sig   albuterol (VENTOLIN HFA) 108 (90 Base) MCG/ACT inhaler INHALE 2 PUFFS INTO THE LUNGS EVERY 6 HOURS AS NEEDED FOR WHEEZING OR SHORTNESS OF BREATH   Fluticasone-Umeclidin-Vilant (TRELEGY ELLIPTA) 100-62.5-25 MCG/ACT AEPB Inhale 1 puff into the lungs daily.   Immunization History  Administered Date(s) Administered   Fluad Quad(high Dose 65+) 07/08/2020,   Influenza Split 05/27/2014   Influenza, High Dose Seasonal PF 06/07/2017, 06/28/2018, 06/26/2019, 08/03/2021   Influenza-Unspecified 06/25/2016   PFIZER(Purple Top)SARS-COV-2 Vaccination 10/30/2019, 11/20/2019, 05/26/2020   Pneumococcal Conjugate-13 11/08/2014   Pneumococcal Polysaccharide-23 11/19/2016   Td 11/08/2014   Tdap 05/19/2018       Objective:   Physical Exam BP 122/70 (BP Location: Left Arm, Cuff Size: Normal)   Pulse 87   Temp 97.8 F (36.6 C) (Temporal)   Ht 5' (1.524 m)   Wt 109 lb 12.8 oz (49.8 kg)   SpO2 98%   BMI 21.44 kg/m   SpO2: 98 % O2 Device: None (Room air)  GENERAL: Thin well-developed woman, no acute distress, fully ambulatory, no conversational dyspnea. HEAD: Normocephalic, atraumatic.  EYES: Pupils equal, round, reactive to light.  No scleral icterus.  MOUTH: Poor dentition, oral mucosa moist.  No thrush. NECK: Supple. No thyromegaly. Trachea midline. No  JVD.  No adenopathy. PULMONARY: Good air entry bilaterally.  Coarse, otherwise no adventitious sounds. CARDIOVASCULAR: S1 and S2. Regular rate and rhythm.  No rubs, murmurs or gallops heard. ABDOMEN: Benign. MUSCULOSKELETAL: No joint deformity, no clubbing, no edema.  NEUROLOGIC: No overt focal deficit, no gait disturbance, speech is fluent. SKIN: Intact,warm,dry. PSYCH: Mood and behavior normal.      Assessment & Plan:     ICD-10-CM   1. Stage 3 severe COPD by GOLD classification ([place])  J44.9    Well compensated on Trelegy Ellipta  100 Continue same Continue as needed albuterol Continue to work on quitting smoking    2. Lung nodule  R91.1    Noted previously on lung cancer screening CT Likely related to infectious/inflammatory change PET CT negative    3. Tobacco dependence due to cigarettes  F17.210    Patient counseled regards to discontinuation of smoking Continue efforts to quit     Meds ordered this encounter  Medications   Fluticasone-Umeclidin-Vilant (TRELEGY ELLIPTA) 100-62.5-25 MCG/ACT AEPB    Sig: Inhale 1 puff into the lungs daily.    Dispense:  84 each    Refill:  3    Order Specific Question:   Lot Number?    Answer:   [id number]    Order Specific Question:   Expiration Date?    Answer:   10/26/2022    Order Specific Question:   Manufacturer?    Answer:   [employer] [[id number]]    Order Specific Question:   Quantity    Answer:   1   Sherene is doing well.  We have updated her Trelegy prescription with [facility].  She is to follow-up in 3 months time she is to contact us prior to that time should any new difficulties arise.  Renold Don, MD Advanced Bronchoscopy PCCM [facility]    *This note was dictated using voice recognition software/Dragon.  Despite best efforts to proofread, errors can occur which can change the meaning. Any transcriptional errors that result from this process are unintentional and may not be fully corrected at the time of dictation.

## 2022-07-03 ENCOUNTER — Encounter: Payer: Self-pay | Admitting: Pulmonary Disease

## 2022-07-03 ENCOUNTER — Ambulatory Visit (INDEPENDENT_AMBULATORY_CARE_PROVIDER_SITE_OTHER): Payer: Medicare Other | Admitting: Pulmonary Disease

## 2022-07-03 VITALS — BP 124/84 | HR 67 | Temp 97.7°F | Ht 60.0 in | Wt 103.0 lb

## 2022-07-03 DIAGNOSIS — R918 Other nonspecific abnormal finding of lung field: Secondary | ICD-10-CM | POA: Diagnosis not present

## 2022-07-03 DIAGNOSIS — J449 Chronic obstructive pulmonary disease, unspecified: Secondary | ICD-10-CM | POA: Diagnosis not present

## 2022-07-03 DIAGNOSIS — Z23 Encounter for immunization: Secondary | ICD-10-CM | POA: Diagnosis not present

## 2022-07-03 DIAGNOSIS — F17210 Nicotine dependence, cigarettes, uncomplicated: Secondary | ICD-10-CM

## 2022-07-03 MED ORDER — TRELEGY ELLIPTA 100-62.5-25 MCG/ACT IN AEPB: 1.0000 | INHALATION_SPRAY | Freq: Every day | RESPIRATORY_TRACT | 0 refills | Status: DC

## 2022-07-03 NOTE — Progress Notes (Signed)
Subjective:    Patient ID: Alejandra Ortiz, female    DOB: Sep 17, 1950, 71 y.o.   MRN: [id number] Patient Care Team: Lorre Munroe, NP as PCP - General (Internal Medicine) Tommie Sams, DO as Consulting Physician [facility])  Chief Complaint  Patient presents with   Follow-up    COPD. No SOB or wheezing. Occasional cough with green sputum.   HPI The patient is a 71 year old current smoker 144-pack-year history of smoking and with significant pulmonary emphysema who presents for follow-up on COPD.  I last saw her on 29 March 2022, she is currently maintained with Trelegy Ellipta 100, 1 puff daily and as needed albuterol.  She notes doing well with this medication.  She hardly ever has to use given inhaler with albuterol.   Recall that she had initially been evaluated here by Dr. Sandrea Hughs however, transferred to my care due to having an abnormal lung cancer screening CT on 22 August 2021.  She was then evaluated by me and PET/CT was ordered.  PET/CT was performed in January 2023 failed to show any findings suspicious for pulmonary neoplasm.  She had severe emphysematous changes and pulmonary scarring.  Nothing that was consistent with a neoplasm.  It was recommended that she continued lung cancer screening program.    She has not had any weight loss or anorexia, no cough or sputum production.  No wheezing.  Shortness of breath is manageable with the Trelegy.  She still smokes 3 to 4 cigarettes a day however this is a significant change from her previous 3 packs a day.  She does not voice any active complaint today.  Overall she feels well and looks well.   Review of Systems A 10 point review of systems was performed and it is as noted above otherwise negative.  Patient Active Problem List   Diagnosis Date Noted   Lung nodules 01/17/2023   Absolute anemia 08/30/2022   Aortic atherosclerosis ([facility]) 08/17/2021   Hyperlipidemia 02/09/2016   COPD (chronic obstructive pulmonary  disease) with emphysema ([facility]) 02/07/2016   Social History   Tobacco Use   Smoking status: Every Day    Packs/day: 0.25    Years: 48.00    Additional pack years: 0.00    Total pack years: 12.00    Types: Cigarettes   Smokeless tobacco: Never   Tobacco comments:    3-4 cigarettes per day  Substance Use Topics   Alcohol use: No    Alcohol/week: 0.0 standard drinks of alcohol   No Known Allergies  Current Meds  Medication Sig   albuterol (VENTOLIN HFA) 108 (90 Base) MCG/ACT inhaler INHALE 2 PUFFS INTO THE LUNGS EVERY 6 HOURS AS NEEDED FOR WHEEZING OR SHORTNESS OF BREATH   Fluticasone-Umeclidin-Vilant (TRELEGY ELLIPTA) 100-62.5-25 MCG/ACT AEPB Inhale 1 puff into the lungs daily.   Immunization History  Administered Date(s) Administered   Fluad Quad(high Dose 65+) 07/08/2020, 07/03/2022   Influenza Split 05/27/2014   Influenza, High Dose Seasonal PF 06/07/2017, 06/28/2018, 06/26/2019, 08/03/2021   Influenza-Unspecified 06/25/2016   PFIZER(Purple Top)SARS-COV-2 Vaccination 10/30/2019, 11/20/2019, 05/26/2020   Pneumococcal Conjugate-13 11/08/2014   Pneumococcal Polysaccharide-23 11/19/2016   Td 11/08/2014   Tdap 05/19/2018      Objective:   Physical Exam BP 124/84 (BP Location: Left Arm, Cuff Size: Normal)   Pulse 67   Temp 97.7 F (36.5 C)   Ht 5' (1.524 m)   Wt 103 lb (46.7 kg)   SpO2 98%   BMI 20.12 kg/m  SpO2: 98 % O2 Device: None (Room air)  GENERAL: Thin, well-developed woman, no acute distress, fully ambulatory, no conversational dyspnea. HEAD: Normocephalic, atraumatic.  EYES: Pupils equal, round, reactive to light.  No scleral icterus.  MOUTH: Poor dentition, oral mucosa moist.  No thrush. NECK: Supple. No thyromegaly. Trachea midline. No JVD.  No adenopathy. PULMONARY: Good air entry bilaterally.  Coarse, otherwise no adventitious sounds. CARDIOVASCULAR: S1 and S2. Regular rate and rhythm.  No rubs, murmurs or gallops heard. ABDOMEN:  Benign. MUSCULOSKELETAL: No joint deformity, no clubbing, no edema.  NEUROLOGIC: No overt focal deficit, no gait disturbance, speech is fluent. SKIN: Intact,warm,dry. PSYCH: Mood and behavior normal.     Assessment & Plan:     ICD-10-CM   1. Stage 3 severe COPD by GOLD classification ([facility])  J44.9    Well compensated on Trelegy 100, continue Continue as needed albuterol    2. Lung nodules  R91.8    Noted on 07/2021 LDCT PET/CT 08/2021 low activity consistent with inflammation/infection Due for LDCT the 07/2022    3. Tobacco dependence due to cigarettes  F17.210    Patient counseled regards to discontinuation of smoking Time 3 to 5 minutes Patient enrolled in lung cancer screening    4. Need for immunization against influenza  Z23 Flu Vaccine QUAD High Dose(Fluad)   Patient received influenza vaccine today     Orders Placed This Encounter  Procedures   Flu Vaccine QUAD High Dose(Fluad)   Meds ordered this encounter  Medications   Fluticasone-Umeclidin-Vilant (TRELEGY ELLIPTA) 100-62.5-25 MCG/ACT AEPB    Sig: Inhale 1 puff into the lungs daily.    Dispense:  28 each    Refill:  0    Order Specific Question:   Lot Number?    Answer:   [id number]    Order Specific Question:   Expiration Date?    Answer:   11/26/2023    Order Specific Question:   Quantity    Answer:   2   Overall, Alejandra Ortiz appears to be doing well.  We again discussed the importance of smoking cessation.  Will see the patient in follow-up in 4 months time she is to contact us prior to that time should any new difficulties arise.  Gailen Shelter, MD Advanced Bronchoscopy PCCM [facility]    *This note was dictated using voice recognition software/Dragon.  Despite best efforts to proofread, errors can occur which can change the meaning. Any transcriptional errors that result from this process are unintentional and may not be fully corrected at the time of dictation.

## 2022-07-03 NOTE — Patient Instructions (Signed)
You received your flu vaccine today.  We provided you with some samples from Trelegy.  We will notify you as soon as we know the results from your scan in December.  We will see you in follow-up in 4 months time call sooner should any new problems arise.

## 2022-08-22 ENCOUNTER — Ambulatory Visit: Payer: Medicare Other

## 2022-08-22 ENCOUNTER — Ambulatory Visit
Admission: RE | Admit: 2022-08-22 | Discharge: 2022-08-22 | Disposition: A | Discharge status: Home / Self Care | Payer: Medicare Other | Source: Ambulatory Visit | Attending: Acute Care | Admitting: Acute Care

## 2022-08-22 DIAGNOSIS — Z87891 Personal history of nicotine dependence: Secondary | ICD-10-CM | POA: Diagnosis present

## 2022-08-22 DIAGNOSIS — F17210 Nicotine dependence, cigarettes, uncomplicated: Secondary | ICD-10-CM | POA: Diagnosis present

## 2022-08-24 ENCOUNTER — Telehealth: Payer: Self-pay

## 2022-08-24 NOTE — Telephone Encounter (Signed)
Copied from CRM [id number]. Topic: Medicare AWV >> Aug 13, 2022 12:02 PM Ja-Kwan M wrote: Reason for CRM: Pt would like to schedule appt for Medicare AWV    The pt was scheduled for her AWV on Tuesday, August 28, 2022

## 2022-08-28 ENCOUNTER — Ambulatory Visit (INDEPENDENT_AMBULATORY_CARE_PROVIDER_SITE_OTHER): Payer: Medicare Other

## 2022-08-28 ENCOUNTER — Telehealth: Payer: Self-pay | Admitting: Acute Care

## 2022-08-28 VITALS — BP 131/54 | HR 73 | Temp 98.0°F | Resp 18 | Ht 60.5 in | Wt 101.6 lb

## 2022-08-28 DIAGNOSIS — Z0000 Encounter for general adult medical examination without abnormal findings: Secondary | ICD-10-CM

## 2022-08-28 DIAGNOSIS — E2839 Other primary ovarian failure: Secondary | ICD-10-CM | POA: Diagnosis not present

## 2022-08-28 NOTE — Telephone Encounter (Signed)
Called and spoke to [place] at [facility] for call report CT Lung cancer:  IMPRESSION: 1. Multiple new large aggressive appearing pulmonary nodules in the lungs, as above. Although these may be of infectious or inflammatory etiology, the possibility of metastatic disease or multiple primary bronchogenic neoplasm is not excluded. Today's study is categorized as Lung-RADS 4BS, suspicious. Additional imaging evaluation or consultation with Pulmonology or Thoracic Surgery recommended. 2. The "S" modifier above refers to potentially clinically significant non lung cancer related findings. Specifically, there is aortic atherosclerosis, in addition to left main and three-vessel coronary artery disease. Please note that although the presence of coronary artery calcium documents the presence of coronary artery disease, the severity of this disease and any potential stenosis cannot be assessed on this non-gated CT examination. Assessment for potential risk factor modification, dietary therapy or pharmacologic therapy may be warranted, if clinically indicated. 3. Mild diffuse bronchial wall thickening with moderate to severe centrilobular and paraseptal emphysema; imaging findings suggestive of underlying COPD.

## 2022-08-28 NOTE — Patient Instructions (Signed)

## 2022-08-28 NOTE — Progress Notes (Signed)
Subjective:   Alejandra Ortiz is a 72 y.o. female who presents for Medicare Annual (Subsequent) preventive examination.  Review of Systems    Per HPI unless specifically indicated below.  Cardiac Risk Factors include: advanced age (>58men, >70 women);female gender, hyperlipidemia.           Objective:    Today's Vitals   08/28/22 1018 08/28/22 1027  BP: (!) 131/54   Pulse: 73   Resp: 18   Temp: 98 F (36.7 C)   TempSrc: Oral   SpO2: 98%   Weight: 101 lb 9.6 oz (46.1 kg)   Height: 5' 0.5" (1.537 m)   PainSc: 0-No pain 0-No pain   Body mass index is 19.52 kg/m.     05/19/2018    5:33 PM 10/20/2015    4:45 PM  Advanced Directives  Does Patient Have a Medical Advance Directive? No No  Would patient like information on creating a medical advance directive?  No - patient declined information    Current Medications (verified) Outpatient Encounter Medications as of 08/28/2022  Medication Sig   albuterol (VENTOLIN HFA) 108 (90 Base) MCG/ACT inhaler INHALE 2 PUFFS INTO THE LUNGS EVERY 6 HOURS AS NEEDED FOR WHEEZING OR SHORTNESS OF BREATH   Fluticasone-Umeclidin-Vilant (TRELEGY ELLIPTA) 100-62.5-25 MCG/ACT AEPB Inhale 1 puff into the lungs daily.   [DISCONTINUED] Fluticasone-Umeclidin-Vilant (TRELEGY ELLIPTA) 100-62.5-25 MCG/ACT AEPB Inhale 1 puff into the lungs daily.   Facility-Administered Encounter Medications as of 08/28/2022  Medication   albuterol (PROVENTIL) (2.5 MG/3ML) 0.083% nebulizer solution 2.5 mg    Allergies (verified) Patient has no known allergies.   History: Past Medical History:  Diagnosis Date   Chicken pox    COPD (chronic obstructive pulmonary disease) ([place])    Hyperlipidemia    Past Surgical History:  Procedure Laterality Date   TUBAL LIGATION  1978   Family History  Family history unknown: Yes   Social History   Socioeconomic History   Marital status: Married    Spouse name: Alejandra Ortiz   Number of children: 5   Years of education:  Not on file   Highest education level: Not on file  Occupational History   Occupation: Knit    Comment: [employer]  Tobacco Use   Smoking status: Every Day    Packs/day: 0.25    Years: 48.00    Total pack years: 12.00    Types: Cigarettes   Smokeless tobacco: Never   Tobacco comments:    2 cigs daily-07/03/2022    3-4 cigarettes a day   Vaping Use   Vaping Use: Never used  Substance and Sexual Activity   Alcohol use: No    Alcohol/week: 0.0 standard drinks of alcohol   Drug use: No   Sexual activity: Never    Birth control/protection: Surgical  Other Topics Concern   Not on file  Social History Narrative   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   Lives with husband and 5 children with 12 grandchildren, 4 great-grandchildren   1 dog lives inside    Associates Degree   Right hand dominant    Enjoys reading    Social Determinants of Health   Financial Resource Strain: [place]  (08/28/2022)   Overall Financial Resource Strain (CARDIA)    Difficulty of Paying Living Expenses: Not hard at all  Food Insecurity: No Food Insecurity (08/28/2022)   Hunger Vital Sign    Worried About Running Out of Food in the Last Year: Never true  Ran Out of Food in the Last Year: Never true  Transportation Needs: No Transportation Needs (08/28/2022)   PRAPARE - Administrator, Civil Service (Medical): No    Lack of Transportation (Non-Medical): No  Physical Activity: Sufficiently Active (08/28/2022)   Exercise Vital Sign    Days of Exercise per Week: 5 days    Minutes of Exercise per Session: 30 min  Stress: No Stress Concern Present (08/28/2022)   [employer] of Occupational Health - Occupational Stress Questionnaire    Feeling of Stress : Not at all  Social Connections: Moderately Isolated (08/28/2022)   Social Connection and Isolation Panel [NHANES]    Frequency of Communication with Friends and Family: Once a week    Frequency of Social Gatherings with Friends and  Family: More than three times a week    Attends Religious Services: Never    Database administrator or Organizations: No    Attends Engineer, structural: Never    Marital Status: Married    Tobacco Counseling Ready to quit: Not Answered Counseling given: Not Answered Tobacco comments: 2 cigs daily-07/03/2022 3-4 cigarettes a day    Clinical Intake:  Pre-visit preparation completed: No  Pain : No/denies pain Pain Score: 0-No pain     Nutritional Status: BMI of 19-24  Normal Nutritional Risks: None Diabetes: No  How often do you need to have someone help you when you read instructions, pamphlets, or other written materials from your doctor or pharmacy?: 1 - Never  Diabetic? No  Interpreter Needed?: No  Information entered by :: Laurel Dimmer, cMA   Activities of Daily Living    08/28/2022   10:25 AM  In your present state of health, do you have any difficulty performing the following activities:  Hearing? 0  Vision? 1  Difficulty concentrating or making decisions? 0  Walking or climbing stairs? 0  Dressing or bathing? 0  Doing errands, shopping? 0    Patient Care Team: Lorre Munroe, NP as PCP - General (Internal Medicine) Tommie Sams, DO as Consulting Physician (Family Medicine)  Indicate any recent Medical Services you may have received from other than [facility] providers in the past year (date may be approximate).   The pt was seen at [facility] on 11/0/723  for COPD.  Assessment:   This is a routine wellness examination for Alejandra Ortiz.  Hearing/Vision screen Denies any hearing issues. Worsening vision. Overdue for her Annual Eye Exam. The pt will call and schedule an appt. Wear glasses  With Correction: Right Eye 20/40, Left Eye 20/30, Both Eyes 20/30  Dietary issues and exercise activities discussed: Current Exercise Habits: The patient has a physically strenuous job, but has no regular exercise apart from work., Exercise limited by: None  identified   Goals Addressed   None    Depression Screen    08/28/2022   10:24 AM 11/17/2021    1:48 PM 08/17/2021    3:07 PM 08/17/2021    2:05 PM 11/10/2019   11:04 AM 03/16/2019   10:56 AM 11/28/2015   10:49 AM  PHQ 2/9 Scores  PHQ - 2 Score 0 0 0 0 0 0 0  PHQ- 9 Score  0 0   0     Fall Risk    08/28/2022   10:25 AM 11/17/2021    1:48 PM 08/17/2021    3:07 PM 11/10/2019   11:04 AM 03/16/2019   10:55 AM  Fall Risk   Falls in the past  year? 0 0 0 0 0  Number falls in past yr: 0 0 0  0  Injury with Fall? 0 0 0  0  Risk for fall due to : No Fall Risks No Fall Risks No Fall Risks    Follow up Falls evaluation completed Falls evaluation completed Falls evaluation completed Falls evaluation completed Falls evaluation completed    FALL RISK PREVENTION PERTAINING TO THE HOME:  Any stairs in or around the home? No  If so, are there any without handrails? No  Home free of loose throw rugs in walkways, pet beds, electrical cords, etc? Yes  Adequate lighting in your home to reduce risk of falls? Yes   ASSISTIVE DEVICES UTILIZED TO PREVENT FALLS:  Life alert? No  Use of a cane, walker or w/c? No  Grab bars in the bathroom? Yes  Shower chair or bench in shower? Yes  Elevated toilet seat or a handicapped toilet? Yes   TIMED UP AND GO:  Was the test performed? Yes .  Length of time to ambulate 10 feet: 10 sec.   Gait steady and fast without use of assistive device  Cognitive Function:        08/28/2022   10:26 AM  6CIT Screen  What Year? 0 points  What month? 0 points  What time? 0 points  Count back from 20 0 points  Months in reverse 0 points  Repeat phrase 0 points  Total Score 0 points    Immunizations Immunization History  Administered Date(s) Administered   Fluad Quad(high Dose 65+) 07/08/2020, 07/03/2022   Influenza Split 05/27/2014   Influenza, High Dose Seasonal PF 06/07/2017, 06/28/2018, 06/26/2019, 08/03/2021   Influenza-Unspecified 06/25/2016    PFIZER(Purple Top)SARS-COV-2 Vaccination 10/30/2019, 11/20/2019, 05/26/2020   Pneumococcal Conjugate-13 11/08/2014   Pneumococcal Polysaccharide-23 11/19/2016   Td 11/08/2014   Tdap 05/19/2018    TDAP status: Up to date  Flu Vaccine status: Up to date  Pneumococcal vaccine status: Up to date  Covid-19 vaccine status: Information provided on how to obtain vaccines.   Qualifies for Shingles Vaccine? Yes   Zostavax completed No   Shingrix Completed?: No.    Education has been provided regarding the importance of this vaccine. Patient has been advised to call insurance company to determine out of pocket expense if they have not yet received this vaccine. Advised may also receive vaccine at local pharmacy or Health Dept. Verbalized acceptance and understanding.  Screening Tests Health Maintenance  Topic Date Due   Zoster Vaccines- Shingrix (1 of 2) Never done   DEXA SCAN  Never done   COVID-19 Vaccine (4 - 2023-24 season) 04/27/2022   MAMMOGRAM  08/29/2023 (Originally 08/20/1999)   COLONOSCOPY (Pts 45-70yrs Insurance coverage will need to be confirmed)  08/29/2023 (Originally 08/19/1994)   Lung Cancer Screening  08/23/2023   Medicare Annual Wellness (AWV)  08/29/2023   DTaP/Tdap/Td (3 - Td or Tdap) 05/19/2028   Pneumonia Vaccine 48+ Years old  Completed   INFLUENZA VACCINE  Completed   Hepatitis C Screening  Completed   HPV VACCINES  Aged Out    Health Maintenance  Health Maintenance Due  Topic Date Due   Zoster Vaccines- Shingrix (1 of 2) Never done   DEXA SCAN  Never done   COVID-19 Vaccine (4 - 2023-24 season) 04/27/2022    Colorectal cancer: Pt declined   Mammogram: pt declined scheduling an appt  Bone Density status: Ordered 08/28/2022. Pt provided with contact info and advised to call to schedule appt.  Lung Cancer Screening: (Low Dose CT Chest recommended if Age 70-80 years, 30 pack-year currently smoking OR have quit w/in 15years.) does qualify.   Lung Cancer  Screening Referral: 08/23/2023  Additional Screening:  Hepatitis C Screening: does qualify; Completed 11/19/2016   Vision Screening: Recommended annual ophthalmology exams for early detection of glaucoma and other disorders of the eye. Is the patient up to date with their annual eye exam?  No  Who is the provider or what is the name of the office in which the patient attends annual eye exams? Overdue  If pt is not established with a provider, would they like to be referred to a provider to establish care? No .   Dental Screening: Recommended annual dental exams for proper oral hygiene  Community Resource Referral / Chronic Care Management: CRR required this visit?  No   CCM required this visit?  No      Plan:     I have personally reviewed and noted the following in the patient's chart:   Medical and social history Use of alcohol, tobacco or illicit drugs  Current medications and supplements including opioid prescriptions. Patient is not currently taking opioid prescriptions. Functional ability and status Nutritional status Physical activity Advanced directives List of other physicians Hospitalizations, surgeries, and ER visits in previous 12 months Vitals Screenings to include cognitive, depression, and falls Referrals and appointments  In addition, I have reviewed and discussed with patient certain preventive protocols, quality metrics, and best practice recommendations. A written personalized care plan for preventive services as well as general preventive health recommendations were provided to patient.     Ms. Pol , Thank you for taking time to come for your Medicare Wellness Visit. I appreciate your ongoing commitment to your health goals. Please review the following plan we discussed and let me know if I can assist you in the future.   These are the goals we discussed:  Goals      Quit Smoking        This is a list of the screening recommended for you and  due dates:  Health Maintenance  Topic Date Due   Zoster (Shingles) Vaccine (1 of 2) Never done   DEXA scan (bone density measurement)  Never done   COVID-19 Vaccine (4 - 2023-24 season) 04/27/2022   Mammogram  08/29/2023*   Colon Cancer Screening  08/29/2023*   Screening for Lung Cancer  08/23/2023   Medicare Annual Wellness Visit  08/29/2023   DTaP/Tdap/Td vaccine (3 - Td or Tdap) 05/19/2028   Pneumonia Vaccine  Completed   Flu Shot  Completed   Hepatitis C Screening: USPSTF Recommendation to screen - Ages 18-79 yo.  Completed   HPV Vaccine  Aged Out  *Topic was postponed. The date shown is not the original due date.    Wilson Singer, [place]   08/28/2022   Nurse Notes: Approximately 30 minute Face -To-Face Medicare Wellness Visit

## 2022-08-29 ENCOUNTER — Telehealth: Payer: Self-pay | Admitting: Acute Care

## 2022-08-29 DIAGNOSIS — F17210 Nicotine dependence, cigarettes, uncomplicated: Secondary | ICD-10-CM

## 2022-08-29 DIAGNOSIS — R911 Solitary pulmonary nodule: Secondary | ICD-10-CM

## 2022-08-29 NOTE — Telephone Encounter (Signed)
noted 

## 2022-08-29 NOTE — Telephone Encounter (Signed)
Agree 

## 2022-08-29 NOTE — Telephone Encounter (Signed)
LDCT results/plan faxed to PCP. Order placed for 3 month LCS nodule follow up LDCT for March 2024

## 2022-08-29 NOTE — Telephone Encounter (Signed)
I have called the patient with the results of her low-dose screening CT.  Patient had a initial abnormal screening CT 08/23/2021.  The scan was read as lung RADS 4B with notation of a right lung apex nodule measuring 10.9 mm which was suspicious but could also have been due to a progressive chronic atypical infection.  PET scan was done on September 15, 2021 which showed no suspicious findings for pulmonary neoplasm.  She did have a hypermetabolic focus in the pelvis which Dr. Patsey Berthold requested the patient follow-up with her primary care regarding.  Recommendation by radiologist was for the patient to return to the lung cancer screening program. Repeat CT done 08/22/2022 was once again read as a lung RADS 4B.  There were new large aggressive appearing pulmonary nodules in the lungs although these may be infectious or inflammatory the possibility of metastatic disease or multiple primary bronchogenic neoplasm is not excluded.  I have discussed this finding with Dr. Patsey Berthold who sees the patient on a regular basis.  Since patient has a history of waxing and waning pulmonary nodules plan is for a 78-month follow-up screening CT to reevaluate areas of concern.  We will then have patient seen by Dr. Patsey Berthold for an office visit to review the results of the repeat scan and determine best plan of care. Of note patient does not states she has been sick.  But has been at her baseline  Langley Gauss please fax results to PCP, let them know that they have been reviewed with Dr. Patsey Berthold and plan is for a 71-month follow-up in March 2024.  Please place order for 32-month low-dose screening CT follow-up scan for end of March 2024.  Patient is in agreement with this plan and understands that she will go to the office to see Dr. Patsey Berthold once the scan has been completed to review the results.  Thanks so much.

## 2022-08-30 ENCOUNTER — Ambulatory Visit (INDEPENDENT_AMBULATORY_CARE_PROVIDER_SITE_OTHER): Payer: TRICARE For Life (TFL) | Admitting: Internal Medicine

## 2022-08-30 ENCOUNTER — Encounter: Payer: Self-pay | Admitting: Internal Medicine

## 2022-08-30 VITALS — BP 126/68 | HR 67 | Temp 97.1°F | Wt 103.0 lb

## 2022-08-30 DIAGNOSIS — E7800 Pure hypercholesterolemia, unspecified: Secondary | ICD-10-CM

## 2022-08-30 DIAGNOSIS — I700 Atherosclerosis of aorta: Secondary | ICD-10-CM | POA: Diagnosis not present

## 2022-08-30 DIAGNOSIS — D508 Other iron deficiency anemias: Secondary | ICD-10-CM

## 2022-08-30 DIAGNOSIS — D649 Anemia, unspecified: Secondary | ICD-10-CM | POA: Insufficient documentation

## 2022-08-30 DIAGNOSIS — J432 Centrilobular emphysema: Secondary | ICD-10-CM

## 2022-08-30 MED ORDER — ASPIRIN 81 MG PO TBEC: 81.0000 mg | DELAYED_RELEASE_TABLET | Freq: Every day | ORAL | 12 refills | Status: DC

## 2022-08-30 NOTE — Assessment & Plan Note (Signed)
CMET and lipid profile today Encourage her to consume a low fat diet

## 2022-08-30 NOTE — Progress Notes (Signed)
Subjective:    Patient ID: Alejandra Ortiz, female    DOB: 03/12/1951, 72 y.o.   MRN: [id number]  HPI  Patient presents to clinic today for 25-month follow-up of chronic conditions.  COPD: She denies chronic cough or shortness of breath.  She is using Trelegy and Albuterol as prescribed.  There are no PFTs on file.  She does continue to smoke.  She follows with pulmonology.  HLD with Aortic Atherosclerosis: Her last LDL was 111, triglycerides 51, 07/2021.  She is not taking any cholesterol-lowering medication or Aspirin at this time.  She does not consume low-fat diet.  Anemia: Her last H/H was 11.4/35.5, 07/2021.  She is not taking any oral Iron at this time.  She does not follow with hematology.  Review of Systems     Past Medical History:  Diagnosis Date   Chicken pox    COPD (chronic obstructive pulmonary disease) ([facility])    Hyperlipidemia     Current Outpatient Medications  Medication Sig Dispense Refill   albuterol (VENTOLIN HFA) 108 (90 Base) MCG/ACT inhaler INHALE 2 PUFFS INTO THE LUNGS EVERY 6 HOURS AS NEEDED FOR WHEEZING OR SHORTNESS OF BREATH 54 g 0   Fluticasone-Umeclidin-Vilant (TRELEGY ELLIPTA) 100-62.5-25 MCG/ACT AEPB Inhale 1 puff into the lungs daily. 84 each 3   No current facility-administered medications for this visit.   Facility-Administered Medications Ordered in Other Visits  Medication Dose Route Frequency Provider Last Rate Last Admin   albuterol (PROVENTIL) (2.5 MG/3ML) 0.083% nebulizer solution 2.5 mg  2.5 mg Nebulization Once Tanda Rockers, MD        No Known Allergies  Family History  Family history unknown: Yes    Social History   Socioeconomic History   Marital status: Married    Spouse name: Jalaine Riggenbach   Number of children: 5   Years of education: Not on file   Highest education level: Not on file  Occupational History   Occupation: Knit    Comment: [employer]  Tobacco Use   Smoking status: Every Day    Packs/day:  0.25    Years: 48.00    Total pack years: 12.00    Types: Cigarettes   Smokeless tobacco: Never   Tobacco comments:    2 cigs daily-07/03/2022    3-4 cigarettes a day   Vaping Use   Vaping Use: Never used  Substance and Sexual Activity   Alcohol use: No    Alcohol/week: 0.0 standard drinks of alcohol   Drug use: No   Sexual activity: Never    Birth control/protection: Surgical  Other Topics Concern   Not on file  Social History Narrative   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   Lives with husband and 5 children with 12 grandchildren, 4 great-grandchildren   1 dog lives inside    Associates Degree   Right hand dominant    Enjoys reading    Social Determinants of Health   Financial Resource Strain: [place]  (08/28/2022)   Overall Financial Resource Strain (CARDIA)    Difficulty of Paying Living Expenses: Not hard at all  Food Insecurity: No Food Insecurity (08/28/2022)   Hunger Vital Sign    Worried About Running Out of Food in the Last Year: Never true    [place] in the Last Year: Never true  Transportation Needs: No Transportation Needs (08/28/2022)   PRAPARE - Hydrologist (Medical): No    Lack of Transportation (  Non-Medical): No  Physical Activity: Sufficiently Active (08/28/2022)   Exercise Vital Sign    Days of Exercise per Week: 5 days    Minutes of Exercise per Session: 30 min  Stress: No Stress Concern Present (08/28/2022)   [place]    Feeling of Stress : Not at all  Social Connections: Moderately Isolated (08/28/2022)   Social Connection and Isolation Panel [NHANES]    Frequency of Communication with Friends and Family: Once a week    Frequency of Social Gatherings with Friends and Family: More than three times a week    Attends Religious Services: Never    Marine scientist or Organizations: No    Attends Archivist Meetings: Never    Marital  Status: Married  Human resources officer Violence: Not At Risk (08/28/2022)   Humiliation, Afraid, Rape, and Kick questionnaire    Fear of Current or Ex-Partner: No    Emotionally Abused: No    Physically Abused: No    Sexually Abused: No     Constitutional: Denies fever, malaise, fatigue, headache or abrupt weight changes.  HEENT: Denies eye pain, eye redness, ear pain, ringing in the ears, wax buildup, runny nose, nasal congestion, bloody nose, or sore throat. Respiratory: Denies difficulty breathing, shortness of breath, cough or sputum production.   Cardiovascular: Denies chest pain, chest tightness, palpitations or swelling in the hands or feet.  Gastrointestinal: Denies abdominal pain, bloating, constipation, diarrhea or blood in the stool.  GU: Denies urgency, frequency, pain with urination, burning sensation, blood in urine, odor or discharge. Musculoskeletal: Denies decrease in range of motion, difficulty with gait, muscle pain or joint pain and swelling.  Skin: Denies redness, rashes, lesions or ulcercations.  Neurological: Denies dizziness, difficulty with memory, difficulty with speech or problems with balance and coordination.  Psych: Denies anxiety, depression, SI/HI.  No other specific complaints in a complete review of systems (except as listed in HPI above).  Objective:   Physical Exam BP 126/68 (BP Location: Right Arm, Patient Position: Sitting, Cuff Size: Normal)   Pulse 67   Temp (!) 97.1 F (36.2 C) (Temporal)   Wt 103 lb (46.7 kg)   SpO2 99%   BMI 19.78 kg/m   Wt Readings from Last 3 Encounters:  08/28/22 101 lb 9.6 oz (46.1 kg)  07/03/22 103 lb (46.7 kg)  03/29/22 109 lb 12.8 oz (49.8 kg)    General: Appears her stated age, chronically ill-appearing, in NAD. Skin: Warm, dry and intact.  HEENT: Head: normal shape and size; Eyes: sclera white, no icterus, conjunctiva pink, PERRLA and EOMs intact;  Cardiovascular: Normal rate and rhythm. S1,S2 noted.  No murmur,  rubs or gallops noted. No JVD or BLE edema. No carotid bruits noted. Pulmonary/Chest: Normal effort and coarse breath sounds. No respiratory distress. No wheezes, rales or ronchi noted.  Musculoskeletal: No difficulty with gait.  Neurological: Alert and oriented. Cranial nerves II-XII grossly intact. Coordination normal.     BMET    Component Value Date/Time   NA 139 08/17/2021 1407   NA 137 09/25/2013 0950   K 4.4 08/17/2021 1407   K 3.6 09/25/2013 0950   CL 105 08/17/2021 1407   CL 106 09/25/2013 0950   CO2 27 08/17/2021 1407   CO2 27 09/25/2013 0950   GLUCOSE 97 08/17/2021 1407   GLUCOSE 103 (H) 09/25/2013 0950   BUN 18 08/17/2021 1407   BUN 14 09/25/2013 0950   CREATININE 0.83 08/17/2021 1407  CALCIUM 9.2 08/17/2021 1407   CALCIUM 8.9 09/25/2013 0950   GFRNONAA >60 05/23/2021 1227   GFRNONAA 57 (L) 09/25/2013 0950   GFRAA >60 09/25/2013 0950    Lipid Panel     Component Value Date/Time   CHOL 206 (H) 08/17/2021 1407   TRIG 51 08/17/2021 1407   HDL 82 08/17/2021 1407   CHOLHDL 2.5 08/17/2021 1407   VLDL 12.0 03/17/2019 1401   LDLCALC 111 (H) 08/17/2021 1407    CBC    Component Value Date/Time   WBC 6.1 08/17/2021 1407   RBC 3.84 08/17/2021 1407   HGB 11.4 (L) 08/17/2021 1407   HGB 12.3 09/25/2013 0950   HCT 35.5 08/17/2021 1407   HCT 35.4 09/25/2013 0950   PLT 272 08/17/2021 1407   PLT 183 09/25/2013 0950   MCV 92.4 08/17/2021 1407   MCV 98 09/25/2013 0950   MCH 29.7 08/17/2021 1407   MCHC 32.1 08/17/2021 1407   RDW 12.7 08/17/2021 1407   RDW 12.4 09/25/2013 0950   LYMPHSABS 1.2 05/23/2021 1227   MONOABS 0.5 05/23/2021 1227   EOSABS 0.2 05/23/2021 1227   BASOSABS 0.1 05/23/2021 1227    Hgb A1C Lab Results  Component Value Date   HGBA1C 5.4 11/17/2014            Assessment & Plan:    RTC in 6 months for follow-up of chronic conditions Nicki Reaper, NP

## 2022-08-30 NOTE — Assessment & Plan Note (Signed)
Encouraged smoking cessation Continue Trelegy and Albuterol

## 2022-08-30 NOTE — Assessment & Plan Note (Signed)
CMET and lipid profile today Encouraged her to consume a low fat diet Start baby ASA

## 2022-08-30 NOTE — Patient Instructions (Signed)

## 2022-08-30 NOTE — Assessment & Plan Note (Signed)
CBC today.  

## 2022-08-31 LAB — COMPLETE METABOLIC PANEL WITH GFR
AG Ratio: 1.3 (calc) (ref 1.0–2.5)
ALT: 12 U/L (ref 6–29)
AST: 16 U/L (ref 10–35)
Albumin: 3.8 g/dL (ref 3.6–5.1)
Alkaline phosphatase (APISO): 76 U/L (ref 37–153)
BUN: 20 mg/dL (ref 7–25)
CO2: 27 mmol/L (ref 20–32)
Calcium: 9.5 mg/dL (ref 8.6–10.4)
Chloride: 107 mmol/L (ref 98–110)
Creat: 0.8 mg/dL (ref 0.60–1.00)
Globulin: 2.9 g/dL (calc) (ref 1.9–3.7)
Glucose, Bld: 102 mg/dL — ABNORMAL HIGH (ref 65–99)
Potassium: 5 mmol/L (ref 3.5–5.3)
Sodium: 141 mmol/L (ref 135–146)
Total Bilirubin: 0.3 mg/dL (ref 0.2–1.2)
Total Protein: 6.7 g/dL (ref 6.1–8.1)
eGFR: 78 mL/min/{1.73_m2} (ref >=60)

## 2022-08-31 LAB — CBC
HCT: 32.7 % — ABNORMAL LOW (ref 35.0–45.0)
Hemoglobin: 10.7 g/dL — ABNORMAL LOW (ref 11.7–15.5)
MCH: 30 pg (ref 27.0–33.0)
MCHC: 32.7 g/dL (ref 32.0–36.0)
MCV: 91.6 fL (ref 80.0–100.0)
MPV: 10.4 fL (ref 7.5–12.5)
Platelets: 306 10*3/uL (ref 140–400)
RBC: 3.57 10*6/uL — ABNORMAL LOW (ref 3.80–5.10)
RDW: 12.4 % (ref 11.0–15.0)
WBC: 6.7 10*3/uL (ref 3.8–10.8)

## 2022-08-31 LAB — LIPID PANEL
Cholesterol: 182 mg/dL (ref <200)
HDL: 73 mg/dL (ref >=50)
LDL Cholesterol (Calc): 96 mg/dL (calc)
Non-HDL Cholesterol (Calc): 109 mg/dL (calc) (ref <130)
Total CHOL/HDL Ratio: 2.5 (calc) (ref <5.0)
Triglycerides: 50 mg/dL (ref <150)

## 2022-10-08 ENCOUNTER — Encounter: Payer: Self-pay | Admitting: Pulmonary Disease

## 2022-11-10 ENCOUNTER — Encounter: Payer: Self-pay | Admitting: Pulmonary Disease

## 2022-11-21 ENCOUNTER — Ambulatory Visit: Payer: TRICARE For Life (TFL)

## 2022-11-21 ENCOUNTER — Ambulatory Visit
Admission: RE | Admit: 2022-11-21 | Discharge: 2022-11-21 | Disposition: A | Discharge status: Home / Self Care | Payer: Medicare Other | Source: Ambulatory Visit | Attending: Acute Care | Admitting: Acute Care

## 2022-11-21 DIAGNOSIS — F17210 Nicotine dependence, cigarettes, uncomplicated: Secondary | ICD-10-CM | POA: Diagnosis present

## 2022-11-21 DIAGNOSIS — R911 Solitary pulmonary nodule: Secondary | ICD-10-CM

## 2022-11-22 ENCOUNTER — Telehealth: Payer: Self-pay | Admitting: Pulmonary Disease

## 2022-11-22 ENCOUNTER — Telehealth: Payer: Self-pay | Admitting: Acute Care

## 2022-11-22 NOTE — Telephone Encounter (Signed)
Results of LDCT were reviewed by Dr. Patsey Berthold.  Rads 4B.  Dr. Patsey Berthold replied to chat message as follows: " I think we are going to have to pull her from the program because she always has these 4B's. She needs to have a follow-up appointment with me and then we can discuss how to proceed from there. I will have to continue to follow her expectantly. " OV has been scheduled with Dr. Patsey Berthold for 12/04/22.  No future orders for LDCT have been placed.

## 2022-11-22 NOTE — Telephone Encounter (Signed)
Called to give call report on patient.  CB# [phone]

## 2022-11-22 NOTE — Telephone Encounter (Signed)
Spoke to [place] with [facility] in regards to call report of CT low dose.   Sarah, please advise. Thanks

## 2022-11-23 NOTE — Telephone Encounter (Signed)
See other telephone note from 11/22/22

## 2022-12-04 ENCOUNTER — Encounter: Payer: Self-pay | Admitting: Pulmonary Disease

## 2022-12-04 ENCOUNTER — Ambulatory Visit (INDEPENDENT_AMBULATORY_CARE_PROVIDER_SITE_OTHER): Payer: Medicare Other | Admitting: Pulmonary Disease

## 2022-12-04 VITALS — BP 128/80 | HR 71 | Temp 98.0°F | Ht 60.5 in | Wt 100.8 lb

## 2022-12-04 DIAGNOSIS — J479 Bronchiectasis, uncomplicated: Secondary | ICD-10-CM

## 2022-12-04 DIAGNOSIS — F17210 Nicotine dependence, cigarettes, uncomplicated: Secondary | ICD-10-CM | POA: Diagnosis not present

## 2022-12-04 DIAGNOSIS — J449 Chronic obstructive pulmonary disease, unspecified: Secondary | ICD-10-CM

## 2022-12-04 DIAGNOSIS — R918 Other nonspecific abnormal finding of lung field: Secondary | ICD-10-CM | POA: Diagnosis not present

## 2022-12-04 MED ORDER — TRELEGY ELLIPTA 200-62.5-25 MCG/ACT IN AEPB: 1.0000 | INHALATION_SPRAY | Freq: Every day | RESPIRATORY_TRACT | 0 refills | Status: DC

## 2022-12-04 NOTE — Progress Notes (Signed)
Subjective:    Patient ID: Alejandra Ortiz, female    DOB: [birth year], 72 y.o.   MRN: [id number] Patient Care Team: Lorre Munroe, NP as PCP - General (Internal Medicine) Tommie Sams, DO as Consulting Physician [facility])  Chief Complaint  Patient presents with   Follow-up    SOB with exertion. No wheezing. Cough with sputum in the mornings. Other times it is just a dry cough.    HPI The patient is a 72 year old current smoker 144-pack-year history of smoking and with significant pulmonary emphysema, who presents for follow-up on COPD and abnormal lung cancer screening scans.  She is currently maintained with Trelegy Ellipta 100, 1 puff daily and as needed albuterol.  She notes doing well with this medication.  She hardly ever has to use her rescue albuterol inhaler.  She was last evaluated here on 03 July 2022, this is a scheduled visit.  Patient has been having waxing and waning nodules noted during lung cancer screening.  He has had prior PET/CT on 14 September 2021 that failed to show any findings suspicious for pulmonary neoplasm.  Her most recent low-dose CT chest was performed 21 November 2022.  This was read as a lung RADS 4B, suspicious.  Has been an interval progression of 2 of the more suspicious nodules on the previous study including superior segment of the right lower lobe lesion measuring now 18 mm compared to 11.7 previously.  There is a left lower lobe nodule measuring 20.2 mm today compared to 7 mm previously.  There is a new 7 mm left upper lobe nodule on the last study.  There is also extensive pleural-parenchymal scarring in both lung apices.  The patient remains asymptomatic with regards to the symptoms.  She has baseline short shortness of breath on exertion which is unchanged in character.  Does not report any wheezing.  She has cough productive of whitish sputum's in the morning also unchanged in character.  No hemoptysis.  She does have dry cough during the day but  this is also unchanged.  She has decreased her cigarette use down to 8 cigarettes/day.  He does not endorse any weight loss or anorexia, lower extremity edema or calf tenderness.  She does not endorse any other symptomatology.   The patient presents today to the visit with her daughter.  I reviewed the images in question independently and reviewed the images with the patient and her daughter.  All questions were answered to their satisfaction.  The patient is currently maintained on Trelegy Ellipta and as needed albuterol.  She is compliant with these medications.  She uses albuterol 1-2 times a day.  She feels Trelegy helps with her symptoms.   Review of Systems A 10 point review of systems was performed and it is as noted above otherwise negative.  Patient Active Problem List   Diagnosis Date Noted   Absolute anemia 08/30/2022   Aortic atherosclerosis 08/17/2021   Hyperlipidemia 02/09/2016   COPD (chronic obstructive pulmonary disease) with emphysema 02/07/2016   Social History   Tobacco Use   Smoking status: Every Day    Packs/day: 0.25    Years: 48.00    Additional pack years: 0.00    Total pack years: 12.00    Types: Cigarettes   Smokeless tobacco: Never   Tobacco comments:    8 cigarettes daily- 12/04/2022 khj  Substance Use Topics   Alcohol use: No    Alcohol/week: 0.0 standard drinks of alcohol   No  Known Allergies Current Meds  Medication Sig   albuterol (VENTOLIN HFA) 108 (90 Base) MCG/ACT inhaler INHALE 2 PUFFS INTO THE LUNGS EVERY 6 HOURS AS NEEDED FOR WHEEZING OR SHORTNESS OF BREATH   aspirin EC 81 MG tablet Take 1 tablet (81 mg total) by mouth daily. Swallow whole.   Fluticasone-Umeclidin-Vilant (TRELEGY ELLIPTA) 100-62.5-25 MCG/ACT AEPB Inhale 1 puff into the lungs daily.   Immunization History  Administered Date(s) Administered   Fluad Quad(high Dose 65+) 07/08/2020, 07/03/2022   Influenza Split 05/27/2014   Influenza, High Dose Seasonal PF 06/07/2017,  06/28/2018, 06/26/2019, 08/03/2021   Influenza-Unspecified 06/25/2016   PFIZER(Purple Top)SARS-COV-2 Vaccination 10/30/2019, 11/20/2019, 05/26/2020   Pneumococcal Conjugate-13 11/08/2014   Pneumococcal Polysaccharide-23 11/19/2016   Td 11/08/2014   Tdap 05/19/2018       Objective:   Physical Exam BP 128/80 (BP Location: Left Arm, Cuff Size: Normal)   Pulse 71   Temp 98 F (36.7 C)   Ht 5' 0.5" (1.537 m)   Wt 100 lb 12.8 oz (45.7 kg)   SpO2 97%   BMI 19.36 kg/m   SpO2: 97 % O2 Device: None (Room air)  GENERAL: Thin well-developed woman, no acute distress, fully ambulatory, no conversational dyspnea. HEAD: Normocephalic, atraumatic.  EYES: Pupils equal, round, reactive to light.  No scleral icterus.  MOUTH: Poor dentition, oral mucosa moist.  No thrush. NECK: Supple. No thyromegaly. Trachea midline. No JVD.  No adenopathy. PULMONARY: Good air entry bilaterally.  Coarse, rhonchi throughout, no wheezes. CARDIOVASCULAR: S1 and S2. Regular rate and rhythm.  No rubs, murmurs or gallops heard. ABDOMEN: Benign. MUSCULOSKELETAL: No joint deformity, no clubbing, no edema.  NEUROLOGIC: No overt focal deficit, no gait disturbance, speech is fluent. SKIN: Intact,warm,dry. PSYCH: Mood and behavior normal.   Representative images from CT low-dose performed 21 November 2022:  Pleural-parenchymal scarring upper lobes (arrows)   Nodule with cavitary center on the right now 18 mm, previously 11.7 mm   Nodule in the left lower lobe previously 7 mm now 20.2 mm      Assessment & Plan:     ICD-10-CM   1. Stage 3 severe COPD by GOLD classification  J44.9    Continue Trelegy  Continue as needed albuterol STOP SMOKING    2. Bronchiectasis without complication  J47.9 Sputum induction     MYCOBACTERIA, CULTURE, WITH FLUOROCHROME SMEAR    Respiratory or Resp and Sputum Culture   Sputum induction Sputum for AFB and routine culture Query MAC    3. Multiple lung nodules on CT  R91.8  Sputum induction     MYCOBACTERIA, CULTURE, WITH FLUOROCHROME SMEAR    Respiratory or Resp and Sputum Culture   Sputum culture as above If no actionable organisms proceed with PET/CT If actionable organisms proceed with therapy then repeat CT    4. Tobacco dependence due to cigarettes  F17.210    Patient counseled with regards to discontinuation of smoking Total counseling time 3 to 5 minutes     Orders Placed This Encounter  Procedures    MYCOBACTERIA, CULTURE, WITH FLUOROCHROME SMEAR    Standing Status:   Future    Standing Expiration Date:   06/05/2023   Respiratory or Resp and Sputum Culture    Standing Status:   Future    Standing Expiration Date:   12/04/2023   Sputum induction    Standing Status:   Future    Standing Expiration Date:   12/04/2023    Order Specific Question:   Where should this test  be performed?    Answer:   [facility]   Meds ordered this encounter  Medications   Fluticasone-Umeclidin-Vilant (TRELEGY ELLIPTA) 200-62.5-25 MCG/ACT AEPB    Sig: Inhale 1 puff into the lungs daily.    Dispense:  28 each    Refill:  0    Order Specific Question:   Lot Number?    Answer:   [id number]    Order Specific Question:   Expiration Date?    Answer:   01/26/2024    Order Specific Question:   Quantity    Answer:   1   Will see the patient in follow-up in 6 to 8 weeks time call sooner should any new problems arise.  Gailen Shelter, MD Advanced Bronchoscopy PCC[facility]    *This note was dictated using voice recognition software/Dragon.  Despite best efforts to proofread, errors can occur which can change the meaning. Any transcriptional errors that result from this process are unintentional and may not be fully corrected at the time of dictation.

## 2022-12-04 NOTE — Patient Instructions (Addendum)
We are going to collect a sputum specimen this will help [place] see what is growing and provide you with the treatment.  We will repeat a chest CT once that treatment is completed.  A 14-day supply of the stronger version of Trelegy, let us know if this works better for you.  Do not use the regular Trelegy while using the stronger version.  We will see you back in 6 to 8 weeks time call sooner should any new problems arise.

## 2022-12-26 ENCOUNTER — Telehealth: Payer: Self-pay | Admitting: Pulmonary Disease

## 2022-12-26 MED ORDER — TRELEGY ELLIPTA 200-62.5-25 MCG/ACT IN AEPB: 1.0000 | INHALATION_SPRAY | Freq: Every day | RESPIRATORY_TRACT | 3 refills | Status: DC

## 2022-12-26 NOTE — Telephone Encounter (Signed)
I spoke with the patient. She would like the Telegy 200mg  sent to her pharmacy.  I have sent in the prescription and the patient is aware.  Nothing further needed.

## 2022-12-26 NOTE — Telephone Encounter (Signed)
Pt like how the trelegy helped her and now she was a prescription for it Pharmacy: [facility]

## 2023-01-17 ENCOUNTER — Ambulatory Visit: Payer: Medicare Other | Admitting: Adult Health

## 2023-01-17 ENCOUNTER — Encounter: Payer: Self-pay | Admitting: Adult Health

## 2023-01-17 VITALS — BP 124/74 | HR 70 | Temp 97.8°F | Ht 60.5 in | Wt 93.8 lb

## 2023-01-17 DIAGNOSIS — R918 Other nonspecific abnormal finding of lung field: Secondary | ICD-10-CM

## 2023-01-17 DIAGNOSIS — J432 Centrilobular emphysema: Secondary | ICD-10-CM | POA: Diagnosis not present

## 2023-01-17 DIAGNOSIS — C3432 Malignant neoplasm of lower lobe, left bronchus or lung: Secondary | ICD-10-CM | POA: Insufficient documentation

## 2023-01-17 MED ORDER — ALBUTEROL SULFATE (2.5 MG/3ML) 0.083% IN NEBU: 2.5000 mg | INHALATION_SOLUTION | Freq: Four times a day (QID) | RESPIRATORY_TRACT | 5 refills | Status: DC | PRN

## 2023-01-17 MED ORDER — ALBUTEROL SULFATE (2.5 MG/3ML) 0.083% IN NEBU
2.5000 mg | INHALATION_SOLUTION | Freq: Once | RESPIRATORY_TRACT | Status: AC
Start: 2023-01-17 — End: 2023-01-17
  Administered 2023-01-17: 2.5 mg via RESPIRATORY_TRACT

## 2023-01-17 NOTE — Progress Notes (Signed)
@Patient  ID: Alejandra Ortiz, female    DOB: 07/27/1951, 72 y.o.   MRN: [id number]  Chief Complaint  Patient presents with   Follow-up    Referring provider: Lorre Munroe, NP  HPI: 72 year old female active smoker with heavy smoking history followed for COPD with emphysema and abnormal CT chest with lung nodules Participates in the lung cancer CT chest screening program  TEST/EVENTS :  PFTs done June 06, 2021 showed moderate to severe COPD with FEV1 at 53%, ratio 52, FVC 78%, no significant bronchodilator response, DLCO 58%  CT chest August 22, 2021 showed new irregular solid pulmonary nodules largest measuring 10.9 cm in the right lung apex, lung RADS 4B suspicious  PET scan September 14, 2021 showed no suspicious findings, severe emphysematous changes, stable biapical pleural and parenchymal scarring changes  CT chest August 22, 2022 showed multiple new pulmonary nodules including some cavitary nodules in the lower lobes, stable bilateral apical pleural-parenchymal thickening/scarring and emphysema  CT chest November 21, 2022 showed progressive consolidation in the right apex, numerous bilateral pulmonary nodules are again identified, some of the nodules are stable with the 1 decreasing in size.  Increased suspicious nodules in the right lower lobe measuring 7 mm previously now 20.2 mm and 18 mm today measuring 11.7 mm previously.  New 7 mm left upper lobe nodule  01/17/2023 Follow up ; COPD, and Lung nodules  Patient presents for a 6-week follow-up.  Patient has underlying severe COPD with emphysema.  She remains on Trelegy inhaler daily.  Says overall breathing is doing about the same.  Typically has a productive cough first thing in the morning.  Has thick mucus that typically clears as the day goes on.  Patient does continue to smoke.  We discussed smoking cessation. Patient denies any hemoptysis.  Has had gradual weight loss over the last year approximately 10 to 12 pounds.   Says that she has some appetite but just does not eat as much as she used to. Patient room is active.  Works full-time.  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 Says she occasionally has some trouble swallowing but no severe choking.  No significant acid reflux.  Patient has had some waxing and waning pulmonary nodules noted on CT scan in the past.  As above CT chest in December 2022 showed some new solid pulmonary nodules.  Subsequent PET scan September 14, 2021 showed no suspicious finding for neoplasm.  Patient participates in the lung cancer CT screening program.  Routine CT chest in August 22, 2022 showed multiple new pulmonary nodules with some cavitary nodules in the lower lobes.  Serial CT chest follow-up on November 21, 2022 showed progressive consolidation in the right apex with bilateral pulmonary nodules also some new nodules in the left upper lobe and increased size in the right lower lobe nodules.  Patient was recommended for a sputum culture and sputum AFB unfortunately this has not come and completed.  We tried to get a sputum culture and sputum AFB today but patient was unable says that she will try to bring it back tomorrow.     No Known Allergies  Immunization History  Administered Date(s) Administered   Fluad Quad(high Dose 65+) 07/08/2020, 07/03/2022   Influenza Split 05/27/2014   Influenza, High Dose Seasonal PF 06/07/2017, 06/28/2018, 06/26/2019, 08/03/2021   Influenza-Unspecified 06/25/2016   PFIZER(Purple Top)SARS-COV-2 Vaccination 10/30/2019, 11/20/2019, 05/26/2020   Pneumococcal Conjugate-13 11/08/2014   Pneumococcal Polysaccharide-23 11/19/2016   Td 11/08/2014   Tdap 05/19/2018  Past Medical History:  Diagnosis Date   Chicken pox    COPD (chronic obstructive pulmonary disease) (HCC)    Hyperlipidemia     Tobacco History: Social History   Tobacco Use  Smoking Status Every Day   Packs/day: 0.25   Years: 48.00   Additional pack years: 0.00   Total  pack years: 12.00   Types: Cigarettes  Smokeless Tobacco Never  Tobacco Comments   4 cigarettes daily- 01/17/2023 khj   Ready to quit: No Counseling given: Yes Tobacco comments: 4 cigarettes daily- 01/17/2023 khj   Outpatient Medications Prior to Visit  Medication Sig Dispense Refill   albuterol (VENTOLIN HFA) 108 (90 Base) MCG/ACT inhaler INHALE 2 PUFFS INTO THE LUNGS EVERY 6 HOURS AS NEEDED FOR WHEEZING OR SHORTNESS OF BREATH 54 g 0   aspirin EC 81 MG tablet Take 1 tablet (81 mg total) by mouth daily. Swallow whole. 30 tablet 12   Fluticasone-Umeclidin-Vilant (TRELEGY ELLIPTA) 200-62.5-25 MCG/ACT AEPB Inhale 1 puff into the lungs daily. 180 each 3   No facility-administered medications prior to visit.     Review of Systems:   Constitutional:   No   night sweats,  Fevers, chills, + fatigue, or  lassitude.  HEENT:   No headaches,  Difficulty swallowing,  Tooth/dental problems, or  Sore throat,                No sneezing, itching, ear ache, nasal congestion, post nasal drip,   CV:  No chest pain,  Orthopnea, PND, swelling in lower extremities, anasarca, dizziness, palpitations, syncope.   GI  No heartburn, indigestion, abdominal pain, nausea, vomiting, diarrhea, change in bowel habits, loss of appetite, bloody stools.   Resp:   No chest wall deformity  Skin: no rash or lesions.  GU: no dysuria, change in color of urine, no urgency or frequency.  No flank pain, no hematuria   MS:  No joint pain or swelling.  No decreased range of motion.  No back pain.    Physical Exam  BP 124/74 (BP Location: Left Arm, Cuff Size: Normal)   Pulse 70   Temp 97.8 F (36.6 C)   Ht 5' 0.5" (1.537 m)   Wt 93 lb 12.8 oz (42.5 kg)   SpO2 98%   BMI 18.02 kg/m   GEN: A/Ox3; pleasant , NAD, thin, elderly    HEENT:  [place]/AT,  EACs-clear, TMs-wnl, NOSE-clear, THROAT-clear, no lesions, no postnasal drip or exudate noted.   NECK:  Supple w/ fair ROM; no JVD; normal carotid impulses w/o bruits;  no thyromegaly or nodules palpated; no lymphadenopathy.    RESP  few scattered rhonchi no accessory muscle use, no dullness to percussion  CARD:  RRR, no m/r/g, no peripheral edema, pulses intact, no cyanosis or clubbing.  GI:   Soft & nt; nml bowel sounds; no organomegaly or masses detected.   Musco: Warm bil, no deformities or joint swelling noted.   Neuro: alert, no focal deficits noted.    Skin: Warm, no lesions or rashes     BNP No results found for: "BNP"  ProBNP No results found for: "PROBNP"  Imaging: No results found.        No data to display          No results found for: "NITRICOXIDE"      Assessment & Plan:   COPD (chronic obstructive pulmonary disease) with emphysema (HCC) Moderate to be her COPD-encouraged on smoking cessation.  Continue on triple therapy maintenance regimen Add albuterol  neb for an albuterol nebulizer for mucociliary clearance  Plan  Patient Instructions  Continue on Trelegy 1 puff daily, rinse after use.  Work on not smoking  Add flutter valve Twice daily   Albuterol inhaler As needed   Add Albuterol neb every 6hr as needed .  Aspiration precautions, remain upright for at least 2hr after eating.  Sputum culture and Sputum AFB Set up for PET scan in ~2 weeks  Follow up with Dr. Jayme Cloud in 4-6 weeks and As needed     .  Lung nodules Scattered pulmonary nodules-history of waxing and waning nodules in the past.  Previous PET scan in January 2023 was not suspicious.  However most recent CT scan shows progressive nodularity/consolidation.  Patient is high risk with history of smoking.  Also has associated weight loss.  Will check sputum AFB and culture.  Set up PET scan for around 2 weeks.  Patient has no acute infectious symptoms hold on antibiotics until cultures return. Albuterol nebulizer treatment was given in the office but patient was unable to give sputum culture currently.  She says she will return with culture  tomorrow typically get out a good sputum sample first thing in the morning in the morning.  Plan  Patient Instructions  Continue on Trelegy 1 puff daily, rinse after use.  Work on not smoking  Add flutter valve Twice daily   Albuterol inhaler As needed   Add Albuterol neb every 6hr as needed .  Aspiration precautions, remain upright for at least 2hr after eating.  Sputum culture and Sputum AFB Set up for PET scan in ~2 weeks  Follow up with Dr. Jayme Cloud in 4-6 weeks and As needed       I spent  41  minutes dedicated to the care of this patient on the date of this encounter to include pre-visit review of records, face-to-face time with the patient discussing conditions above, post visit ordering of testing, clinical documentation with the electronic health record, making appropriate referrals as documented, and communicating necessary findings to members of the patients care team.    Rubye Oaks, NP 01/17/2023

## 2023-01-17 NOTE — Assessment & Plan Note (Signed)
Moderate to be her COPD-encouraged on smoking cessation.  Continue on triple therapy maintenance regimen Add albuterol neb for an albuterol nebulizer for mucociliary clearance  Plan  Patient Instructions  Continue on Trelegy 1 puff daily, rinse after use.  Work on not smoking  Add flutter valve Twice daily   Albuterol inhaler As needed   Add Albuterol neb every 6hr as needed .  Aspiration precautions, remain upright for at least 2hr after eating.  Sputum culture and Sputum AFB Set up for PET scan in ~2 weeks  Follow up with Dr. Jayme Cloud in 4-6 weeks and As needed     .

## 2023-01-17 NOTE — Progress Notes (Signed)
Agree with the details of the visit as noted by Tammy Parrett, NP.  C. Laura Lyrica Mcclarty, MD [facility] 

## 2023-01-17 NOTE — Addendum Note (Signed)
Addended by: Lajoyce Lauber A on: 01/17/2023 12:21 PM   Modules accepted: Orders

## 2023-01-17 NOTE — Assessment & Plan Note (Addendum)
Scattered pulmonary nodules-history of waxing and waning nodules in the past.  Previous PET scan in January 2023 was not suspicious.  However most recent CT scan shows progressive nodularity/consolidation.  Patient is high risk with history of smoking.  Also has associated weight loss.  Will check sputum AFB and culture.  Set up PET scan for around 2 weeks.  Patient has no acute infectious symptoms hold on antibiotics until cultures return. Albuterol nebulizer treatment was given in the office but patient was unable to give sputum culture currently.  She says she will return with culture tomorrow typically get out a good sputum sample first thing in the morning in the morning.  Plan  Patient Instructions  Continue on Trelegy 1 puff daily, rinse after use.  Work on not smoking  Add flutter valve Twice daily   Albuterol inhaler As needed   Add Albuterol neb every 6hr as needed .  Aspiration precautions, remain upright for at least 2hr after eating.  Sputum culture and Sputum AFB Set up for PET scan in ~2 weeks  Follow up with Dr. Jayme Cloud in 4-6 weeks and As needed

## 2023-01-17 NOTE — Patient Instructions (Addendum)
Continue on Trelegy 1 puff daily, rinse after use.  Work on not smoking  Add flutter valve Twice daily   Albuterol inhaler As needed   Add Albuterol neb every 6hr as needed .  Aspiration precautions, remain upright for at least 2hr after eating.  Sputum culture and Sputum AFB Set up for PET scan in ~2 weeks  Follow up with Dr. Jayme Cloud in 4-6 weeks and As needed

## 2023-01-18 ENCOUNTER — Other Ambulatory Visit
Admission: RE | Admit: 2023-01-18 | Discharge: 2023-01-18 | Disposition: A | Discharge status: Home / Self Care | Payer: Medicare Other | Source: Ambulatory Visit | Attending: Pulmonary Disease | Admitting: Pulmonary Disease

## 2023-01-18 ENCOUNTER — Encounter: Payer: Self-pay | Admitting: Pulmonary Disease

## 2023-01-18 DIAGNOSIS — R918 Other nonspecific abnormal finding of lung field: Secondary | ICD-10-CM | POA: Insufficient documentation

## 2023-01-18 DIAGNOSIS — J479 Bronchiectasis, uncomplicated: Secondary | ICD-10-CM | POA: Insufficient documentation

## 2023-01-18 LAB — CULTURE, RESPIRATORY W GRAM STAIN

## 2023-01-18 LAB — EXPECTORATED SPUTUM ASSESSMENT W GRAM STAIN, RFLX TO RESP C

## 2023-01-18 NOTE — Addendum Note (Signed)
Addended by: Erling Conte on: 01/18/2023 01:12 PM   Modules accepted: Orders

## 2023-01-19 LAB — CULTURE, RESPIRATORY W GRAM STAIN

## 2023-01-20 LAB — CULTURE, RESPIRATORY W GRAM STAIN: Culture: NORMAL

## 2023-01-22 LAB — ACID FAST SMEAR (AFB, MYCOBACTERIA): Acid Fast Smear: POSITIVE — AB

## 2023-01-29 LAB — ACID FAST ID BY PCR AND SUSCEPTIBILITIES: M avium complex: POSITIVE — AB

## 2023-01-30 ENCOUNTER — Other Ambulatory Visit: Payer: Self-pay

## 2023-01-30 DIAGNOSIS — A319 Mycobacterial infection, unspecified: Secondary | ICD-10-CM

## 2023-01-31 ENCOUNTER — Other Ambulatory Visit
Admission: RE | Admit: 2023-01-31 | Discharge: 2023-01-31 | Disposition: A | Discharge status: Home / Self Care | Payer: Medicare Other | Attending: Infectious Diseases | Admitting: Infectious Diseases

## 2023-01-31 ENCOUNTER — Ambulatory Visit: Payer: Medicare Other | Attending: Infectious Diseases | Admitting: Infectious Diseases

## 2023-01-31 ENCOUNTER — Encounter: Payer: Self-pay | Admitting: Infectious Diseases

## 2023-01-31 VITALS — BP 135/79 | HR 74 | Temp 98.4°F | Wt 93.0 lb

## 2023-01-31 DIAGNOSIS — R059 Cough, unspecified: Secondary | ICD-10-CM | POA: Insufficient documentation

## 2023-01-31 DIAGNOSIS — A310 Pulmonary mycobacterial infection: Secondary | ICD-10-CM | POA: Insufficient documentation

## 2023-01-31 DIAGNOSIS — F17210 Nicotine dependence, cigarettes, uncomplicated: Secondary | ICD-10-CM | POA: Insufficient documentation

## 2023-01-31 DIAGNOSIS — Z114 Encounter for screening for human immunodeficiency virus [HIV]: Secondary | ICD-10-CM

## 2023-01-31 DIAGNOSIS — J439 Emphysema, unspecified: Secondary | ICD-10-CM | POA: Diagnosis not present

## 2023-01-31 LAB — CBC WITH DIFFERENTIAL/PLATELET
Abs Immature Granulocytes: 0.04 10*3/uL (ref 0.00–0.07)
Basophils Absolute: 0.1 10*3/uL (ref 0.0–0.1)
Basophils Relative: 1 %
Eosinophils Absolute: 0 10*3/uL (ref 0.0–0.5)
Eosinophils Relative: 0 %
HCT: 34.2 % — ABNORMAL LOW (ref 36.0–46.0)
Hemoglobin: 10.5 g/dL — ABNORMAL LOW (ref 12.0–15.0)
Immature Granulocytes: 0 %
Lymphocytes Relative: 11 %
Lymphs Abs: 1 10*3/uL (ref 0.7–4.0)
MCH: 27.7 pg (ref 26.0–34.0)
MCHC: 30.7 g/dL (ref 30.0–36.0)
MCV: 90.2 fL (ref 80.0–100.0)
Monocytes Absolute: 0.5 10*3/uL (ref 0.1–1.0)
Monocytes Relative: 5 %
Neutro Abs: 7.6 10*3/uL (ref 1.7–7.7)
Neutrophils Relative %: 83 %
Platelets: 349 10*3/uL (ref 150–400)
RBC: 3.79 MIL/uL — ABNORMAL LOW (ref 3.87–5.11)
RDW: 14.3 % (ref 11.5–15.5)
WBC: 9.2 10*3/uL (ref 4.0–10.5)
nRBC: 0 % (ref 0.0–0.2)

## 2023-01-31 LAB — COMPREHENSIVE METABOLIC PANEL
ALT: 14 U/L (ref 0–44)
AST: 22 U/L (ref 15–41)
Albumin: 3.3 g/dL — ABNORMAL LOW (ref 3.5–5.0)
Alkaline Phosphatase: 81 U/L (ref 38–126)
Anion gap: 9 (ref 5–15)
BUN: 14 mg/dL (ref 8–23)
CO2: 25 mmol/L (ref 22–32)
Calcium: 9.1 mg/dL (ref 8.9–10.3)
Chloride: 103 mmol/L (ref 98–111)
Creatinine, Ser: 0.84 mg/dL (ref 0.44–1.00)
GFR, Estimated: >60 mL/min (ref >60)
Glucose, Bld: 108 mg/dL — ABNORMAL HIGH (ref 70–99)
Potassium: 4 mmol/L (ref 3.5–5.1)
Sodium: 137 mmol/L (ref 135–145)
Total Bilirubin: 0.4 mg/dL (ref 0.3–1.2)
Total Protein: 7.5 g/dL (ref 6.5–8.1)

## 2023-01-31 LAB — HEPATITIS B SURFACE ANTIGEN: Hepatitis B Surface Ag: NONREACTIVE

## 2023-01-31 LAB — HEPATITIS B CORE ANTIBODY, TOTAL: Hep B Core Total Ab: NONREACTIVE

## 2023-01-31 LAB — HEPATITIS C ANTIBODY: HCV Ab: NONREACTIVE

## 2023-01-31 NOTE — Patient Instructions (Signed)
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 We need to test 2 more sputum Need to do some labs Also you need to see the eye doctor as one of the medicine that we give for treatment called ethambutol can impair color vision You may also want to get your hearing tested to have a baseline The three antibiotics which would be given for are Azithromycin 250mg  every day Ethambutol 600mg  every day Rifampin 600mg  every day Will start once we have the above labs and the tests done

## 2023-01-31 NOTE — Progress Notes (Signed)
NAME: Alejandra Ortiz  DOB: 16-Jan-1951  MRN: [id number]  Date/Time: 01/31/2023 10:28 AM  Subjective:   ? MICKELA HOLDERBAUM is a 72 y.o. with a history of COPD/emphysema was having routine cancer screeening CT and it was showing progression of nodules and then a cavitary lesion. 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 Lives with husband - has a few dogs   Past Medical History:  Diagnosis Date   Chicken pox    COPD (chronic obstructive pulmonary disease) (HCC)    Hyperlipidemia     Past Surgical History:  Procedure Laterality Date   TUBAL LIGATION  1978    Social History   Socioeconomic History   Marital status: Married    Spouse name: Aureliana Wassmann   Number of children: 5   Years of education: Not on file   Highest education level: Not on file  Occupational History   Occupation: Knit    Comment: [employer]  Tobacco Use   Smoking status: Every Day    Packs/day: 0.25    Years: 48.00    Additional pack years: 0.00    Total pack years: 12.00    Types: Cigarettes   Smokeless tobacco: Never   Tobacco comments:    4 cigarettes daily- 01/17/2023 khj  Vaping Use   Vaping Use: Never used  Substance and Sexual Activity   Alcohol use: No    Alcohol/week: 0.0 standard drinks of alcohol   Drug use: No   Sexual activity: Never    Birth control/protection: Surgical  Other Topics Concern   Not on file  Social History Narrative   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   Lives with husband and 5 children with 12 grandchildren, 4 great-grandchildren   1 dog lives inside    Associates Degree   Right hand dominant    Enjoys reading    Social Determinants of Health   Financial Resource Strain: Low Risk  (08/28/2022)   Overall Financial  Resource Strain (CARDIA)    Difficulty of Paying Living Expenses: Not hard at all  Food Insecurity: No Food Insecurity (08/28/2022)   Hunger Vital Sign    Worried About Running Out of Food in the Last Year: Never true    Ran Out of Food in the Last Year: Never true  Transportation Needs: No Transportation Needs (08/28/2022)   PRAPARE - Administrator, Civil Service (Medical): No    Lack of Transportation (Non-Medical): No  Physical Activity: Sufficiently Active (08/28/2022)   Exercise Vital Sign    Days of Exercise per Week: 5 days    Minutes of Exercise per Session: 30 min  Stress: No Stress Concern Present (08/28/2022)   [employer] of Occupational Health - Occupational Stress Questionnaire    Feeling of Stress : Not at all  Social Connections: Moderately Isolated (08/28/2022)   Social Connection and Isolation Panel [NHANES]    Frequency of Communication with Friends and Family: Once a week    Frequency of Social Gatherings with Friends and Family: More than three times a week    Attends Religious Services: Never    Database administrator or Organizations: No    Attends Banker Meetings: Never    Marital Status: Married  Intimate Partner Violence: Not At Risk (08/28/2022)   Humiliation, Afraid, Rape, and Kick questionnaire    Fear of Current or Ex-Partner: No    Emotionally Abused: No    Physically Abused: No    Sexually Abused: No    Family History  Family history unknown: Yes   No Known Allergies I? Current Outpatient Medications  Medication Sig Dispense Refill   albuterol (PROVENTIL) (2.5 MG/3ML) 0.083% nebulizer solution Take 3 mLs (2.5 mg total) by nebulization every 6 (six) hours as needed for wheezing or shortness of breath. 75 mL 5   albuterol (VENTOLIN HFA) 108 (90 Base) MCG/ACT inhaler INHALE 2 PUFFS INTO THE LUNGS EVERY 6 HOURS AS NEEDED FOR WHEEZING OR SHORTNESS OF BREATH 54 g 0   aspirin EC 81 MG tablet Take 1 tablet (81 mg total) by mouth  daily. Swallow whole. 30 tablet 12   Fluticasone-Umeclidin-Vilant (TRELEGY ELLIPTA) 200-62.5-25 MCG/ACT AEPB Inhale 1 puff into the lungs daily. 180 each 3   No current facility-administered medications for this visit.     Abtx:  Anti-infectives (From admission, onward)    None       REVIEW OF SYSTEMS:  Const: negative fever, negative chills, negative weight loss Eyes: negative diplopia or visual changes, negative eye pain ENT: negative coryza, negative sore throat Resp:  cough,  green sputum dyspnea Cards: negative for chest pain, palpitations, lower extremity edema GU: negative for frequency, dysuria and hematuria GI: Negative for abdominal pain, diarrhea, bleeding, constipation Skin: negative for rash and pruritus Heme: negative for easy bruising and gum/nose bleeding MS: negative for myalgias, arthralgias, back pain and muscle weakness Neurolo:negative for headaches, dizziness, vertigo, memory problems  Psych: negative for feelings of anxiety, depression  Endocrine: negative for thyroid, diabetes Allergy/Immunology- negative for any medication or food allergies ? Objective:  VITALS:  BP 135/79   Pulse 74   Temp 98.4 F (36.9 C) (Oral)   Wt 93 lb (42.2 kg) Comment: pt reports  SpO2 96%   BMI 17.86 kg/m   PHYSICAL EXAM:  General: Alert, cooperative, no distress, thin built, frail Head: Normocephalic, without obvious abnormality, atraumatic. Eyes: Conjunctivae clear, anicteric sclerae. Pupils are equal ENT Nares normal. No drainage or sinus tenderness. Lips, mucosa, and tongue normal. No Thrush dentures Neck: Supple, symmetrical, no adenopathy, thyroid: non tender no carotid bruit and no JVD. Back: No CVA tenderness. Lungs: b/l air entry crepts present Heart: Regular rate and rhythm, no murmur, rub or gallop. Abdomen: Soft, non-tender,not distended. Bowel sounds normal. No masses Extremities: atraumatic, no cyanosis. No edema. No clubbing Skin: No rashes or  lesions. Or bruising Lymph: Cervical, supraclavicular normal. Neurologic: Grossly non-focal Pertinent Labs Lab Results CBC    Component Value Date/Time   WBC 6.7 08/30/2022 1103   RBC 3.57 (L) 08/30/2022 1103   HGB 10.7 (L) 08/30/2022 1103   HGB 12.3 09/25/2013 0950   HCT 32.7 (L) 08/30/2022 1103   HCT 35.4 09/25/2013 0950   PLT 306 08/30/2022 1103   PLT 183 09/25/2013 0950   MCV 91.6 08/30/2022 1103   MCV 98 09/25/2013 0950   MCH 30.0 08/30/2022 1103   MCHC 32.7 08/30/2022 1103   RDW 12.4 08/30/2022 1103   RDW 12.4 09/25/2013 0950   LYMPHSABS 1.2 05/23/2021 1227   MONOABS 0.5 05/23/2021 1227   EOSABS 0.2 05/23/2021 1227   BASOSABS 0.1 05/23/2021 1227       Latest Ref Rng & Units 08/30/2022   11:03 AM 08/17/2021    2:07 PM 05/23/2021  12:27 PM  CMP  Glucose 65 - 99 mg/dL 161  97  096   BUN 7 - 25 mg/dL 20  18  12    Creatinine 0.60 - 1.00 mg/dL 0.45  4.09  8.11   Sodium 135 - 146 mmol/L 141  139  141   Potassium 3.5 - 5.3 mmol/L 5.0  4.4  4.0   Chloride 98 - 110 mmol/L 107  105  104   CO2 20 - 32 mmol/L 27  27  27    Calcium 8.6 - 10.4 mg/dL 9.5  9.2  9.2   Total Protein 6.1 - 8.1 g/dL 6.7  7.0    Total Bilirubin 0.2 - 1.2 mg/dL 0.3  0.4    AST 10 - 35 U/L 16  16    ALT 6 - 29 U/L 12  10        Microbiology: 5/24 Sputum Afb positive for MAC  IMAGING RESULTS:  I have personally reviewed the films ?Extensive pleuropulmonary apical scarring Nodules  Impression/Recommendation ? ?COPD/emphysema Smoker    MAI infection of lung- Fibrotic scarring with some pulmonary nodules 1 sputum is positive- will get 2 more The treatment would be azithromycin, ethambutol and rifampin for  1 -2 years Asked her to check her eyes for baseline acuity and color vision To check auditory function Will do labs- hepatitis, LFT Explained the side effects of meds Will follow her after labs to possibly start treatment  Underweight- at risk for  experiencing  more side effects  from meds Recommend seeing nutritionist to improve her protein and calorie intake  ___________________________________________________ Discussed with patient in detail  Note:  This document was prepared using Dragon voice recognition software and may include unintentional dictation errors.

## 2023-02-01 LAB — HEPATITIS B SURFACE ANTIBODY, QUANTITATIVE: Hep B S AB Quant (Post): <3.5 m[IU]/mL — ABNORMAL LOW (ref >9.9)

## 2023-02-04 ENCOUNTER — Other Ambulatory Visit
Admission: RE | Admit: 2023-02-04 | Discharge: 2023-02-04 | Disposition: A | Discharge status: Home / Self Care | Payer: Medicare Other | Source: Ambulatory Visit | Attending: Infectious Diseases | Admitting: Infectious Diseases

## 2023-02-04 DIAGNOSIS — A310 Pulmonary mycobacterial infection: Secondary | ICD-10-CM | POA: Insufficient documentation

## 2023-02-06 ENCOUNTER — Ambulatory Visit: Payer: Medicare Other

## 2023-02-08 LAB — MAC SUSCEPTIBILITY BROTH
Amikacin: 8
Ciprofloxacin: >8
Clarithromycin: 2
Doxycycline: >8
Linezolid: >32
Minocycline: >8
Moxifloxacin: 4
Rifabutin: 0.5
Rifampin: 4
Streptomycin: 16

## 2023-02-08 LAB — ACID FAST ID BY PCR AND SUSCEPTIBILITIES: M Tuberculosis Complex: NEGATIVE

## 2023-02-08 LAB — ACID FAST CULTURE WITH REFLEXED SENSITIVITIES (MYCOBACTERIA): Acid Fast Culture: POSITIVE — AB

## 2023-02-09 LAB — ACID FAST SMEAR (AFB, MYCOBACTERIA): Acid Fast Smear: POSITIVE — AB

## 2023-02-11 LAB — ACID FAST SMEAR (AFB, MYCOBACTERIA)

## 2023-02-13 LAB — ACID FAST CULTURE WITH REFLEXED SENSITIVITIES (MYCOBACTERIA)

## 2023-02-15 LAB — ACID FAST ID BY PCR AND SUSCEPTIBILITIES: M Tuberculosis Complex: NEGATIVE

## 2023-02-18 ENCOUNTER — Telehealth: Payer: Self-pay

## 2023-02-18 NOTE — Telephone Encounter (Signed)
Patient called wanting to know if Dr. Rivka Safer is ready to start her on antibiotic treatment.   Sandie Ano, RN

## 2023-02-19 LAB — ACID FAST ID BY PCR AND SUSCEPTIBILITIES: M avium complex: POSITIVE — AB

## 2023-02-19 LAB — ACID FAST CULTURE WITH REFLEXED SENSITIVITIES (MYCOBACTERIA): Acid Fast Culture: POSITIVE — AB

## 2023-02-21 ENCOUNTER — Telehealth: Payer: Self-pay

## 2023-02-21 NOTE — Telephone Encounter (Signed)
-----   Message from Lynn Ito, MD sent at 02/21/2023  4:05 PM EDT ----- Let us try to get her in next Tuesday for starting rx. Thx  ----- Message ----- From: Leory Plowman, Lab In [place] Sent: 02/13/2023   2:37 PM EDT To: Lynn Ito, MD

## 2023-02-21 NOTE — Telephone Encounter (Signed)
Patient scheduled to come in on 02/26/23 Alejandra Ortiz

## 2023-02-26 ENCOUNTER — Encounter: Payer: Self-pay | Admitting: Pulmonary Disease

## 2023-02-26 ENCOUNTER — Ambulatory Visit: Payer: Medicare Other | Attending: Infectious Diseases | Admitting: Infectious Diseases

## 2023-02-26 ENCOUNTER — Ambulatory Visit (INDEPENDENT_AMBULATORY_CARE_PROVIDER_SITE_OTHER): Payer: Medicare Other | Admitting: Pulmonary Disease

## 2023-02-26 ENCOUNTER — Encounter: Payer: Self-pay | Admitting: Infectious Diseases

## 2023-02-26 VITALS — BP 112/55 | HR 89 | Temp 97.9°F | Ht 60.0 in | Wt 91.0 lb

## 2023-02-26 VITALS — BP 122/80 | HR 80 | Temp 97.8°F | Ht 60.0 in | Wt 91.6 lb

## 2023-02-26 DIAGNOSIS — A310 Pulmonary mycobacterial infection: Secondary | ICD-10-CM | POA: Insufficient documentation

## 2023-02-26 DIAGNOSIS — F17210 Nicotine dependence, cigarettes, uncomplicated: Secondary | ICD-10-CM | POA: Diagnosis not present

## 2023-02-26 DIAGNOSIS — Z681 Body mass index (BMI) 19 or less, adult: Secondary | ICD-10-CM | POA: Diagnosis not present

## 2023-02-26 DIAGNOSIS — R918 Other nonspecific abnormal finding of lung field: Secondary | ICD-10-CM

## 2023-02-26 DIAGNOSIS — R636 Underweight: Secondary | ICD-10-CM | POA: Diagnosis not present

## 2023-02-26 DIAGNOSIS — J439 Emphysema, unspecified: Secondary | ICD-10-CM | POA: Insufficient documentation

## 2023-02-26 DIAGNOSIS — J479 Bronchiectasis, uncomplicated: Secondary | ICD-10-CM | POA: Diagnosis not present

## 2023-02-26 DIAGNOSIS — J449 Chronic obstructive pulmonary disease, unspecified: Secondary | ICD-10-CM | POA: Diagnosis not present

## 2023-02-26 MED ORDER — ETHAMBUTOL HCL 400 MG PO TABS: 15.0000 mg/kg | ORAL_TABLET | Freq: Every day | ORAL | 1 refills | Status: DC

## 2023-02-26 MED ORDER — RIFAMPIN 150 MG PO CAPS: 450.0000 mg | ORAL_CAPSULE | Freq: Every day | ORAL | 1 refills | Status: DC

## 2023-02-26 MED ORDER — AZITHROMYCIN 250 MG PO TABS: ORAL_TABLET | ORAL | 1 refills | Status: DC

## 2023-02-26 MED ORDER — AZITHROMYCIN 250 MG PO TABS: 250.0000 mg | ORAL_TABLET | Freq: Every day | ORAL | 1 refills | Status: DC

## 2023-02-26 NOTE — Progress Notes (Signed)
Subjective:    Patient ID: Alejandra Ortiz, female    DOB: 03/23/1951, 72 y.o.   MRN: [id number]  Patient Care Team: Lorre Munroe, NP as PCP - General (Internal Medicine) Tommie Sams, DO as Consulting Physician [facility] Medicine)  Chief Complaint  Patient presents with   Follow-up    Cough with sputum. SOB. No wheezing.     HPI The patient is a 72 year old current smoker 144-pack-year history of smoking and with significant pulmonary emphysema, who presents for follow-up on COPD and abnormal lung cancer screening scans.  She is currently maintained with Trelegy Ellipta 100, 1 puff daily and as needed albuterol.  She notes doing well with this medication.  She hardly ever has to use her rescue albuterol inhaler.  She was last evaluated here on 17 Jan 2023 seen at that time by Rubye Oaks, NP, this is a scheduled visit.    Patient has been having waxing and waning nodules noted during lung cancer screening.  He has had prior PET/CT on 14 September 2021 that failed to show any findings suspicious for pulmonary neoplasm.  Her most recent low-dose CT chest was performed 21 November 2022.  This was read as a lung RADS 4B, suspicious.  Has been an interval progression of 2 of the more suspicious nodules on the previous study including superior segment of the right lower lobe lesion measuring now 18 mm compared to 11.7 previously.  There is a left lower lobe nodule measuring 20.2 mm today compared to 7 mm previously.  There is a new 7 mm left upper lobe nodule on the last study.  There is also extensive pleural-parenchymal scarring in both lung apices.  The patient remains asymptomatic with regards to the symptoms.  She has baseline short shortness of breath on exertion which is unchanged in character.  Does not report any wheezing.  She has cough productive of whitish sputum in the morning also unchanged in character.  No hemoptysis.  She does have dry cough during the day but this is also unchanged.   She has decreased her cigarette use down to 8 cigarettes/day.  He does not endorse any weight loss or anorexia, lower extremity edema or calf tenderness.  She does not endorse any other symptomatology.  Prior to her visit with NP Parrett I had seen the patient on 04 December 2022.  At that time sputum induction was obtained for mycobacterial culture.  Was also started on Trelegy Ellipta 200 for management of her COPD.  She notes that the Trelegy Ellipta has helped her.  The patient had sputum used on 24 May as she had failed to collect the sputum as noted in April.  The patient was also ordered to have another PET/CT.  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  She was seen by Dr. Rivka Safer today, she is to start MAC therapy after 2 more sputum were positive for M avium complex.  Given these findings the patient counseled PET/CT which at present is reasonable.  The patient was counseled regards to proper nutrition particularly while on therapy for MAC and was also counseled regarding discontinuation of smoking.  We discussed when to proceed with reimaging the chest.  These findings she will also be pulled from the lung cancer screening program and will be followed for this issue here.  She does not endorse any other symptomatology today.   Review of Systems A 10 point review of  systems was performed and it is as noted above otherwise negative.   Patient Active Problem List   Diagnosis Date Noted   Lung nodules 01/17/2023   Absolute anemia 08/30/2022   Aortic atherosclerosis (HCC) 08/17/2021   Hyperlipidemia 02/09/2016   COPD (chronic obstructive pulmonary disease) with emphysema (HCC) 02/07/2016    Social History   Tobacco Use   Smoking status: Every Day    Packs/day: 0.25    Years: 48.00    Additional pack years: 0.00    Total pack years: 12.00    Types: Cigarettes   Smokeless tobacco: Never   Tobacco comments:    4-7 cigarettes  daily- 02/26/2023 khj  Substance Use Topics   Alcohol use: No    Alcohol/week: 0.0 standard drinks of alcohol    No Known Allergies  Current Meds  Medication Sig   albuterol (PROVENTIL) (2.5 MG/3ML) 0.083% nebulizer solution Take 3 mLs (2.5 mg total) by nebulization every 6 (six) hours as needed for wheezing or shortness of breath.   albuterol (VENTOLIN HFA) 108 (90 Base) MCG/ACT inhaler INHALE 2 PUFFS INTO THE LUNGS EVERY 6 HOURS AS NEEDED FOR WHEEZING OR SHORTNESS OF BREATH   aspirin EC 81 MG tablet Take 1 tablet (81 mg total) by mouth daily. Swallow whole.   ethambutol (MYAMBUTOL) 400 MG tablet Take 1.5 tablets (600 mg total) by mouth daily.   Fluticasone-Umeclidin-Vilant (TRELEGY ELLIPTA) 200-62.5-25 MCG/ACT AEPB Inhale 1 puff into the lungs daily.   rifampin (RIFADIN) 150 MG capsule Take 3 capsules (450 mg total) by mouth daily.   [DISCONTINUED] azithromycin (ZITHROMAX) 250 MG tablet For mycobacterium avium treatment    Immunization History  Administered Date(s) Administered   Fluad Quad(high Dose 65+) 07/08/2020, 07/03/2022   Influenza Split 05/27/2014   Influenza, High Dose Seasonal PF 06/07/2017, 06/28/2018, 06/26/2019, 08/03/2021   Influenza-Unspecified 06/25/2016   PFIZER(Purple Top)SARS-COV-2 Vaccination 10/30/2019, 11/20/2019, 05/26/2020   Pneumococcal Conjugate-13 11/08/2014   Pneumococcal Polysaccharide-23 11/19/2016   Td 11/08/2014   Tdap 05/19/2018        Objective:     BP 122/80 (BP Location: Left Arm, Cuff Size: Normal)   Pulse 80   Temp 97.8 F (36.6 C)   Ht 5' (1.524 m)   Wt 91 lb 9.6 oz (41.5 kg)   SpO2 97%   BMI 17.89 kg/m   SpO2: 97 % O2 Device: None (Room air)  GENERAL: Thin well-developed woman, no acute distress, fully ambulatory, no conversational dyspnea. HEAD: Normocephalic, atraumatic.  EYES: Pupils equal, round, reactive to light.  No scleral icterus.  MOUTH: Poor dentition, oral mucosa moist.  No thrush. NECK: Supple. No thyromegaly.  Trachea midline. No JVD.  No adenopathy. PULMONARY: Good air entry bilaterally.  Coarse, rhonchi throughout, no wheezes. CARDIOVASCULAR: S1 and S2. Regular rate and rhythm.  No rubs, murmurs or gallops heard. ABDOMEN: Benign. MUSCULOSKELETAL: No joint deformity, no clubbing, no edema.  NEUROLOGIC: No overt focal deficit, no gait disturbance, speech is fluent. SKIN: Intact,warm,dry. PSYCH: Mood and behavior normal.  Recent Results (from the past 720 hour(s))  Acid Fast Smear (AFB)     Status: None   Collection Time: 02/04/23  3:00 PM   Specimen: Sputum  Result Value Ref Range Status   AFB Specimen Processing Concentration  Final   Acid Fast Smear Positive  Final    Comment: NOTIFIED DR. Ian Bushman 1000 02/11/23 JGF. (NOTE) 4+, more than 36 acid-fast bacilli per field at 400X magnification, fluorescent smear Called/faxed to Karma Lew at 1654 06.15.24 AMD Performed At:  [facility] [address] [place], [place] [id number] Jolene Schimke MD [id number]    Source (AFB) SPUTUM  Final    Comment: Performed at [facility], [address] [place], [place] [address]  Acid Fast Culture with reflexed sensitivities     Status: Abnormal   Collection Time: 02/04/23  3:00 PM   Specimen: Sputum  Result Value Ref Range Status   Acid Fast Culture Positive (A)  Final    Comment: (NOTE) Acid-fast bacilli have been detected in culture at 1 week. Further identification to follow. Performed At: [facility] [address] [place], [place] [id number] Jolene Schimke MD [id number]    Source of Sample SPUTUM  Final    Comment: Performed at [facility], [address] [place], [place] [address]  Acid Fast ID by PCR and Susceptibilities     Status: Abnormal   Collection Time: 02/04/23  3:00 PM  Result Value Ref Range Status   M Tuberculosis Complex Negative  Final    Comment: (NOTE) This test was developed and its performance  characteristics determined by [facility]. It has not been cleared or approved by the Food and Drug Administration.    M avium complex Positive (A)  Final    Comment: (NOTE) This test was developed and its performance characteristics determined by [facility]. It has not been cleared or approved by the Food and Drug Administration.    Reflex ID by MALDI Not Indicated  Final    Comment: (NOTE) Performed At: [facility] [address] [place], [place] [id number] Jolene Schimke MD [id number]    Other: Katherina Right  Final    Comment: No additional identification testing is necessary.   Susceptibility Testing REFERT  Corrected    Comment: (NOTE) Please refer to the following specimen for additional lab results. Susceptibility testing performed under accession # [id number] CORRECTED ON 06/25 AT 1136: PREVIOUSLY REPORTED AS See reflex.      Assessment & Plan:     ICD-10-CM   1. Stage 3 severe COPD by GOLD classification (HCC)  J44.9    Continue Trelegy Ellipta 200, 1 patient daily Continue as needed albuterol Quit smoking!    2. Multiple lung nodules on CT  R91.8 CT CHEST WO CONTRAST   These are likely related to MAC infection Reimage after initiation of MAC therapy    3. 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 Bronchiectasis without complication (HCC)  J47.9    Likely related to MAC No evidence of flare    5. Tobacco dependence due to cigarettes  F17.210    Patient counseled regarding discontinuation of smoking Total counseling time 3 to 5 minutes      Orders Placed This Encounter  Procedures   CT CHEST WO CONTRAST    End of August    Standing Status:   Future    Standing Expiration Date:   02/26/2024    Order Specific Question:   Preferred imaging location?    Answer:   [facility]   Smoking cessation instruction/counseling given:  counseled patient on the dangers of tobacco use, advised  patient to stop smoking, and reviewed strategies to maximize success.  We will obtain CT chest towards the end of August.  Will see the patient in follow-up in 2 to 3 months time she is to contact us prior to that time should any new problems arise.   Gailen Shelter, MD  Advanced Bronchoscopy PCCM [facility]    *This note was dictated using voice recognition software/Dragon.  Despite best efforts to proofread, errors can occur which can change the meaning. Any transcriptional errors that result from this process are unintentional and may not be fully corrected at the time of dictation.

## 2023-02-26 NOTE — Patient Instructions (Addendum)
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

## 2023-02-26 NOTE — Patient Instructions (Addendum)
We are going to check a CT chest towards the end of August.  We will see you in follow-up in 2 to 3 months time call sooner should any problems arise.

## 2023-02-26 NOTE — Progress Notes (Signed)
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 with a history of COPD/emphysema was having routine cancer screeening CT and it was showing progression of nodules and then a cavitary lesion. 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 She did one on 02/04/23  and that was positive for MAI So she was called back to start treatment Pt has morning cough with greenish sputum No fever  No chills She weighs 91 pounds She is a smoker- does not eat much. She has just one meal a day. She drinks 3 cups of coffee and skips breakfast and lunch Lives with husband Research scientist (life sciences) - has a few dogs   Past Medical History:  Diagnosis Date   Chicken pox    COPD (chronic obstructive pulmonary disease) (HCC)    Hyperlipidemia     Past Surgical History:  Procedure Laterality Date   TUBAL LIGATION  1978    Social History   Socioeconomic History   Marital status: Married    Spouse name: Manie Scotto   Number of children: 5   Years of education: Not on file   Highest education level: Not on file  Occupational History   Occupation: Knit    Comment: [employer]  Tobacco Use   Smoking status: Every Day    Packs/day: 0.25    Years: 48.00    Additional pack years: 0.00    Total pack years: 12.00    Types: Cigarettes   Smokeless tobacco: Never   Tobacco comments:    4 cigarettes daily- 01/17/2023 khj  Vaping Use   Vaping Use: Never used  Substance and Sexual Activity   Alcohol use: No    Alcohol/week: 0.0 standard drinks of alcohol   Drug use: No   Sexual activity: Never    Birth control/protection: Surgical  Other Topics Concern   Not on file  Social History Narrative   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   Lives with  husband and 5 children with 12 grandchildren, 4 great-grandchildren   1 dog lives inside    Associates Degree   Right hand dominant    Enjoys reading    Social Determinants of Health   Financial Resource Strain: Low Risk  (08/28/2022)   Overall Financial Resource Strain (CARDIA)    Difficulty of Paying Living Expenses: Not hard at all  Food Insecurity: No Food Insecurity (08/28/2022)   Hunger Vital Sign    Worried About Running Out of Food in the Last Year: Never true    Ran Out of Food in the Last Year: Never true  Transportation Needs: No Transportation Needs (08/28/2022)   PRAPARE - Administrator, Civil Service (Medical): No    Lack of Transportation (Non-Medical): No  Physical Activity: Sufficiently Active (08/28/2022)   Exercise Vital Sign    Days of Exercise per Week: 5 days    Minutes of Exercise per Session: 30 min  Stress: No Stress Concern Present (08/28/2022)   [employer] of Occupational Health - Occupational Stress Questionnaire    Feeling of Stress : Not at all  Social Connections: Moderately Isolated (08/28/2022)   Social Connection and Isolation Panel [NHANES]    Frequency of  Communication with Friends and Family: Once a week    Frequency of Social Gatherings with Friends and Family: More than three times a week    Attends Religious Services: Never    Database administrator or Organizations: No    Attends Banker Meetings: Never    Marital Status: Married  Catering manager Violence: Not At Risk (08/28/2022)   Humiliation, Afraid, Rape, and Kick questionnaire    Fear of Current or Ex-Partner: No    Emotionally Abused: No    Physically Abused: No    Sexually Abused: No    Family History  Family history unknown: Yes  adopted No Known Allergies I? Current Outpatient Medications  Medication Sig Dispense Refill   albuterol (PROVENTIL) (2.5 MG/3ML) 0.083% nebulizer solution Take 3 mLs (2.5 mg total) by nebulization every 6 (six) hours as  needed for wheezing or shortness of breath. 75 mL 5   albuterol (VENTOLIN HFA) 108 (90 Base) MCG/ACT inhaler INHALE 2 PUFFS INTO THE LUNGS EVERY 6 HOURS AS NEEDED FOR WHEEZING OR SHORTNESS OF BREATH 54 g 0   aspirin EC 81 MG tablet Take 1 tablet (81 mg total) by mouth daily. Swallow whole. 30 tablet 12   Fluticasone-Umeclidin-Vilant (TRELEGY ELLIPTA) 200-62.5-25 MCG/ACT AEPB Inhale 1 puff into the lungs daily. 180 each 3   No current facility-administered medications for this visit.     Abtx:  Anti-infectives (From admission, onward)    None       REVIEW OF SYSTEMS:  Const: negative fever, negative chills, negative weight loss Eyes: negative diplopia or visual changes, negative eye pain ENT: negative coryza, negative sore throat Resp:  cough,  green sputum dyspnea Cards: negative for chest pain, palpitations, lower extremity edema GU: negative for frequency, dysuria and hematuria GI: Negative for abdominal pain, diarrhea, bleeding, constipation Skin: negative for rash and pruritus Heme: negative for easy bruising and gum/nose bleeding MS: negative for myalgias, arthralgias, back pain and muscle weakness Neurolo:negative for headaches, dizziness, vertigo, memory problems  Psych: negative for feelings of anxiety, depression  Endocrine: negative for thyroid, diabetes Allergy/Immunology- negative for any medication or food allergies ? Objective:  VITALS:  BP (!) 112/55   Pulse 89   Temp 97.9 F (36.6 C) (Temporal)   Ht 5' (1.524 m)   Wt 91 lb (41.3 kg)   SpO2 96%   BMI 17.77 kg/m   PHYSICAL EXAM:  General: Alert, cooperative, no distress, frail and thin built -BMI low Head: Normocephalic, without obvious abnormality, atraumatic. Eyes: Conjunctivae clear, anicteric sclerae. Pupils are equal ENT Nares normal. No drainage or sinus tenderness. Lips, mucosa, and tongue normal. No Thrush dentures Neck: Supple, symmetrical, no adenopathy, thyroid: non tender no carotid bruit  and no JVD. Back: No CVA tenderness. Lungs: b/l air entry crepts present Heart: Regular rate and rhythm, no murmur, rub or gallop. Abdomen: Soft, non-tender,not distended. Bowel sounds normal. No masses Extremities: atraumatic, no cyanosis. No edema. No clubbing Skin: No rashes or lesions. Or bruising Lymph: Cervical, supraclavicular normal. Neurologic: Grossly non-focal Pertinent Labs Lab Results CBC    Component Value Date/Time   WBC 9.2 01/31/2023 1126   RBC 3.79 (L) 01/31/2023 1126   HGB 10.5 (L) 01/31/2023 1126   HGB 12.3 09/25/2013 0950   HCT 34.2 (L) 01/31/2023 1126   HCT 35.4 09/25/2013 0950   PLT 349 01/31/2023 1126   PLT 183 09/25/2013 0950   MCV 90.2 01/31/2023 1126   MCV 98 09/25/2013 0950   MCH 27.7 01/31/2023 1126  MCHC 30.7 01/31/2023 1126   RDW 14.3 01/31/2023 1126   RDW 12.4 09/25/2013 0950   LYMPHSABS 1.0 01/31/2023 1126   MONOABS 0.5 01/31/2023 1126   EOSABS 0.0 01/31/2023 1126   BASOSABS 0.1 01/31/2023 1126       Latest Ref Rng & Units 01/31/2023   11:26 AM 08/30/2022   11:03 AM 08/17/2021    2:07 PM  CMP  Glucose 70 - 99 mg/dL 161  096  97   BUN 8 - 23 mg/dL 14  20  18    Creatinine 0.44 - 1.00 mg/dL 0.45  4.09  8.11   Sodium 135 - 145 mmol/L 137  141  139   Potassium 3.5 - 5.1 mmol/L 4.0  5.0  4.4   Chloride 98 - 111 mmol/L 103  107  105   CO2 22 - 32 mmol/L 25  27  27    Calcium 8.9 - 10.3 mg/dL 9.1  9.5  9.2   Total Protein 6.5 - 8.1 g/dL 7.5  6.7  7.0   Total Bilirubin 0.3 - 1.2 mg/dL 0.4  0.3  0.4   Alkaline Phos 38 - 126 U/L 81     AST 15 - 41 U/L 22  16  16    ALT 0 - 44 U/L 14  12  10        Microbiology: 5/24 Sputum Afb positive for MAI 02/04/23 -MAI  IMAGING RESULTS:  I have personally reviewed the films ?Extensive pleuropulmonary apical scarring Nodules  Impression/Recommendation ? ?COPD/emphysema Smoker  MAI infection of lung- Fibrotic scarring with some pulmonary nodules and emphysematous changes  The treatment would  be azithromycin, ethambutol ( rifampin) for  1 -2 years Asked her to check her eyes for baseline acuity and color vision To check auditory function Will do labs- hepatitis, LFT Explained the side effects of meds Azithromycin 250mg  once a day Ethambutol 600mg  once aday ( 15mg /kg) Rifampin 450mg  once a day Discussed side effects of meds including nausea,  and if she experiences any severe side effects like  rash, diarrhea, icterus, fever she will have to inform me immediately and antibiotics may have to be changed or stopped  Underweight- at risk for  experiencing  more side effects from meds Discussed in detail how to increase calorie and protein consumption Asked her to eat frequent meals  Gave her information on protein, iron rich food Discussed with her daughter as well  _Follow up 3 weeks Will check labs ( including HIV

## 2023-03-05 ENCOUNTER — Ambulatory Visit: Payer: Medicare Other | Admitting: Infectious Diseases

## 2023-03-19 ENCOUNTER — Encounter: Payer: Self-pay | Admitting: Infectious Diseases

## 2023-03-19 ENCOUNTER — Ambulatory Visit: Payer: Medicare Other | Attending: Infectious Diseases | Admitting: Infectious Diseases

## 2023-03-19 ENCOUNTER — Other Ambulatory Visit
Admission: RE | Admit: 2023-03-19 | Discharge: 2023-03-19 | Disposition: A | Discharge status: Home / Self Care | Payer: Medicare Other | Source: Ambulatory Visit | Attending: Infectious Diseases | Admitting: Infectious Diseases

## 2023-03-19 VITALS — BP 138/74 | HR 87 | Temp 97.0°F | Ht 60.0 in | Wt 90.0 lb

## 2023-03-19 DIAGNOSIS — D649 Anemia, unspecified: Secondary | ICD-10-CM

## 2023-03-19 DIAGNOSIS — Z7251 High risk heterosexual behavior: Secondary | ICD-10-CM

## 2023-03-19 DIAGNOSIS — F17210 Nicotine dependence, cigarettes, uncomplicated: Secondary | ICD-10-CM | POA: Insufficient documentation

## 2023-03-19 DIAGNOSIS — R918 Other nonspecific abnormal finding of lung field: Secondary | ICD-10-CM

## 2023-03-19 DIAGNOSIS — R634 Abnormal weight loss: Secondary | ICD-10-CM

## 2023-03-19 DIAGNOSIS — A310 Pulmonary mycobacterial infection: Secondary | ICD-10-CM | POA: Diagnosis not present

## 2023-03-19 DIAGNOSIS — Z681 Body mass index (BMI) 19 or less, adult: Secondary | ICD-10-CM | POA: Insufficient documentation

## 2023-03-19 DIAGNOSIS — R636 Underweight: Secondary | ICD-10-CM | POA: Insufficient documentation

## 2023-03-19 DIAGNOSIS — J449 Chronic obstructive pulmonary disease, unspecified: Secondary | ICD-10-CM | POA: Insufficient documentation

## 2023-03-19 LAB — CBC WITH DIFFERENTIAL/PLATELET
Abs Immature Granulocytes: 0.04 10*3/uL (ref 0.00–0.07)
Basophils Absolute: 0.1 10*3/uL (ref 0.0–0.1)
Basophils Relative: 1 %
Eosinophils Absolute: 0.1 10*3/uL (ref 0.0–0.5)
Eosinophils Relative: 2 %
HCT: 39.8 % (ref 36.0–46.0)
Hemoglobin: 12.4 g/dL (ref 12.0–15.0)
Immature Granulocytes: 1 %
Lymphocytes Relative: 15 %
Lymphs Abs: 1.1 10*3/uL (ref 0.7–4.0)
MCH: 27.9 pg (ref 26.0–34.0)
MCHC: 31.2 g/dL (ref 30.0–36.0)
MCV: 89.6 fL (ref 80.0–100.0)
Monocytes Absolute: 0.5 10*3/uL (ref 0.1–1.0)
Monocytes Relative: 7 %
Neutro Abs: 5.5 10*3/uL (ref 1.7–7.7)
Neutrophils Relative %: 74 %
Platelets: 343 10*3/uL (ref 150–400)
RBC: 4.44 MIL/uL (ref 3.87–5.11)
RDW: 14.5 % (ref 11.5–15.5)
WBC: 7.3 10*3/uL (ref 4.0–10.5)
nRBC: 0 % (ref 0.0–0.2)

## 2023-03-19 LAB — COMPREHENSIVE METABOLIC PANEL
ALT: 17 U/L (ref 0–44)
AST: 16 U/L (ref 15–41)
Albumin: 3.5 g/dL (ref 3.5–5.0)
Alkaline Phosphatase: 86 U/L (ref 38–126)
Anion gap: 9 (ref 5–15)
BUN: 11 mg/dL (ref 8–23)
CO2: 26 mmol/L (ref 22–32)
Calcium: 9.4 mg/dL (ref 8.9–10.3)
Chloride: 101 mmol/L (ref 98–111)
Creatinine, Ser: 0.85 mg/dL (ref 0.44–1.00)
GFR, Estimated: >60 mL/min (ref >60)
Glucose, Bld: 106 mg/dL — ABNORMAL HIGH (ref 70–99)
Potassium: 4.6 mmol/L (ref 3.5–5.1)
Sodium: 136 mmol/L (ref 135–145)
Total Bilirubin: 0.4 mg/dL (ref 0.3–1.2)
Total Protein: 7.9 g/dL (ref 6.5–8.1)

## 2023-03-19 NOTE — Progress Notes (Signed)
NAME: Alejandra Ortiz  DOB: [birth year]  MRN: [id number]  Date/Time: 03/19/2023 8:58 AM  Subjective:   ?follow up of MAC- treatment started 3 weeks ago Eating better Feeling tired On Rifapin 450mg , ethambutol 600mg  and azithromycin 250mg  adjusted to her weight of 90 pounds Smoking 6 /day Takes micotine lozenges when she goes out Following taken from the note last time Alejandra Ortiz is a 72 y.o. with a history of COPD/emphysema was having routine cancer screeening CT and it was showing progression of nodules and then a cavitary lesion. 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 She did one on 02/04/23  and that was positive for MAI So she was called back to start treatment Pt has morning cough with greenish sputum No fever  No chills She weighs 91 pounds She is a smoker- does not eat much. She has just one meal a day. She drinks 3 cups of coffee and skips breakfast and lunch Lives with husband Research scientist (life sciences) - has a few dogs   Past Medical History:  Diagnosis Date   Chicken pox    COPD (chronic obstructive pulmonary disease) (HCC)    Hyperlipidemia     Past Surgical History:  Procedure Laterality Date   TUBAL LIGATION  1978    Social History   Socioeconomic History   Marital status: Married    Spouse name: Alyxis Grippi   Number of children: 5   Years of education: Not on file   Highest education level: Not on file  Occupational History   Occupation: Knit    Comment: [employer]  Tobacco Use   Smoking status: Every Day    Current packs/day: 0.25    Average packs/day: 0.3 packs/day for 48.0 years (12.0 ttl pk-yrs)    Types: Cigarettes   Smokeless tobacco: Never   Tobacco comments:    4-7 cigarettes daily- 02/26/2023 khj  Vaping Use   Vaping status: Never Used  Substance and Sexual Activity   Alcohol use: No    Alcohol/week: 0.0 standard drinks of alcohol    Drug use: No   Sexual activity: Never    Birth control/protection: Surgical  Other Topics Concern   Not on file  Social History Narrative   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   Lives with husband and 5 children with 12 grandchildren, 4 great-grandchildren   1 dog lives inside    Associates Degree   Right hand dominant    Enjoys reading    Social Determinants of Health   Financial Resource Strain: Low Risk  (08/28/2022)   Overall Financial Resource Strain (CARDIA)    Difficulty of Paying Living Expenses: Not hard at all  Food Insecurity: No Food Insecurity (08/28/2022)   Hunger Vital Sign    Worried About Running Out of Food in the Last Year: Never true    Ran Out of Food in the Last Year: Never true  Transportation Needs: No Transportation Needs (08/28/2022)   PRAPARE - Administrator, Civil Service (Medical): No    Lack of Transportation (Non-Medical): No  Physical Activity: Sufficiently Active (08/28/2022)   Exercise Vital Sign    Days of Exercise per Week: 5 days    Minutes of Exercise per Session: 30 min  Stress: No Stress Concern Present (08/28/2022)   [employer] of Occupational Health - Occupational Stress Questionnaire  Feeling of Stress : Not at all  Social Connections: Moderately Isolated (08/28/2022)   Social Connection and Isolation Panel [NHANES]    Frequency of Communication with Friends and Family: Once a week    Frequency of Social Gatherings with Friends and Family: More than three times a week    Attends Religious Services: Never    Database administrator or Organizations: No    Attends Banker Meetings: Never    Marital Status: Married  Catering manager Violence: Not At Risk (08/28/2022)   Humiliation, Afraid, Rape, and Kick questionnaire    Fear of Current or Ex-Partner: No    Emotionally Abused: No    Physically Abused: No    Sexually Abused: No    Family History  Family history unknown: Yes  adopted No Known  Allergies I? Current Outpatient Medications  Medication Sig Dispense Refill   albuterol (PROVENTIL) (2.5 MG/3ML) 0.083% nebulizer solution Take 3 mLs (2.5 mg total) by nebulization every 6 (six) hours as needed for wheezing or shortness of breath. 75 mL 5   albuterol (VENTOLIN HFA) 108 (90 Base) MCG/ACT inhaler INHALE 2 PUFFS INTO THE LUNGS EVERY 6 HOURS AS NEEDED FOR WHEEZING OR SHORTNESS OF BREATH 54 g 0   aspirin EC 81 MG tablet Take 1 tablet (81 mg total) by mouth daily. Swallow whole. 30 tablet 12   azithromycin (ZITHROMAX) 250 MG tablet Take 1 tablet (250 mg total) by mouth daily. For mycobacterium avium treatmentFor mycobacterium avium treatment 30 each 1   ethambutol (MYAMBUTOL) 400 MG tablet Take 1.5 tablets (600 mg total) by mouth daily. 45 tablet 1   Fluticasone-Umeclidin-Vilant (TRELEGY ELLIPTA) 200-62.5-25 MCG/ACT AEPB Inhale 1 puff into the lungs daily. 180 each 3   rifampin (RIFADIN) 150 MG capsule Take 3 capsules (450 mg total) by mouth daily. 90 capsule 1   No current facility-administered medications for this visit.     Abtx:  Anti-infectives (From admission, onward)    None       REVIEW OF SYSTEMS:  Const: negative fever, negative chills, negative weight loss Eyes: negative diplopia or visual changes, negative eye pain ENT: negative coryza, negative sore throat Resp:  cough,  green sputum dyspnea Cards: negative for chest pain, palpitations, lower extremity edema GU: negative for frequency, dysuria and hematuria GI: Negative for abdominal pain, diarrhea, bleeding, constipation Skin: negative for rash and pruritus Heme: negative for easy bruising and gum/nose bleeding MS: negative for myalgias, arthralgias, back pain and muscle weakness Neurolo:negative for headaches, dizziness, vertigo, memory problems  Psych: negative for feelings of anxiety, depression  Endocrine: negative for thyroid, diabetes Allergy/Immunology- negative for any medication or food  allergies ? Objective:  VITALS:  BP 138/74   Pulse 87   Temp (!) 97 F (36.1 C) (Temporal)   Ht 5' (1.524 m)   Wt 90 lb (40.8 kg)   BMI 17.58 kg/m   PHYSICAL EXAM:  General: Alert, cooperative, no distress, frail and thin built -BMI low Head: Normocephalic, without obvious abnormality, atraumatic. Eyes: Conjunctivae clear, anicteric sclerae. Pupils are equal ENT Nares normal. No drainage or sinus tenderness. Lips, mucosa, and tongue normal. No Thrush dentures Neck: Supple, symmetrical, no adenopathy, thyroid: non tender no carotid bruit and no JVD. Back: No CVA tenderness. Lungs: b/l air entry crepts present Heart: Regular rate and rhythm, no murmur, rub or gallop. Abdomen: Soft, non-tender,not distended. Bowel sounds normal. No masses Extremities: atraumatic, no cyanosis. No edema. No clubbing Skin: No rashes or lesions. Or bruising Lymph:  Cervical, supraclavicular normal. Neurologic: Grossly non-focal Pertinent Labs Lab Results CBC    Component Value Date/Time   WBC 9.2 01/31/2023 1126   RBC 3.79 (L) 01/31/2023 1126   HGB 10.5 (L) 01/31/2023 1126   HGB 12.3 09/25/2013 0950   HCT 34.2 (L) 01/31/2023 1126   HCT 35.4 09/25/2013 0950   PLT 349 01/31/2023 1126   PLT 183 09/25/2013 0950   MCV 90.2 01/31/2023 1126   MCV 98 09/25/2013 0950   MCH 27.7 01/31/2023 1126   MCHC 30.7 01/31/2023 1126   RDW 14.3 01/31/2023 1126   RDW 12.4 09/25/2013 0950   LYMPHSABS 1.0 01/31/2023 1126   MONOABS 0.5 01/31/2023 1126   EOSABS 0.0 01/31/2023 1126   BASOSABS 0.1 01/31/2023 1126       Latest Ref Rng & Units 01/31/2023   11:26 AM 08/30/2022   11:03 AM 08/17/2021    2:07 PM  CMP  Glucose 70 - 99 mg/dL 161  096  97   BUN 8 - 23 mg/dL 14  20  18    Creatinine 0.44 - 1.00 mg/dL 0.45  4.09  8.11   Sodium 135 - 145 mmol/L 137  141  139   Potassium 3.5 - 5.1 mmol/L 4.0  5.0  4.4   Chloride 98 - 111 mmol/L 103  107  105   CO2 22 - 32 mmol/L 25  27  27    Calcium 8.9 - 10.3 mg/dL 9.1   9.5  9.2   Total Protein 6.5 - 8.1 g/dL 7.5  6.7  7.0   Total Bilirubin 0.3 - 1.2 mg/dL 0.4  0.3  0.4   Alkaline Phos 38 - 126 U/L 81     AST 15 - 41 U/L 22  16  16    ALT 0 - 44 U/L 14  12  10        Microbiology: 5/24 Sputum Afb positive for MAI 02/04/23 -MAI  IMAGING RESULTS:  I have personally reviewed the films ?Extensive pleuropulmonary apical scarring Nodules  Impression/Recommendation ? ?COPD/emphysema Smoker  MAI infection of lung- Fibrotic scarring with some pulmonary nodules and emphysematous changes  Started  azithromycin, ethambutol ( rifampin) 2 weeks ago may need for  1 -2 years Azithromycin 250mg  once a day Ethambutol 600mg  once aday ( 15mg /kg) Rifampin 450mg  once a day She had eye examination baseline acuity and color vision Had auditory test and getting hearing aids Will do labs-cbc/cmp Discussed side effects of meds including nausea,  and if she experiences any severe side effects like  rash, diarrhea, icterus, fever she will have to inform me immediately and antibiotics may have to be changed or stopped  Underweight- at risk for  experiencing  more side effects from meds Discussed in detail how to increase calorie and protein consumption Asked her to eat frequent meals  Gave her information on protein, iron rich food  Follow up 1 month

## 2023-03-22 ENCOUNTER — Telehealth: Payer: Self-pay

## 2023-03-22 NOTE — Telephone Encounter (Signed)
-----   Message from Lynn Ito sent at 03/21/2023  5:45 PM EDT ----- Please let her know that her labs look fine ----- Message ----- From: Interface, [facility] [place] Sent: 03/19/2023  10:04 AM EDT To: Lynn Ito, MD

## 2023-03-22 NOTE — Telephone Encounter (Signed)
I spoke to the patient and relayed lab results. Patient verbalized understanding. Alejandra Ortiz  

## 2023-04-17 ENCOUNTER — Other Ambulatory Visit: Payer: Self-pay | Admitting: Infectious Diseases

## 2023-04-18 ENCOUNTER — Ambulatory Visit: Payer: Medicare Other | Attending: Infectious Diseases | Admitting: Infectious Diseases

## 2023-04-18 ENCOUNTER — Other Ambulatory Visit
Admission: RE | Admit: 2023-04-18 | Discharge: 2023-04-18 | Disposition: A | Discharge status: Home / Self Care | Payer: Medicare Other | Source: Ambulatory Visit | Attending: Infectious Diseases | Admitting: Infectious Diseases

## 2023-04-18 VITALS — BP 125/63 | HR 72 | Temp 97.1°F | Ht 61.0 in | Wt 93.0 lb

## 2023-04-18 DIAGNOSIS — Z792 Long term (current) use of antibiotics: Secondary | ICD-10-CM | POA: Diagnosis not present

## 2023-04-18 DIAGNOSIS — J439 Emphysema, unspecified: Secondary | ICD-10-CM | POA: Insufficient documentation

## 2023-04-18 DIAGNOSIS — A310 Pulmonary mycobacterial infection: Secondary | ICD-10-CM | POA: Diagnosis not present

## 2023-04-18 DIAGNOSIS — J449 Chronic obstructive pulmonary disease, unspecified: Secondary | ICD-10-CM | POA: Diagnosis not present

## 2023-04-18 DIAGNOSIS — R636 Underweight: Secondary | ICD-10-CM | POA: Insufficient documentation

## 2023-04-18 DIAGNOSIS — F17210 Nicotine dependence, cigarettes, uncomplicated: Secondary | ICD-10-CM | POA: Diagnosis not present

## 2023-04-18 DIAGNOSIS — Z681 Body mass index (BMI) 19 or less, adult: Secondary | ICD-10-CM | POA: Insufficient documentation

## 2023-04-18 LAB — CBC WITH DIFFERENTIAL/PLATELET
Abs Immature Granulocytes: 0.02 10*3/uL (ref 0.00–0.07)
Basophils Absolute: 0.1 10*3/uL (ref 0.0–0.1)
Basophils Relative: 1 %
Eosinophils Absolute: 0.1 10*3/uL (ref 0.0–0.5)
Eosinophils Relative: 1 %
HCT: 40.1 % (ref 36.0–46.0)
Hemoglobin: 12.4 g/dL (ref 12.0–15.0)
Immature Granulocytes: 0 %
Lymphocytes Relative: 13 %
Lymphs Abs: 1 10*3/uL (ref 0.7–4.0)
MCH: 28.5 pg (ref 26.0–34.0)
MCHC: 30.9 g/dL (ref 30.0–36.0)
MCV: 92.2 fL (ref 80.0–100.0)
Monocytes Absolute: 0.5 10*3/uL (ref 0.1–1.0)
Monocytes Relative: 6 %
Neutro Abs: 6.2 10*3/uL (ref 1.7–7.7)
Neutrophils Relative %: 79 %
Platelets: 287 10*3/uL (ref 150–400)
RBC: 4.35 MIL/uL (ref 3.87–5.11)
RDW: 14.8 % (ref 11.5–15.5)
WBC: 7.9 10*3/uL (ref 4.0–10.5)
nRBC: 0 % (ref 0.0–0.2)

## 2023-04-18 LAB — COMPREHENSIVE METABOLIC PANEL
ALT: 15 U/L (ref 0–44)
AST: 18 U/L (ref 15–41)
Albumin: 3.5 g/dL (ref 3.5–5.0)
Alkaline Phosphatase: 83 U/L (ref 38–126)
Anion gap: 12 (ref 5–15)
BUN: 7 mg/dL — ABNORMAL LOW (ref 8–23)
CO2: 25 mmol/L (ref 22–32)
Calcium: 9.1 mg/dL (ref 8.9–10.3)
Chloride: 99 mmol/L (ref 98–111)
Creatinine, Ser: 0.85 mg/dL (ref 0.44–1.00)
GFR, Estimated: >60 mL/min (ref >60)
Glucose, Bld: 113 mg/dL — ABNORMAL HIGH (ref 70–99)
Potassium: 4.2 mmol/L (ref 3.5–5.1)
Sodium: 136 mmol/L (ref 135–145)
Total Bilirubin: 0.6 mg/dL (ref 0.3–1.2)
Total Protein: 8.1 g/dL (ref 6.5–8.1)

## 2023-04-18 MED ORDER — ETHAMBUTOL HCL 400 MG PO TABS: 15.0000 mg/kg | ORAL_TABLET | Freq: Every day | ORAL | 2 refills | Status: DC

## 2023-04-18 MED ORDER — RIFAMPIN 150 MG PO CAPS: 450.0000 mg | ORAL_CAPSULE | Freq: Every day | ORAL | 2 refills | Status: DC

## 2023-04-18 MED ORDER — AZITHROMYCIN 250 MG PO TABS: 250.0000 mg | ORAL_TABLET | Freq: Every day | ORAL | 2 refills | Status: DC

## 2023-04-18 NOTE — Progress Notes (Signed)
NAME: Alejandra Ortiz  DOB: [birth year]  MRN: [id number]  Date/Time: 04/18/2023 8:40 AM  Subjective:   ?follow up of MAC- treatment started 6 weeks ago Eating better- having 3 meals ( previously should skip breakfast and lunch and just have one meal ) Says she gained 3 pounds On Rifampin 450mg , ethambutol 600mg  and azithromycin 250mg  adjusted to her weight  She has no side effects from the medicine , tolerating well She went back to work after she quit 2 months ago. She works 8 hours a day  Smoking 6 /day  Takes nicotine lozenges when she goes out Following taken from the note last time  Alejandra Ortiz is a 72 y.o. with a history of COPD/emphysema was having routine cancer screeening CT and it was showing progression of nodules and then a cavitary lesion. 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 She did one on 02/04/23  and that was positive for MAI So she was called back to start treatment Pt has morning cough with greenish sputum No fever  No chills    Past Medical History:  Diagnosis Date   Chicken pox    COPD (chronic obstructive pulmonary disease) (HCC)    Hyperlipidemia     Past Surgical History:  Procedure Laterality Date   TUBAL LIGATION  1978    Social History   Socioeconomic History   Marital status: Married    Spouse name: Anneta Schroff   Number of children: 5   Years of education: Not on file   Highest education level: Not on file  Occupational History   Occupation: Knit    Comment: [employer]  Tobacco Use   Smoking status: Every Day    Current packs/day: 0.25    Average packs/day: 0.3 packs/day for 48.0 years (12.0 ttl pk-yrs)    Types: Cigarettes   Smokeless tobacco: Never   Tobacco comments:    4-7 cigarettes daily- 02/26/2023 khj  Vaping Use   Vaping status: Never Used  Substance and Sexual Activity   Alcohol use: No    Alcohol/week:  0.0 standard drinks of alcohol   Drug use: No   Sexual activity: Never    Birth control/protection: Surgical  Other Topics Concern   Not on file  Social History Narrative   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   Lives with husband and 5 children with 12 grandchildren, 4 great-grandchildren   1 dog lives inside    Associates Degree   Right hand dominant    Enjoys reading    Social Determinants of Health   Financial Resource Strain: Low Risk  (08/28/2022)   Overall Financial Resource Strain (CARDIA)    Difficulty of Paying Living Expenses: Not hard at all  Food Insecurity: No Food Insecurity (08/28/2022)   Hunger Vital Sign    Worried About Running Out of Food in the Last Year: Never true    Ran Out of Food in the Last Year: Never true  Transportation Needs: No Transportation Needs (08/28/2022)   PRAPARE - Administrator, Civil Service (Medical): No    Lack of Transportation (Non-Medical): No  Physical Activity: Sufficiently Active (08/28/2022)   Exercise Vital Sign    Days of Exercise per Week: 5 days    Minutes of Exercise per Session: 30 min  Stress: No Stress Concern Present (08/28/2022)   [employer]  of Occupational Health - Occupational Stress Questionnaire    Feeling of Stress : Not at all  Social Connections: Moderately Isolated (08/28/2022)   Social Connection and Isolation Panel [NHANES]    Frequency of Communication with Friends and Family: Once a week    Frequency of Social Gatherings with Friends and Family: More than three times a week    Attends Religious Services: Never    Database administrator or Organizations: No    Attends Banker Meetings: Never    Marital Status: Married  Catering manager Violence: Not At Risk (08/28/2022)   Humiliation, Afraid, Rape, and Kick questionnaire    Fear of Current or Ex-Partner: No    Emotionally Abused: No    Physically Abused: No    Sexually Abused: No    Family History  Family history unknown:  Yes  adopted No Known Allergies I? Current Outpatient Medications  Medication Sig Dispense Refill   albuterol (PROVENTIL) (2.5 MG/3ML) 0.083% nebulizer solution Take 3 mLs (2.5 mg total) by nebulization every 6 (six) hours as needed for wheezing or shortness of breath. 75 mL 5   albuterol (VENTOLIN HFA) 108 (90 Base) MCG/ACT inhaler INHALE 2 PUFFS INTO THE LUNGS EVERY 6 HOURS AS NEEDED FOR WHEEZING OR SHORTNESS OF BREATH 54 g 0   aspirin EC 81 MG tablet Take 1 tablet (81 mg total) by mouth daily. Swallow whole. 30 tablet 12   azithromycin (ZITHROMAX) 250 MG tablet Take 1 tablet (250 mg total) by mouth daily. For mycobacterium avium treatmentFor mycobacterium avium treatment 30 each 1   ethambutol (MYAMBUTOL) 400 MG tablet Take 1.5 tablets (600 mg total) by mouth daily. 45 tablet 1   Fluticasone-Umeclidin-Vilant (TRELEGY ELLIPTA) 200-62.5-25 MCG/ACT AEPB Inhale 1 puff into the lungs daily. 180 each 3   rifampin (RIFADIN) 150 MG capsule Take 3 capsules (450 mg total) by mouth daily. 90 capsule 1   No current facility-administered medications for this visit.     Abtx:  Anti-infectives (From admission, onward)    None       REVIEW OF SYSTEMS:  Const: negative fever, negative chills, negative weight loss Eyes: negative diplopia or visual changes, negative eye pain ENT: negative coryza, negative sore throat Resp:  cough,  green sputum dyspnea Cards: negative for chest pain, palpitations, lower extremity edema GU: negative for frequency, dysuria and hematuria GI: Negative for abdominal pain, diarrhea, bleeding, constipation Skin: negative for rash and pruritus Heme: negative for easy bruising and gum/nose bleeding MS: negative for myalgias, arthralgias, back pain and muscle weakness Neurolo:negative for headaches, dizziness, vertigo, memory problems  Psych: negative for feelings of anxiety, depression  Endocrine: negative for thyroid, diabetes Allergy/Immunology- negative for any  medication or food allergies ? Objective:  VITALS:  BP 125/63   Pulse 72   Temp (!) 97.1 F (36.2 C) (Temporal)   Ht 5\' 1"  (1.549 m)   Wt 93 lb (42.2 kg)   SpO2 97%   BMI 17.57 kg/m   PHYSICAL EXAM:  General: Alert, cooperative, no distress, frail and thin built -BMI low Head: Normocephalic, without obvious abnormality, atraumatic. Eyes: Conjunctivae clear, anicteric sclerae. Pupils are equal ENT Nares normal. No drainage or sinus tenderness. Lips, mucosa, and tongue normal. No Thrush dentures Neck: Supple, symmetrical, no adenopathy, thyroid: non tender no carotid bruit and no JVD. Back: No CVA tenderness. Lungs: b/l air entry crepts present Heart: Regular rate and rhythm, no murmur, rub or gallop. Abdomen: Soft, non-tender,not distended. Bowel sounds normal. No masses Extremities:  atraumatic, no cyanosis. No edema. No clubbing Skin: No rashes or lesions. Or bruising Lymph: Cervical, supraclavicular normal. Neurologic: Grossly non-focal Pertinent Labs Lab Results CBC    Component Value Date/Time   WBC 7.3 03/19/2023 0929   RBC 4.44 03/19/2023 0929   HGB 12.4 03/19/2023 0929   HGB 12.3 09/25/2013 0950   HCT 39.8 03/19/2023 0929   HCT 35.4 09/25/2013 0950   PLT 343 03/19/2023 0929   PLT 183 09/25/2013 0950   MCV 89.6 03/19/2023 0929   MCV 98 09/25/2013 0950   MCH 27.9 03/19/2023 0929   MCHC 31.2 03/19/2023 0929   RDW 14.5 03/19/2023 0929   RDW 12.4 09/25/2013 0950   LYMPHSABS 1.1 03/19/2023 0929   MONOABS 0.5 03/19/2023 0929   EOSABS 0.1 03/19/2023 0929   BASOSABS 0.1 03/19/2023 0929       Latest Ref Rng & Units 03/19/2023    9:29 AM 01/31/2023   11:26 AM 08/30/2022   11:03 AM  CMP  Glucose 70 - 99 mg/dL 161  096  045   BUN 8 - 23 mg/dL 11  14  20    Creatinine 0.44 - 1.00 mg/dL 4.09  8.11  9.14   Sodium 135 - 145 mmol/L 136  137  141   Potassium 3.5 - 5.1 mmol/L 4.6  4.0  5.0   Chloride 98 - 111 mmol/L 101  103  107   CO2 22 - 32 mmol/L 26  25  27     Calcium 8.9 - 10.3 mg/dL 9.4  9.1  9.5   Total Protein 6.5 - 8.1 g/dL 7.9  7.5  6.7   Total Bilirubin 0.3 - 1.2 mg/dL 0.4  0.4  0.3   Alkaline Phos 38 - 126 U/L 86  81    AST 15 - 41 U/L 16  22  16    ALT 0 - 44 U/L 17  14  12        Microbiology: 5/24 Sputum Afb positive for MAI 02/04/23 -MAI  IMAGING RESULTS:  I have personally reviewed the films ?Extensive pleuropulmonary apical scarring Nodules  Impression/Recommendation ? ?COPD/emphysema Smoker  MAI infection of lung- Fibrotic scarring with some pulmonary nodules and emphysematous changes  On azithromycin, ethambutol  and rifampin started 1st week July No side effects so far Will do labs today She is getting CT scan chest next week and seeing Dr. Jayme Cloud after that  Azithromycin 250mg  once a day Ethambutol 600mg  once aday ( 15mg /kg) Rifampin 450mg  once a day She had eye examination baseline acuity and color vision Had auditory test and getting hearing aids  Discussed side effects of meds including nausea,  and if she experiences any severe side effects like  rash, diarrhea, icterus, fever she will have to inform me immediately and antibiotics may have to be changed or stopped  Underweight-  Discussed in detail how to increase calorie and protein consumption Asked her to eat frequent meals  Gave her information on protein, iron rich food  Follow up 2 months

## 2023-04-19 ENCOUNTER — Telehealth: Payer: Self-pay

## 2023-04-19 NOTE — Telephone Encounter (Signed)
-----   Message from Lynn Ito sent at 04/18/2023  2:32 PM EDT ----- Please let her know that her labs look good- Liver, kidney are good ----- Message ----- From: Interface, Lab In [place] Sent: 04/18/2023   9:20 AM EDT To: Lynn Ito, MD

## 2023-04-19 NOTE — Telephone Encounter (Signed)
Patient informed of lab results and verbalized understanding.  Alejandra Ortiz  

## 2023-04-24 ENCOUNTER — Ambulatory Visit
Admission: RE | Admit: 2023-04-24 | Discharge: 2023-04-24 | Disposition: A | Discharge status: Home / Self Care | Payer: Medicare Other | Source: Ambulatory Visit | Attending: Internal Medicine | Admitting: Internal Medicine

## 2023-04-24 DIAGNOSIS — R918 Other nonspecific abnormal finding of lung field: Secondary | ICD-10-CM | POA: Insufficient documentation

## 2023-04-24 DIAGNOSIS — A310 Pulmonary mycobacterial infection: Secondary | ICD-10-CM | POA: Insufficient documentation

## 2023-05-01 IMAGING — CT CT CHEST LUNG CANCER SCREENING LOW DOSE W/O CM
2 of 5 series · 15 of 40 positions shown, 18 images · non-contrast
Comparison: CT lung cancer screening dated February 05, 2019

CLINICAL DATA: Marker with 75 pack-year history

EXAM:
CT CHEST WITHOUT CONTRAST LOW-DOSE FOR LUNG CANCER SCREENING
TECHNIQUE: Multidetector CT imaging of the chest was performed following the
standard protocol without IV contrast.

[Series 3: lung 1.00 · axial · 0.53mm/px · z∈[-1220,-903]mm · 12 of 349 slices shown, 15 images]
[im 16/349  mediastinal]
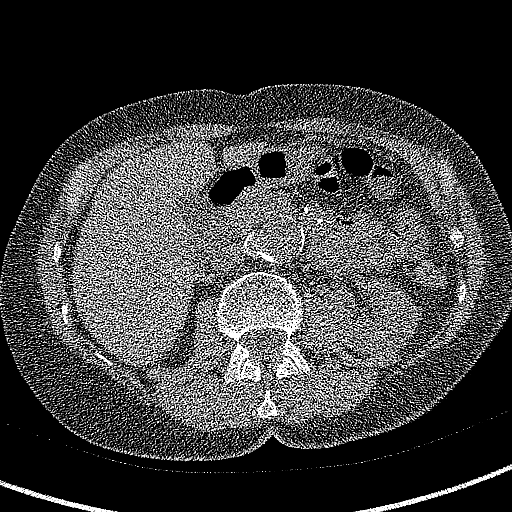
[im 16/349  lung]
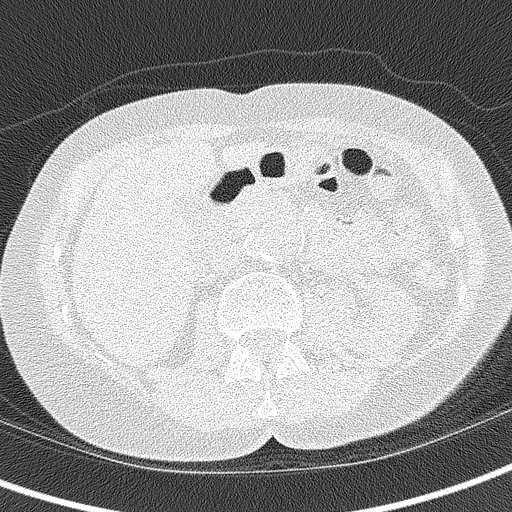
[im 48/349  lung]
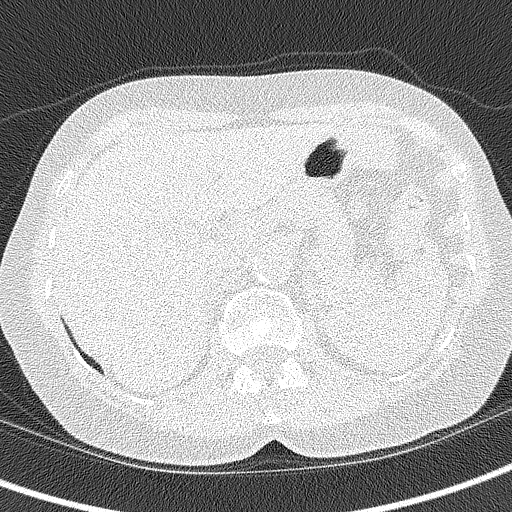
[im 80/349  lung]
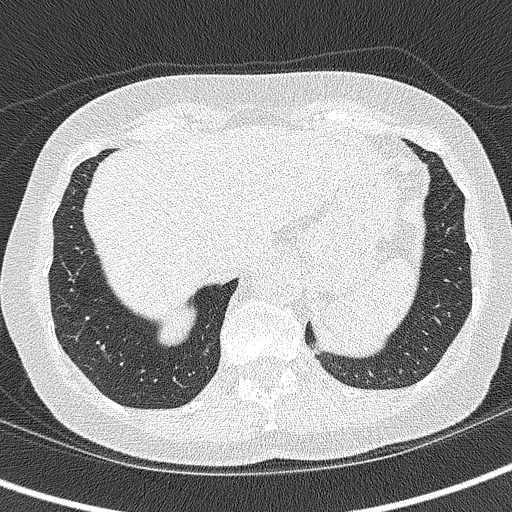
[im 111/349  lung]
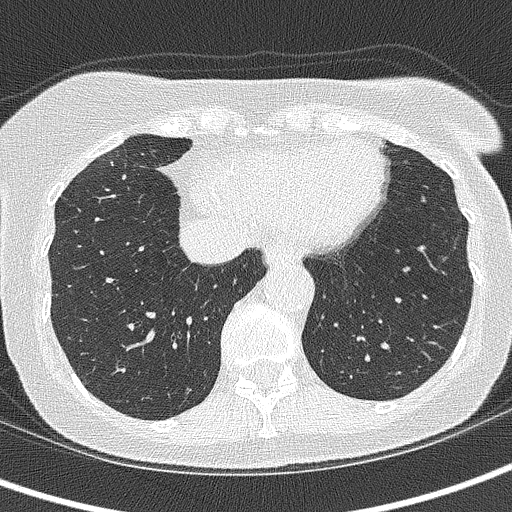
[im 127/349  mediastinal]
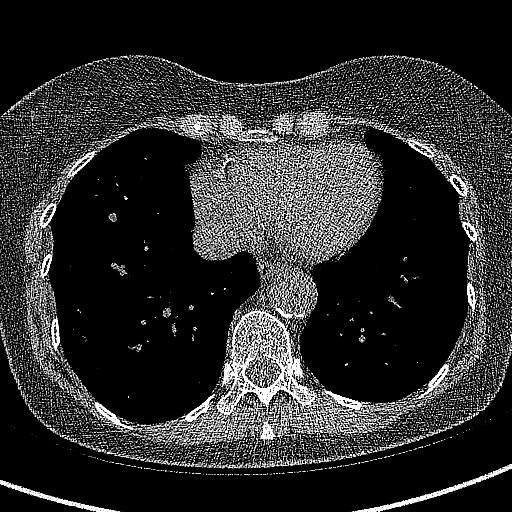
[im 127/349  lung]
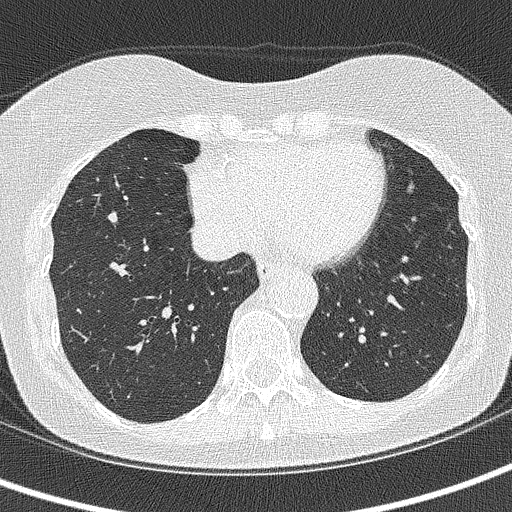
[im 159/349  lung]
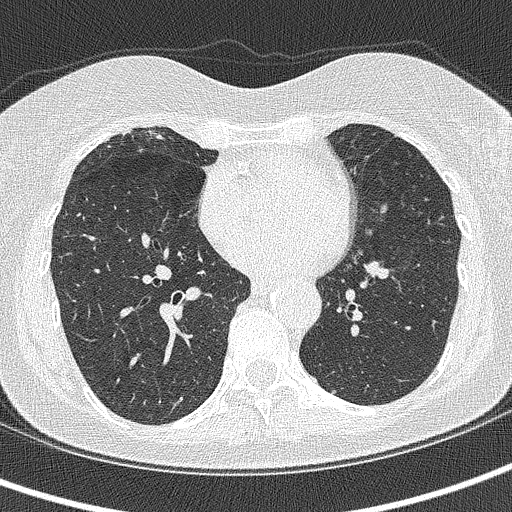
[im 190/349  lung]
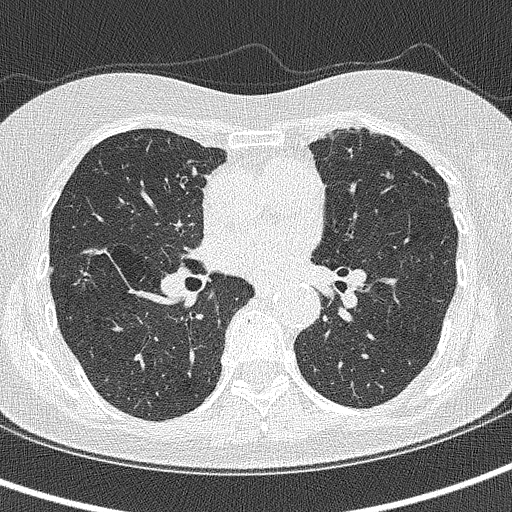
[im 222/349  lung]
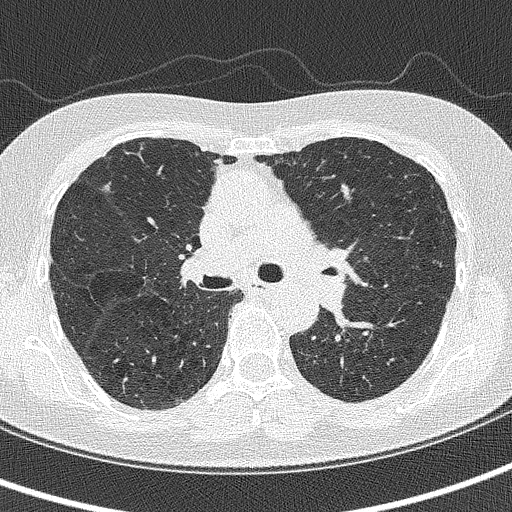
[im 238/349  mediastinal]
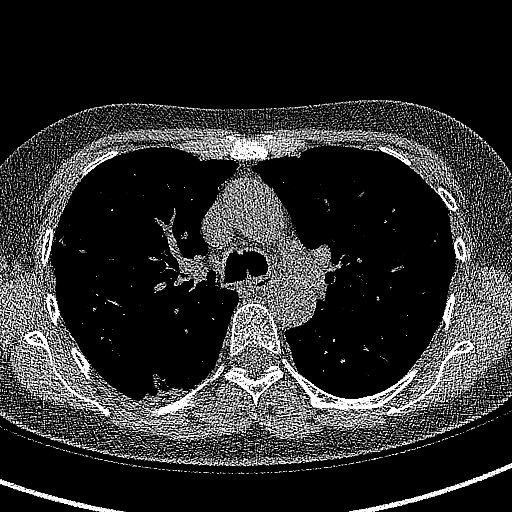
[im 238/349  lung]
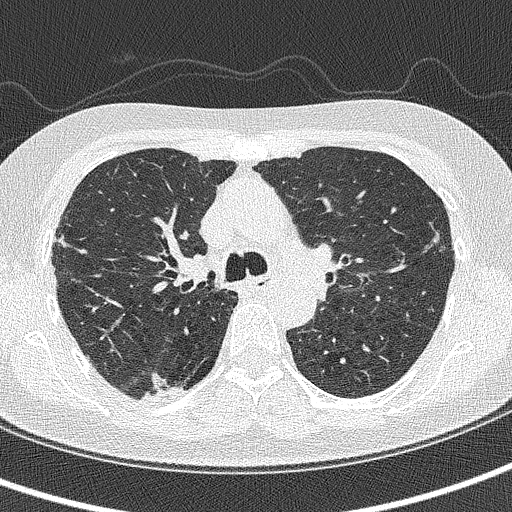
[im 269/349  lung]
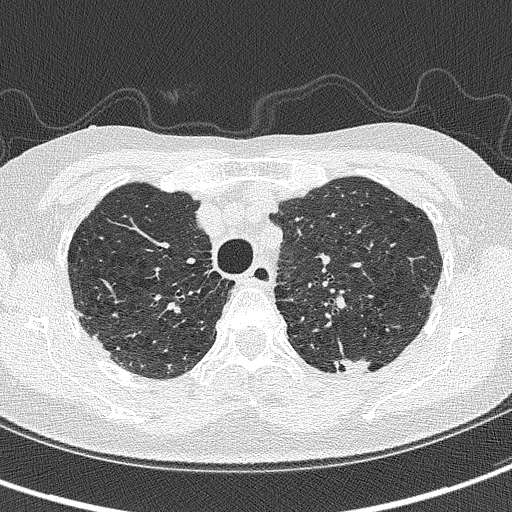
[im 301/349  lung]
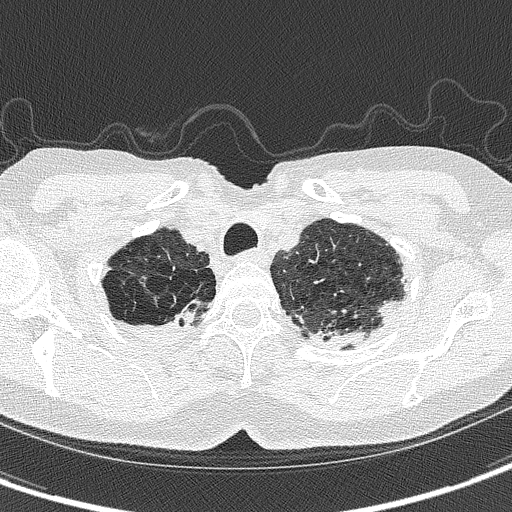
[im 333/349  lung]
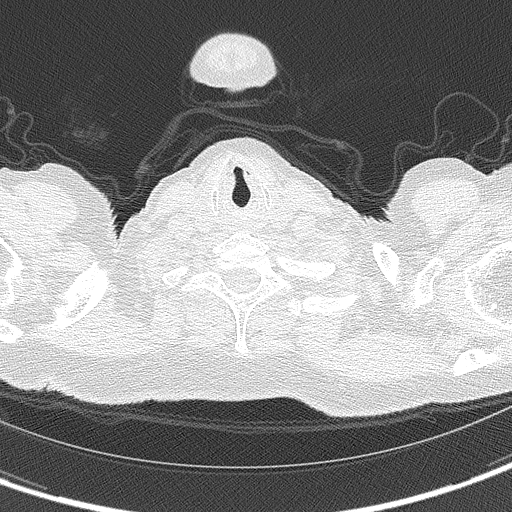

[Series 5: coronals lung 1.00 cor · coronal · 0.53mm/px · 3 of 217 slices shown]
[im 44/217  lung]
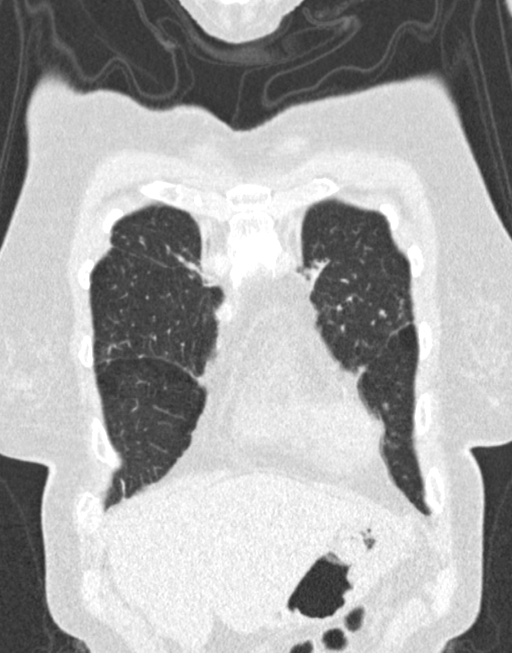
[im 87/217  lung]
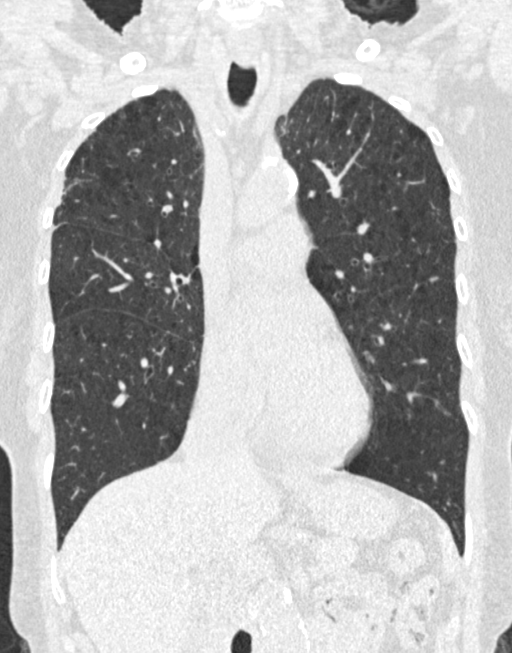
[im 130/217  lung]
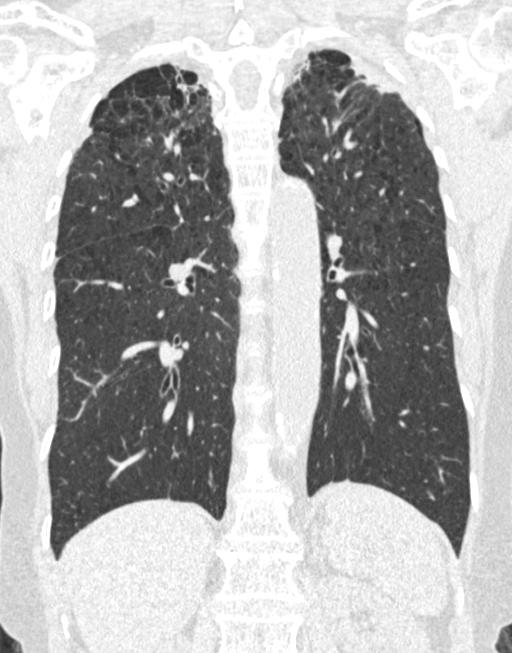

[15 of 40 positions shown; findings below may reference images not displayed]

FINDINGS: Cardiovascular: Normal heart size. No pericardial effusion.
Three-vessel coronary artery calcifications. Atherosclerotic disease
of the thoracic aorta.

Mediastinum/Nodes: No pathologically enlarged lymph nodes seen in
the chest. Mildly patulous esophagus.

Lungs/Pleura: Central airways are patent. Emphysema. Upper lobe
predominant centrilobular emphysema. Biapical pleuroparenchymal
scarring, unchanged compared to prior exam. New bilateral irregular
solid nodular opacities. Largest is located in the right lung apex
and measures to 10.9 cm on image 35. Additional reference nodule of
the right middle lobe measuring 9.9 mm on image 248.

Upper Abdomen: No acute abnormality.

Musculoskeletal: No chest wall mass or suspicious bone lesions
identified.
IMPRESSION: 1. New bilateral irregular solid pulmonary nodules, largest is
located in the right lung apex and measures up to 10.9 cm, findings
are suspicious but could also be due to progressive chronic atypical
infection. Lung-RADS 4B, suspicious. Additional imaging evaluation
or consultation with Pulmonology or Thoracic Surgery recommended.
2. Three-vessel coronary artery calcifications.
3. Aortic Atherosclerosis (0FMMO-M65.5) and Emphysema (0FMMO-5VX.7).

## 2023-05-07 ENCOUNTER — Encounter: Payer: Self-pay | Admitting: Pulmonary Disease

## 2023-05-07 ENCOUNTER — Ambulatory Visit (INDEPENDENT_AMBULATORY_CARE_PROVIDER_SITE_OTHER): Payer: Medicare Other | Admitting: Pulmonary Disease

## 2023-05-07 VITALS — BP 124/84 | HR 74 | Temp 98.5°F | Ht 61.0 in | Wt 91.4 lb

## 2023-05-07 DIAGNOSIS — Z23 Encounter for immunization: Secondary | ICD-10-CM

## 2023-05-07 DIAGNOSIS — J479 Bronchiectasis, uncomplicated: Secondary | ICD-10-CM | POA: Diagnosis not present

## 2023-05-07 DIAGNOSIS — F17210 Nicotine dependence, cigarettes, uncomplicated: Secondary | ICD-10-CM

## 2023-05-07 DIAGNOSIS — A310 Pulmonary mycobacterial infection: Secondary | ICD-10-CM | POA: Diagnosis not present

## 2023-05-07 DIAGNOSIS — R918 Other nonspecific abnormal finding of lung field: Secondary | ICD-10-CM | POA: Diagnosis not present

## 2023-05-07 DIAGNOSIS — J449 Chronic obstructive pulmonary disease, unspecified: Secondary | ICD-10-CM | POA: Diagnosis not present

## 2023-05-07 MED ORDER — VARENICLINE TARTRATE 0.5 MG PO TABS: 0.5000 mg | ORAL_TABLET | Freq: Two times a day (BID) | ORAL | 0 refills | Status: DC

## 2023-05-07 MED ORDER — VARENICLINE TARTRATE 1 MG PO TABS: 1.0000 mg | ORAL_TABLET | Freq: Two times a day (BID) | ORAL | 1 refills | Status: DC

## 2023-05-07 NOTE — Progress Notes (Signed)
Subjective:    Patient ID: Alejandra Ortiz, female    DOB: 01/01/1951, 72 y.o.   MRN: [id number]  Patient Care Team: Lorre Munroe, NP as PCP - General (Internal Medicine) Tommie Sams, DO as Consulting Physician [facility])  Chief Complaint  Patient presents with   Follow-up    DOE.No wheezing. Cough with green to clear sputum in the morning.    HPI The patient is a 72 year old current smoker with significant pulmonary emphysema and MAC, who presents for follow-up on COPD and abnormal lung cancer screening scans.  She is currently maintained with Trelegy Ellipta 200, 1 puff daily and as needed albuterol.  She notes doing well with these medications.  She hardly ever has to use her rescue albuterol inhaler.  She was last seen here on 26 February 2023, this is a scheduled visit.  At that time she was to start MAC therapy per ID.  She has since started that and she has been tolerating the therapy.   Recall that patient has been having waxing and waning nodules noted during lung cancer screening.  He has had prior PET/CT on 14 September 2021 that failed to show any findings suspicious for pulmonary neoplasm.  Her most recent low-dose CT chest was performed 21 November 2022.  This was read as a lung RADS 4B, suspicious.  There had been an interval progression of 2 of the more suspicious nodules on the previous study including superior segment of the right lower lobe lesion measuring now 18 mm compared to 11.7 previously.  There is a left lower lobe nodule measuring 20.2 mm today compared to 7 mm previously.  There is a new 7 mm left upper lobe nodule on the last study.  There is also extensive pleural-parenchymal scarring in both lung apices.  She has had a follow-up CT chest on 24 April 2023 after starting therapy for MAC.  This has been independently reviewed and reviewed with the patient.  This shows waxing and waning cavitary nodules/opacities in the lungs bilaterally compatible with the history  of atypical mycobacterial infection.  The patient remains asymptomatic with regards to the symptoms.  She has baseline short shortness of breath on exertion which is unchanged in character.  She does not report any wheezing.  She has cough productive of whitish sputum in the morning also unchanged in character.  No hemoptysis.  She does have occasionally a dry cough during the day but this is also unchanged.  She has decreased her cigarette use down to 4-7 cigarettes/day.  She he does not endorse any weight loss or anorexia, lower extremity edema or calf tenderness.    The patient has been counseled with regards to proper nutrition particularly while on therapy for MAC and was also counseled regarding discontinuation of smoking.  We discussed when to proceed with reimaging the chest. ill be followed for this issue here.  Because of the issues with MAC, the patient has been pulled from the lung cancer screening program and will be followed from her MAC disease standpoint.  We discussed strategies for smoking cessation.  She is willing to try Chantix again.  She has tried this in the past.  She needs influenza vaccine today.    Review of Systems A 10 point review of systems was performed and it is as noted above otherwise negative.   Patient Active Problem List   Diagnosis Date Noted   Lung nodules 01/17/2023   Absolute anemia 08/30/2022   Aortic atherosclerosis (  HCC) 08/17/2021   Hyperlipidemia 02/09/2016   COPD (chronic obstructive pulmonary disease) with emphysema (HCC) 02/07/2016    Social History   Tobacco Use   Smoking status: Every Day    Current packs/day: 0.25    Average packs/day: 0.3 packs/day for 48.0 years (12.0 ttl pk-yrs)    Types: Cigarettes   Smokeless tobacco: Never   Tobacco comments:    4-7 cigarettes daily- 05/07/2023 khj  Substance Use Topics   Alcohol use: No    Alcohol/week: 0.0 standard drinks of alcohol    No Known Allergies  Current Meds  Medication Sig    albuterol (PROVENTIL) (2.5 MG/3ML) 0.083% nebulizer solution Take 3 mLs (2.5 mg total) by nebulization every 6 (six) hours as needed for wheezing or shortness of breath.   albuterol (VENTOLIN HFA) 108 (90 Base) MCG/ACT inhaler INHALE 2 PUFFS INTO THE LUNGS EVERY 6 HOURS AS NEEDED FOR WHEEZING OR SHORTNESS OF BREATH   aspirin EC 81 MG tablet Take 1 tablet (81 mg total) by mouth daily. Swallow whole.   azithromycin (ZITHROMAX) 250 MG tablet Take 1 tablet (250 mg total) by mouth daily. For mycobacterium avium treatmentFor mycobacterium avium treatment   ethambutol (MYAMBUTOL) 400 MG tablet Take 1.5 tablets (600 mg total) by mouth daily.   Fluticasone-Umeclidin-Vilant (TRELEGY ELLIPTA) 200-62.5-25 MCG/ACT AEPB Inhale 1 puff into the lungs daily.   rifampin (RIFADIN) 150 MG capsule Take 3 capsules (450 mg total) by mouth daily.    Immunization History  Administered Date(s) Administered   Fluad Quad(high Dose 65+) 07/08/2020, 07/03/2022   Influenza Split 05/27/2014   Influenza, High Dose Seasonal PF 06/07/2017, 06/28/2018, 06/26/2019, 08/03/2021   Influenza-Unspecified 06/25/2016   PFIZER(Purple Top)SARS-COV-2 Vaccination 10/30/2019, 11/20/2019, 05/26/2020   Pneumococcal Conjugate-13 11/08/2014   Pneumococcal Polysaccharide-23 11/19/2016   Td 11/08/2014   Tdap 05/19/2018        Objective:     BP 124/84 (BP Location: Right Arm, Cuff Size: Normal)   Pulse 74   Temp 98.5 F (36.9 C)   Ht 5\' 1"  (1.549 m)   Wt 91 lb 6.4 oz (41.5 kg)   SpO2 99%   BMI 17.27 kg/m   SpO2: 99 % O2 Device: None (Room air)  GENERAL: Thin well-developed woman, no acute distress, fully ambulatory, no conversational dyspnea. HEAD: Normocephalic, atraumatic.  EYES: Pupils equal, round, reactive to light.  No scleral icterus.  MOUTH: Poor dentition, oral mucosa moist.  No thrush. NECK: Supple. No thyromegaly. Trachea midline. No JVD.  No adenopathy. PULMONARY: Good air entry bilaterally.  Coarse, no  adventitious sounds. CARDIOVASCULAR: S1 and S2. Regular rate and rhythm.  No rubs, murmurs or gallops heard. ABDOMEN: Benign. MUSCULOSKELETAL: No joint deformity, no clubbing, no edema.  NEUROLOGIC: No overt focal deficit, no gait disturbance, speech is fluent. SKIN: Intact,warm,dry. PSYCH: Mood and behavior normal.    Assessment & Plan:     ICD-10-CM   1. Stage 3 severe COPD by GOLD classification (HCC)  J44.9    Continue Trelegy Ellipta 200 Continue as needed albuterol Stop smoking    2. 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 Multiple lung nodules on CT  R91.8    Waxing and waning Likely related to MAI Continue to follow expectantly    5. Tobacco dependence due to cigarettes  F17.210  Patient counseled in regards to discontinuation of smoking Patient willing to try Chantix again Prescription sent    6. Encounter for immunization  Z23 Flu Vaccine Trivalent High Dose (Fluad)   Patient received influenza vaccine today      Orders Placed This Encounter  Procedures   Flu Vaccine Trivalent High Dose (Fluad)   Meds ordered this encounter  Medications   varenicline (CHANTIX) 0.5 MG tablet    Sig: Take 1 tablet (0.5 mg total) by mouth 2 (two) times daily. First month pack.    Dispense:  60 tablet    Refill:  0   varenicline (CHANTIX CONTINUING MONTH PAK) 1 MG tablet    Sig: Take 1 tablet (1 mg total) by mouth 2 (two) times daily. Start month 2    Dispense:  60 tablet    Refill:  1   Smoking cessation instruction/counseling given:  counseled patient on the dangers of tobacco use, advised patient to stop smoking, and reviewed strategies to maximize success.  Will see the patient in follow-up in 3 months time she is to contact us prior to that time should any new difficulties  arise.  Gailen Shelter, MD Advanced Bronchoscopy PCCM [facility]    *This note was dictated using voice recognition software/Dragon.  Despite best efforts to proofread, errors can occur which can change the meaning. Any transcriptional errors that result from this process are unintentional and may not be fully corrected at the time of dictation.

## 2023-05-07 NOTE — Patient Instructions (Signed)
You received your flu vaccine today.  We sent a prescription for Chantix to [facility].  Send follow-up in 3 months time call sooner should any problems arise.

## 2023-05-11 ENCOUNTER — Encounter: Payer: Self-pay | Admitting: Pulmonary Disease

## 2023-05-31 ENCOUNTER — Ambulatory Visit: Payer: Self-pay

## 2023-05-31 ENCOUNTER — Ambulatory Visit (INDEPENDENT_AMBULATORY_CARE_PROVIDER_SITE_OTHER): Payer: Medicare Other | Admitting: Internal Medicine

## 2023-05-31 ENCOUNTER — Encounter: Payer: Self-pay | Admitting: Internal Medicine

## 2023-05-31 VITALS — BP 136/70 | HR 86 | Temp 96.8°F | Wt 85.0 lb

## 2023-05-31 DIAGNOSIS — R109 Unspecified abdominal pain: Secondary | ICD-10-CM

## 2023-05-31 DIAGNOSIS — R3989 Other symptoms and signs involving the genitourinary system: Secondary | ICD-10-CM

## 2023-05-31 LAB — POCT URINALYSIS DIPSTICK
Bilirubin, UA: NEGATIVE
Glucose, UA: NEGATIVE
Ketones, UA: NEGATIVE
Leukocytes, UA: NEGATIVE
Nitrite, UA: NEGATIVE
Protein, UA: NEGATIVE
Spec Grav, UA: 1.01 (ref 1.010–1.025)
Urobilinogen, UA: 0.2 U/dL
pH, UA: 6 (ref 5.0–8.0)

## 2023-05-31 MED ORDER — NITROFURANTOIN MONOHYD MACRO 100 MG PO CAPS: 100.0000 mg | ORAL_CAPSULE | Freq: Two times a day (BID) | ORAL | 0 refills | Status: DC

## 2023-05-31 NOTE — Progress Notes (Signed)
Subjective:    Patient ID: Alejandra Ortiz, female    DOB: [birth year], 72 y.o.   MRN: [id number]  HPI  Patient presents to clinic today with complaint of bilateral flank pain and bladder pressure.  She reports this started 2 days ago.  She denies urgency, frequency, dysuria or blood in her urine.  She denies vaginal discharge, itching, odor or abnormal vaginal bleeding.  She denies fever, chills, nausea or vomiting.  She has not taken anything OTC for this.  She is currently taking rifampin, azithromycin and ethambutol for MAC.  She will be on these antibiotics for a year and they do make her urine turn orange.  Review of Systems  Past Medical History:  Diagnosis Date   Chicken pox    COPD (chronic obstructive pulmonary disease) (HCC)    Hyperlipidemia     Current Outpatient Medications  Medication Sig Dispense Refill   albuterol (PROVENTIL) (2.5 MG/3ML) 0.083% nebulizer solution Take 3 mLs (2.5 mg total) by nebulization every 6 (six) hours as needed for wheezing or shortness of breath. 75 mL 5   albuterol (VENTOLIN HFA) 108 (90 Base) MCG/ACT inhaler INHALE 2 PUFFS INTO THE LUNGS EVERY 6 HOURS AS NEEDED FOR WHEEZING OR SHORTNESS OF BREATH 54 g 0   aspirin EC 81 MG tablet Take 1 tablet (81 mg total) by mouth daily. Swallow whole. 30 tablet 12   azithromycin (ZITHROMAX) 250 MG tablet Take 1 tablet (250 mg total) by mouth daily. 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 45 tablet 2   Fluticasone-Umeclidin-Vilant (TRELEGY ELLIPTA) 200-62.5-25 MCG/ACT AEPB Inhale 1 puff into the lungs daily. 180 each 3   rifampin (RIFADIN) 150 MG capsule Take 3 capsules (450 mg total) by mouth daily. 90 capsule 2   [START ON 06/03/2023] varenicline (CHANTIX CONTINUING MONTH PAK) 1 MG tablet Take 1 tablet (1 mg total) by mouth 2 (two) times daily. Start month 2 60 tablet 1   varenicline  (CHANTIX) 0.5 MG tablet Take 1 tablet (0.5 mg total) by mouth 2 (two) times daily. First month pack. 60 tablet 0   No current facility-administered medications for this visit.    No Known Allergies  Family History  Family history unknown: Yes    Social History   Socioeconomic History   Marital status: Married    Spouse name: Ismelda Weatherman   Number of children: 5   Years of education: Not on file   Highest education level: Not on file  Occupational History   Occupation: Knit    Comment: [employer]  Tobacco Use   Smoking status: Every Day    Current packs/day: 0.25    Average packs/day: 0.3 packs/day for 48.0 years (12.0 ttl pk-yrs)    Types: Cigarettes   Smokeless tobacco: Never   Tobacco comments:    4-7 cigarettes daily- 05/07/2023 khj  Vaping Use   Vaping status: Never Used  Substance and Sexual Activity   Alcohol use: No    Alcohol/week: 0.0 standard drinks of alcohol   Drug use: No   Sexual activity: Never    Birth control/protection: Surgical  Other Topics Concern   Not on file  Social History Narrative   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   Lives with husband and 5 children with 12 grandchildren, 4 great-grandchildren   1 dog lives inside    Associates Degree   Right hand  dominant    Enjoys reading    Social Determinants of Health   Financial Resource Strain: Low Risk  (08/28/2022)   Overall Financial Resource Strain (CARDIA)    Difficulty of Paying Living Expenses: Not hard at all  Food Insecurity: No Food Insecurity (08/28/2022)   Hunger Vital Sign    Worried About Running Out of Food in the Last Year: Never true    Ran Out of Food in the Last Year: Never true  Transportation Needs: No Transportation Needs (08/28/2022)   PRAPARE - Administrator, Civil Service (Medical): No    Lack of Transportation (Non-Medical): No  Physical Activity: Sufficiently Active (08/28/2022)   Exercise Vital Sign    Days of Exercise per Week: 5 days     Minutes of Exercise per Session: 30 min  Stress: No Stress Concern Present (08/28/2022)   [employer] of Occupational Health - Occupational Stress Questionnaire    Feeling of Stress : Not at all  Social Connections: Moderately Isolated (08/28/2022)   Social Connection and Isolation Panel [NHANES]    Frequency of Communication with Friends and Family: Once a week    Frequency of Social Gatherings with Friends and Family: More than three times a week    Attends Religious Services: Never    Database administrator or Organizations: No    Attends Banker Meetings: Never    Marital Status: Married  Catering manager Violence: Not At Risk (08/28/2022)   Humiliation, Afraid, Rape, and Kick questionnaire    Fear of Current or Ex-Partner: No    Emotionally Abused: No    Physically Abused: No    Sexually Abused: No     Constitutional: Denies fever, malaise, fatigue, headache or abrupt weight changes.  Respiratory: Denies difficulty breathing, shortness of breath, cough or sputum production.   Cardiovascular: Denies chest pain, chest tightness, palpitations or swelling in the hands or feet.  Gastrointestinal: Pt reports bilateral flank pain. Denies bloating, constipation, diarrhea or blood in the stool.  GU: Patient reports bladder pressure.  Denies urgency, frequency, pain with urination, burning sensation, blood in urine, odor or discharge.   No other specific complaints in a complete review of systems (except as listed in HPI above).     Objective:   Physical Exam BP 136/70 (BP Location: Right Arm, Patient Position: Sitting, Cuff Size: Normal)   Pulse 86   Temp (!) 96.8 F (36 C) (Temporal)   Wt 85 lb (38.6 kg)   SpO2 98%   BMI 16.06 kg/m   Wt Readings from Last 3 Encounters:  05/07/23 91 lb 6.4 oz (41.5 kg)  04/18/23 93 lb (42.2 kg)  03/19/23 90 lb (40.8 kg)    General: Appears her stated age, weight, chronically ill-appearing, in NAD. Cardiovascular: Normal rate  and rhythm. S1,S2 noted.  No murmur, rubs or gallops noted.  Pulmonary/Chest: Normal effort and positive vesicular breath sounds. No respiratory distress. No wheezes, rales or ronchi noted.  Abdomen: Soft with mild suprapubic tenderness. Normal bowel sounds. No CVA tenderness noted Neurological: Alert and oriented.   BMET    Component Value Date/Time   NA 136 04/18/2023 0913   NA 137 09/25/2013 0950   K 4.2 04/18/2023 0913   K 3.6 09/25/2013 0950   CL 99 04/18/2023 0913   CL 106 09/25/2013 0950   CO2 25 04/18/2023 0913   CO2 27 09/25/2013 0950   GLUCOSE 113 (H) 04/18/2023 0913   GLUCOSE 103 (H) 09/25/2013 0950  BUN 7 (L) 04/18/2023 0913   BUN 14 09/25/2013 0950   CREATININE 0.85 04/18/2023 0913   CREATININE 0.80 08/30/2022 1103   CALCIUM 9.1 04/18/2023 0913   CALCIUM 8.9 09/25/2013 0950   GFRNONAA >60 04/18/2023 0913   GFRNONAA 57 (L) 09/25/2013 0950   GFRAA >60 09/25/2013 0950    Lipid Panel     Component Value Date/Time   CHOL 182 08/30/2022 1103   TRIG 50 08/30/2022 1103   HDL 73 08/30/2022 1103   CHOLHDL 2.5 08/30/2022 1103   VLDL 12.0 03/17/2019 1401   LDLCALC 96 08/30/2022 1103    CBC    Component Value Date/Time   WBC 7.9 04/18/2023 0913   RBC 4.35 04/18/2023 0913   HGB 12.4 04/18/2023 0913   HGB 12.3 09/25/2013 0950   HCT 40.1 04/18/2023 0913   HCT 35.4 09/25/2013 0950   PLT 287 04/18/2023 0913   PLT 183 09/25/2013 0950   MCV 92.2 04/18/2023 0913   MCV 98 09/25/2013 0950   MCH 28.5 04/18/2023 0913   MCHC 30.9 04/18/2023 0913   RDW 14.8 04/18/2023 0913   RDW 12.4 09/25/2013 0950   LYMPHSABS 1.0 04/18/2023 0913   MONOABS 0.5 04/18/2023 0913   EOSABS 0.1 04/18/2023 0913   BASOSABS 0.1 04/18/2023 0913    Hgb A1C Lab Results  Component Value Date   HGBA1C 5.4 11/17/2014             Assessment & Plan:   Bilateral flank plain, bladder pressure:  Urinalysis: Moderate blood We will send urine culture Push fluids Rx for Macrobid 100 mg  twice daily x 10 days If symptoms worsen over the weekend, would recommend ER eval for CT scan renal stone study  Schedule a follow up chronic conditions Nicki Reaper, NP

## 2023-05-31 NOTE — Telephone Encounter (Signed)
  Chief Complaint: Back pain abdominal pain Symptoms: above Frequency: past few days Pertinent Negatives: Patient denies  Disposition: [] ED /[] Urgent Care (no appt availability in office) / [x] Appointment(In office/virtual)/ []  [facility]/ [] Home Care/ [] Refused Recommended Disposition /[] [facility]/ []  Follow-up with PCP Additional Notes: Pt states that she has both back and abdominal pain. Started as back pain. Pt thinks this is a kidney or UTI. Pt states she is on 3 ABX at this time. Appt made for this afternoon.  Summary: Potential UIT or Kidney Infection   Patient is calling in because she believes she has a kidney or bladder infection. Pt says she has pain in lower back. Pt says pain is tolerable but uncomfortable. Pt wants to know can she get an antibiotic or if she needs to go to the hospital     Reason for Disposition  [1] MODERATE back pain (e.g., interferes with normal activities) AND [2] present > 3 days  Answer Assessment - Initial Assessment Questions 1. ONSET: "When did the pain begin?"      2 days ago 2. LOCATION: "Where does it hurt?" (upper, mid or lower back)     Lower back and  3. SEVERITY: "How bad is the pain?"  (e.g., Scale 1-10; mild, moderate, or severe)   - MILD (1-3): Doesn't interfere with normal activities.    - MODERATE (4-7): Interferes with normal activities or awakens from sleep.    - SEVERE (8-10): Excruciating pain, unable to do any normal activities.      moderate 4. PATTERN: "Is the pain constant?" (e.g., yes, no; constant, intermittent)      yes 5. RADIATION: "Does the pain shoot into your legs or somewhere else?"     abdomen 6. CAUSE:  "What do you think is causing the back pain?"      Kidney infection  or UTI 8. MEDICINES: "What have you taken so far for the pain?" (e.g., nothing, acetaminophen, NSAIDS)     Pt states that she is currently on 3 abx.  Protocols used: Back Pain-A-AH

## 2023-05-31 NOTE — Patient Instructions (Signed)
Urinary Tract Infection, Adult A urinary tract infection (UTI) is an infection of any part of the urinary tract. The urinary tract includes: The kidneys. The ureters. The bladder. The urethra. These organs make, store, and get rid of pee (urine) in the body. What are the causes? This infection is caused by germs (bacteria) in your genital area. These germs grow and cause swelling (inflammation) of your urinary tract. What increases the risk? The following factors may make you more likely to develop this condition: Using a small, thin tube (catheter) to drain pee. Not being able to control when you pee or poop (incontinence). Being female. If you are female, these things can increase the risk: Using these methods to prevent pregnancy: A medicine that kills sperm (spermicide). A device that blocks sperm (diaphragm). Having low levels of a female hormone (estrogen). Being pregnant. You are more likely to develop this condition if: You have genes that add to your risk. You are sexually active. You take antibiotic medicines. You have trouble peeing because of: A prostate that is bigger than normal, if you are female. A blockage in the part of your body that drains pee from the bladder. A kidney stone. A nerve condition that affects your bladder. Not getting enough to drink. Not peeing often enough. You have other conditions, such as: Diabetes. A weak disease-fighting system (immune system). Sickle cell disease. Gout. Injury of the spine. What are the signs or symptoms? Symptoms of this condition include: Needing to pee right away. Peeing small amounts often. Pain or burning when peeing. Blood in the pee. Pee that smells bad or not like normal. Trouble peeing. Pee that is cloudy. Fluid coming from the vagina, if you are female. Pain in the belly or lower back. Other symptoms include: Vomiting. Not feeling hungry. Feeling mixed up (confused). This may be the first symptom in  older adults. Being tired and grouchy (irritable). A fever. Watery poop (diarrhea). How is this treated? Taking antibiotic medicine. Taking other medicines. Drinking enough water. In some cases, you may need to see a specialist. Follow these instructions at home:  Medicines Take over-the-counter and prescription medicines only as told by your doctor. If you were prescribed an antibiotic medicine, take it as told by your doctor. Do not stop taking it even if you start to feel better. General instructions Make sure you: Pee until your bladder is empty. Do not hold pee for a long time. Empty your bladder after sex. Wipe from front to back after peeing or pooping if you are a female. Use each tissue one time when you wipe. Drink enough fluid to keep your pee pale yellow. Keep all follow-up visits. Contact a doctor if: You do not get better after 1-2 days. Your symptoms go away and then come back. Get help right away if: You have very bad back pain. You have very bad pain in your lower belly. You have a fever. You have chills. You feeling like you will vomit or you vomit. Summary A urinary tract infection (UTI) is an infection of any part of the urinary tract. This condition is caused by germs in your genital area. There are many risk factors for a UTI. Treatment includes antibiotic medicines. Drink enough fluid to keep your pee pale yellow. This information is not intended to replace advice given to you by your health care provider. Make sure you discuss any questions you have with your health care provider. Document Revised: 03/20/2020 Document Reviewed: 03/25/2020 Elsevier Patient Education    2024 Elsevier Inc.  

## 2023-06-01 LAB — URINE CULTURE
MICRO NUMBER:: 15556043
Result:: NO GROWTH
SPECIMEN QUALITY:: ADEQUATE

## 2023-06-03 ENCOUNTER — Encounter: Payer: Self-pay | Admitting: Internal Medicine

## 2023-06-03 ENCOUNTER — Ambulatory Visit (INDEPENDENT_AMBULATORY_CARE_PROVIDER_SITE_OTHER): Payer: Medicare Other | Admitting: Internal Medicine

## 2023-06-03 VITALS — BP 118/62 | HR 83 | Temp 96.2°F | Wt 86.0 lb

## 2023-06-03 DIAGNOSIS — J432 Centrilobular emphysema: Secondary | ICD-10-CM | POA: Diagnosis not present

## 2023-06-03 DIAGNOSIS — R918 Other nonspecific abnormal finding of lung field: Secondary | ICD-10-CM

## 2023-06-03 DIAGNOSIS — A310 Pulmonary mycobacterial infection: Secondary | ICD-10-CM | POA: Insufficient documentation

## 2023-06-03 DIAGNOSIS — I700 Atherosclerosis of aorta: Secondary | ICD-10-CM

## 2023-06-03 DIAGNOSIS — E7800 Pure hypercholesterolemia, unspecified: Secondary | ICD-10-CM

## 2023-06-03 NOTE — Assessment & Plan Note (Signed)
Continue Trelegy and albuterol Encourage smoking cessation She will continue to follow with pulmonology

## 2023-06-03 NOTE — Progress Notes (Signed)
Subjective:    Patient ID: Alejandra Ortiz, female    DOB: 1951/05/21, 72 y.o.   MRN: [id number]  HPI  Patient presents to clinic today for follow-up of chronic conditions.  COPD with lung nodules, MAI: She reports chronic cough but she denies shortness of breath.  She is using trelegy and albuterol as prescribed. She is also taking azithromycin, rifaming and ethambutol as prescribed. There are no PFTs on file.  She does continue to smoke.  She follows with pulmonology.  HLD with aortic atherosclerosis: Her last LDL was 96, triglycerides 50, 08/2022.Marland Kitchen  She is not taking any cholesterol-lowering medication but is taking aspirin.  She does not consume a low-fat diet.  Review of Systems     Past Medical History:  Diagnosis Date   Chicken pox    COPD (chronic obstructive pulmonary disease) (HCC)    Hyperlipidemia     Current Outpatient Medications  Medication Sig Dispense Refill   albuterol (PROVENTIL) (2.5 MG/3ML) 0.083% nebulizer solution Take 3 mLs (2.5 mg total) by nebulization every 6 (six) hours as needed for wheezing or shortness of breath. 75 mL 5   albuterol (VENTOLIN HFA) 108 (90 Base) MCG/ACT inhaler INHALE 2 PUFFS INTO THE LUNGS EVERY 6 HOURS AS NEEDED FOR WHEEZING OR SHORTNESS OF BREATH 54 g 0   aspirin EC 81 MG tablet Take 1 tablet (81 mg total) by mouth daily. Swallow whole. 30 tablet 12   azithromycin (ZITHROMAX) 250 MG tablet Take 1 tablet (250 mg total) by mouth daily. 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 45 tablet 2   Fluticasone-Umeclidin-Vilant (TRELEGY ELLIPTA) 200-62.5-25 MCG/ACT AEPB Inhale 1 puff into the lungs daily. 180 each 3   nitrofurantoin, macrocrystal-monohydrate, (MACROBID) 100 MG capsule Take 1 capsule (100 mg total) by mouth 2 (two) times daily. 10 capsule 0   rifampin (RIFADIN) 150 MG capsule Take 3 capsules (450 mg total) by  mouth daily. 90 capsule 2   No current facility-administered medications for this visit.    No Known Allergies  Family History  Family history unknown: Yes    Social History   Socioeconomic History   Marital status: Married    Spouse name: Raziya Aveni   Number of children: 5   Years of education: Not on file   Highest education level: Not on file  Occupational History   Occupation: Knit    Comment: [employer]  Tobacco Use   Smoking status: Every Day    Current packs/day: 0.25    Average packs/day: 0.3 packs/day for 48.0 years (12.0 ttl pk-yrs)    Types: Cigarettes   Smokeless tobacco: Never   Tobacco comments:    4-7 cigarettes daily- 05/07/2023 khj  Vaping Use   Vaping status: Never Used  Substance and Sexual Activity   Alcohol use: No    Alcohol/week: 0.0 standard drinks of alcohol   Drug use: No   Sexual activity: Never    Birth control/protection: Surgical  Other Topics Concern   Not on file  Social History Narrative   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   Lives with husband and 5 children with 12 grandchildren, 4 great-grandchildren   1 dog lives inside    Associates Degree   Right hand dominant    Enjoys reading    Social Determinants of Health   Financial Resource Strain: Low Risk  (08/28/2022)   Overall  Financial Resource Strain (CARDIA)    Difficulty of Paying Living Expenses: Not hard at all  Food Insecurity: No Food Insecurity (08/28/2022)   Hunger Vital Sign    Worried About Running Out of Food in the Last Year: Never true    Ran Out of Food in the Last Year: Never true  Transportation Needs: No Transportation Needs (08/28/2022)   PRAPARE - Administrator, Civil Service (Medical): No    Lack of Transportation (Non-Medical): No  Physical Activity: Sufficiently Active (08/28/2022)   Exercise Vital Sign    Days of Exercise per Week: 5 days    Minutes of Exercise per Session: 30 min  Stress: No Stress Concern Present (08/28/2022)    [employer] of Occupational Health - Occupational Stress Questionnaire    Feeling of Stress : Not at all  Social Connections: Moderately Isolated (08/28/2022)   Social Connection and Isolation Panel [NHANES]    Frequency of Communication with Friends and Family: Once a week    Frequency of Social Gatherings with Friends and Family: More than three times a week    Attends Religious Services: Never    Database administrator or Organizations: No    Attends Banker Meetings: Never    Marital Status: Married  Catering manager Violence: Not At Risk (08/28/2022)   Humiliation, Afraid, Rape, and Kick questionnaire    Fear of Current or Ex-Partner: No    Emotionally Abused: No    Physically Abused: No    Sexually Abused: No     Constitutional: Denies fever, malaise, fatigue, headache or abrupt weight changes.  HEENT: Denies eye pain, eye redness, ear pain, ringing in the ears, wax buildup, runny nose, nasal congestion, bloody nose, or sore throat. Respiratory: Patient reports chronic cough.  Denies difficulty breathing, shortness of breath.   Cardiovascular: Denies chest pain, chest tightness, palpitations or swelling in the hands or feet.  Gastrointestinal: Denies abdominal pain, bloating, constipation, diarrhea or blood in the stool.  GU: Denies urgency, frequency, pain with urination, burning sensation, blood in urine, odor or discharge. Musculoskeletal: Denies decrease in range of motion, difficulty with gait, muscle pain or joint pain and swelling.  Skin: Denies redness, rashes, lesions or ulcercations.  Neurological: Denies dizziness, difficulty with memory, difficulty with speech or problems with balance and coordination.  Psych: Denies anxiety, depression, SI/HI.  No other specific complaints in a complete review of systems (except as listed in HPI above).  Objective:   Physical Exam  BP 118/62 (BP Location: Left Arm, Patient Position: Sitting, Cuff Size: Normal)    Pulse 83   Temp (!) 96.2 F (35.7 C) (Temporal)   Wt 86 lb (39 kg)   SpO2 96%   BMI 16.25 kg/m   Wt Readings from Last 3 Encounters:  05/31/23 85 lb (38.6 kg)  05/07/23 91 lb 6.4 oz (41.5 kg)  04/18/23 93 lb (42.2 kg)    General: Appears her stated age, underweight, in NAD. Skin: Warm, dry and intact.  Cardiovascular: Normal rate and rhythm. S1,S2 noted.  No murmur, rubs or gallops noted. No JVD or BLE edema. No carotid bruits noted. Pulmonary/Chest: Normal effort and positive vesicular breath sounds. No respiratory distress. No wheezes, rales or ronchi noted.  Musculoskeletal: Muscle wasting noted of upper and lower extremities.  No difficulty with gait.  Neurological: Alert and oriented. Coordination normal.  Psychiatric: Mood and affect normal. Behavior is normal. Judgment and thought content normal.     BMET  Component Value Date/Time   NA 136 04/18/2023 0913   NA 137 09/25/2013 0950   K 4.2 04/18/2023 0913   K 3.6 09/25/2013 0950   CL 99 04/18/2023 0913   CL 106 09/25/2013 0950   CO2 25 04/18/2023 0913   CO2 27 09/25/2013 0950   GLUCOSE 113 (H) 04/18/2023 0913   GLUCOSE 103 (H) 09/25/2013 0950   BUN 7 (L) 04/18/2023 0913   BUN 14 09/25/2013 0950   CREATININE 0.85 04/18/2023 0913   CREATININE 0.80 08/30/2022 1103   CALCIUM 9.1 04/18/2023 0913   CALCIUM 8.9 09/25/2013 0950   GFRNONAA >60 04/18/2023 0913   GFRNONAA 57 (L) 09/25/2013 0950   GFRAA >60 09/25/2013 0950    Lipid Panel     Component Value Date/Time   CHOL 182 08/30/2022 1103   TRIG 50 08/30/2022 1103   HDL 73 08/30/2022 1103   CHOLHDL 2.5 08/30/2022 1103   VLDL 12.0 03/17/2019 1401   LDLCALC 96 08/30/2022 1103    CBC    Component Value Date/Time   WBC 7.9 04/18/2023 0913   RBC 4.35 04/18/2023 0913   HGB 12.4 04/18/2023 0913   HGB 12.3 09/25/2013 0950   HCT 40.1 04/18/2023 0913   HCT 35.4 09/25/2013 0950   PLT 287 04/18/2023 0913   PLT 183 09/25/2013 0950   MCV 92.2 04/18/2023 0913    MCV 98 09/25/2013 0950   MCH 28.5 04/18/2023 0913   MCHC 30.9 04/18/2023 0913   RDW 14.8 04/18/2023 0913   RDW 12.4 09/25/2013 0950   LYMPHSABS 1.0 04/18/2023 0913   MONOABS 0.5 04/18/2023 0913   EOSABS 0.1 04/18/2023 0913   BASOSABS 0.1 04/18/2023 0913    Hgb A1C Lab Results  Component Value Date   HGBA1C 5.4 11/17/2014            Assessment & Plan:    RTC in 6 months, follow-up chronic conditions Nicki Reaper, NP

## 2023-06-03 NOTE — Assessment & Plan Note (Signed)
 Continue antibiotics per ID.

## 2023-06-03 NOTE — Assessment & Plan Note (Signed)
Getting low-dose lung cancer screenings yearly

## 2023-06-03 NOTE — Assessment & Plan Note (Signed)
C-Met and lipid profile today Encouraged her to consume a low-fat diet

## 2023-06-03 NOTE — Assessment & Plan Note (Signed)
C-Met and lipid profile today Encouraged her to consume a low-fat diet Continue aspirin 

## 2023-06-03 NOTE — Patient Instructions (Signed)

## 2023-06-04 LAB — COMPLETE METABOLIC PANEL WITH GFR
AG Ratio: 1 (calc) (ref 1.0–2.5)
ALT: 9 U/L (ref 6–29)
AST: 13 U/L (ref 10–35)
Albumin: 3.4 g/dL — ABNORMAL LOW (ref 3.6–5.1)
Alkaline phosphatase (APISO): 78 U/L (ref 37–153)
BUN: 10 mg/dL (ref 7–25)
CO2: 27 mmol/L (ref 20–32)
Calcium: 9.4 mg/dL (ref 8.6–10.4)
Chloride: 100 mmol/L (ref 98–110)
Creat: 0.83 mg/dL (ref 0.60–1.00)
Globulin: 3.4 g/dL (ref 1.9–3.7)
Glucose, Bld: 106 mg/dL (ref 65–139)
Potassium: 4.4 mmol/L (ref 3.5–5.3)
Sodium: 137 mmol/L (ref 135–146)
Total Bilirubin: 0.5 mg/dL (ref 0.2–1.2)
Total Protein: 6.8 g/dL (ref 6.1–8.1)
eGFR: 74 mL/min/{1.73_m2} (ref >=60)

## 2023-06-04 LAB — LIPID PANEL
Cholesterol: 185 mg/dL (ref <200)
HDL: 59 mg/dL (ref >=50)
LDL Cholesterol (Calc): 107 mg/dL — ABNORMAL HIGH
Non-HDL Cholesterol (Calc): 126 mg/dL (ref <130)
Total CHOL/HDL Ratio: 3.1 (calc) (ref <5.0)
Triglycerides: 101 mg/dL (ref <150)

## 2023-06-18 ENCOUNTER — Ambulatory Visit: Payer: Medicare Other | Attending: Infectious Diseases | Admitting: Infectious Diseases

## 2023-06-18 ENCOUNTER — Encounter: Payer: Self-pay | Admitting: Infectious Diseases

## 2023-06-18 ENCOUNTER — Other Ambulatory Visit
Admission: RE | Admit: 2023-06-18 | Discharge: 2023-06-18 | Disposition: A | Discharge status: Home / Self Care | Payer: Medicare Other | Attending: Infectious Diseases | Admitting: Infectious Diseases

## 2023-06-18 VITALS — BP 124/64 | HR 75 | Temp 96.2°F | Ht 62.0 in | Wt 88.0 lb

## 2023-06-18 DIAGNOSIS — A310 Pulmonary mycobacterial infection: Secondary | ICD-10-CM | POA: Insufficient documentation

## 2023-06-18 DIAGNOSIS — Z79899 Other long term (current) drug therapy: Secondary | ICD-10-CM | POA: Insufficient documentation

## 2023-06-18 DIAGNOSIS — Z681 Body mass index (BMI) 19 or less, adult: Secondary | ICD-10-CM | POA: Insufficient documentation

## 2023-06-18 DIAGNOSIS — R636 Underweight: Secondary | ICD-10-CM | POA: Diagnosis not present

## 2023-06-18 DIAGNOSIS — J439 Emphysema, unspecified: Secondary | ICD-10-CM | POA: Diagnosis not present

## 2023-06-18 DIAGNOSIS — Z716 Tobacco abuse counseling: Secondary | ICD-10-CM | POA: Insufficient documentation

## 2023-06-18 DIAGNOSIS — F17210 Nicotine dependence, cigarettes, uncomplicated: Secondary | ICD-10-CM | POA: Insufficient documentation

## 2023-06-18 DIAGNOSIS — R319 Hematuria, unspecified: Secondary | ICD-10-CM | POA: Diagnosis not present

## 2023-06-18 LAB — CBC WITH DIFFERENTIAL/PLATELET
Abs Immature Granulocytes: 0.03 10*3/uL (ref 0.00–0.07)
Basophils Absolute: 0 10*3/uL (ref 0.0–0.1)
Basophils Relative: 1 %
Eosinophils Absolute: 0 10*3/uL (ref 0.0–0.5)
Eosinophils Relative: 1 %
HCT: 34.7 % — ABNORMAL LOW (ref 36.0–46.0)
Hemoglobin: 11.1 g/dL — ABNORMAL LOW (ref 12.0–15.0)
Immature Granulocytes: 0 %
Lymphocytes Relative: 13 %
Lymphs Abs: 0.9 10*3/uL (ref 0.7–4.0)
MCH: 29.8 pg (ref 26.0–34.0)
MCHC: 32 g/dL (ref 30.0–36.0)
MCV: 93.3 fL (ref 80.0–100.0)
Monocytes Absolute: 0.5 10*3/uL (ref 0.1–1.0)
Monocytes Relative: 7 %
Neutro Abs: 5.7 10*3/uL (ref 1.7–7.7)
Neutrophils Relative %: 78 %
Platelets: 294 10*3/uL (ref 150–400)
RBC: 3.72 MIL/uL — ABNORMAL LOW (ref 3.87–5.11)
RDW: 13.3 % (ref 11.5–15.5)
WBC: 7.2 10*3/uL (ref 4.0–10.5)
nRBC: 0 % (ref 0.0–0.2)

## 2023-06-18 LAB — COMPREHENSIVE METABOLIC PANEL
ALT: 10 U/L (ref 0–44)
AST: 12 U/L — ABNORMAL LOW (ref 15–41)
Albumin: 3.4 g/dL — ABNORMAL LOW (ref 3.5–5.0)
Alkaline Phosphatase: 66 U/L (ref 38–126)
Anion gap: 8 (ref 5–15)
BUN: 12 mg/dL (ref 8–23)
CO2: 25 mmol/L (ref 22–32)
Calcium: 9 mg/dL (ref 8.9–10.3)
Chloride: 102 mmol/L (ref 98–111)
Creatinine, Ser: 0.74 mg/dL (ref 0.44–1.00)
GFR, Estimated: >60 mL/min (ref >60)
Glucose, Bld: 102 mg/dL — ABNORMAL HIGH (ref 70–99)
Potassium: 4.3 mmol/L (ref 3.5–5.1)
Sodium: 135 mmol/L (ref 135–145)
Total Bilirubin: 0.7 mg/dL (ref 0.3–1.2)
Total Protein: 7.6 g/dL (ref 6.5–8.1)

## 2023-06-18 MED ORDER — ETHAMBUTOL HCL 400 MG PO TABS: 15.0000 mg/kg | ORAL_TABLET | Freq: Every day | ORAL | 2 refills | Status: DC

## 2023-06-18 MED ORDER — RIFAMPIN 150 MG PO CAPS: 450.0000 mg | ORAL_CAPSULE | Freq: Every day | ORAL | 2 refills | Status: DC

## 2023-06-18 MED ORDER — AZITHROMYCIN 250 MG PO TABS: 250.0000 mg | ORAL_TABLET | Freq: Every day | ORAL | 2 refills | Status: DC

## 2023-06-18 NOTE — Progress Notes (Signed)
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 She reports she had  back pain and bladder pressure and went to her PCP  on 10/4, and a urine analysis showed orange color and  blood in the urine. A culture was sent and she was prescribed macrobid- as culture came back neg she was asked to stop antibiotics-.  Despite this, she noticed an improvement in her pain after starting the antibiotic.  The patient also reports intermittent fatigue, with some days feeling fine and others where she wants to sleep all day. She has noticed a weight gain of three pounds since starting her current medication regimen, which includes azithromycin, Ethambutol, and rifampin for the treatment of her lung infection. She has been on these medications since the first week of July.  The patient also reports a decrease in coughing, particularly in the mornings. However, she still coughs up a lot in the mornings. She had a CT chest done on 8/28 and that shows waxing and waning cavitary nodules She coninues to smoke  Alejandra Ortiz is a 72 y.o. with a history of COPD/emphysema was having routine cancer screeening CT and it was showing progression of nodules and then a cavitary lesion. 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 She did one on 02/04/23  and that was positive for MAI She was started on MAC rx the 1st week of July Pt has morning cough with greenish sputum No fever  No chills    Past Medical History:  Diagnosis Date   Chicken pox    COPD (chronic obstructive pulmonary disease) (HCC)    Hyperlipidemia     Past Surgical History:  Procedure Laterality Date   TUBAL LIGATION  1978    Social History    Socioeconomic History   Marital status: Married    Spouse name: Aerionna Waithe   Number of children: 5   Years of education: Not on file   Highest education level: Not on file  Occupational History   Occupation: Knit    Comment: [employer]  Tobacco Use   Smoking status: Every Day    Current packs/day: 0.25    Average packs/day: 0.3 packs/day for 48.0 years (12.0 ttl pk-yrs)    Types: Cigarettes   Smokeless tobacco: Never   Tobacco comments:    4-7 cigarettes daily- 05/07/2023 khj  Vaping Use   Vaping status: Never Used  Substance and Sexual Activity   Alcohol use: No    Alcohol/week: 0.0 standard drinks of alcohol   Drug use: No   Sexual activity: Never    Birth control/protection: Surgical  Other Topics Concern   Not on file  Social History Narrative   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   Lives with husband and 5 children with 12 grandchildren, 4 great-grandchildren   1 dog lives inside    Associates Degree   Right hand dominant    Enjoys reading    Social Determinants of Health   Financial Resource Strain: Low Risk  (08/28/2022)   Overall Financial Resource Strain (CARDIA)    Difficulty of Paying Living Expenses: Not hard at all  Food Insecurity: No Food  Insecurity (08/28/2022)   Hunger Vital Sign    Worried About Running Out of Food in the Last Year: Never true    Ran Out of Food in the Last Year: Never true  Transportation Needs: No Transportation Needs (08/28/2022)   PRAPARE - Administrator, Civil Service (Medical): No    Lack of Transportation (Non-Medical): No  Physical Activity: Sufficiently Active (08/28/2022)   Exercise Vital Sign    Days of Exercise per Week: 5 days    Minutes of Exercise per Session: 30 min  Stress: No Stress Concern Present (08/28/2022)   [employer] of Occupational Health - Occupational Stress Questionnaire    Feeling of Stress : Not at all  Social Connections: Moderately Isolated (08/28/2022)   Social  Connection and Isolation Panel [NHANES]    Frequency of Communication with Friends and Family: Once a week    Frequency of Social Gatherings with Friends and Family: More than three times a week    Attends Religious Services: Never    Database administrator or Organizations: No    Attends Banker Meetings: Never    Marital Status: Married  Catering manager Violence: Not At Risk (08/28/2022)   Humiliation, Afraid, Rape, and Kick questionnaire    Fear of Current or Ex-Partner: No    Emotionally Abused: No    Physically Abused: No    Sexually Abused: No    Family History  Family history unknown: Yes  adopted No Known Allergies I? Current Outpatient Medications  Medication Sig Dispense Refill   albuterol (PROVENTIL) (2.5 MG/3ML) 0.083% nebulizer solution Take 3 mLs (2.5 mg total) by nebulization every 6 (six) hours as needed for wheezing or shortness of breath. 75 mL 5   albuterol (VENTOLIN HFA) 108 (90 Base) MCG/ACT inhaler INHALE 2 PUFFS INTO THE LUNGS EVERY 6 HOURS AS NEEDED FOR WHEEZING OR SHORTNESS OF BREATH 54 g 0   aspirin EC 81 MG tablet Take 1 tablet (81 mg total) by mouth daily. Swallow whole. 30 tablet 12   azithromycin (ZITHROMAX) 250 MG tablet Take 1 tablet (250 mg total) by mouth daily. 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 45 tablet 2   Fluticasone-Umeclidin-Vilant (TRELEGY ELLIPTA) 200-62.5-25 MCG/ACT AEPB Inhale 1 puff into the lungs daily. 180 each 3   rifampin (RIFADIN) 150 MG capsule Take 3 capsules (450 mg total) by mouth daily. 90 capsule 2   No current facility-administered medications for this visit.     Abtx:  Anti-infectives (From admission, onward)    None       REVIEW OF SYSTEMS:  Const: negative fever, negative chills, 3 pound weight gain Eyes: negative diplopia or visual changes, negative eye pain ENT: negative  coryza, negative sore throat Resp:  cough,  white sputum dyspnea Cards: negative for chest pain, palpitations, lower extremity edema GU: negative for frequency, dysuria and hematuria GI: Negative for abdominal pain, diarrhea, bleeding, constipation Skin: negative for rash and pruritus Heme: negative for easy bruising and gum/nose bleeding MS: negative for myalgias, arthralgias, back pain and muscle weakness Neurolo:negative for headaches, dizziness, vertigo, memory problems  Psych: negative for feelings of anxiety, depression  Endocrine: negative for thyroid, diabetes Allergy/Immunology- negative for any medication or food allergies ? Objective:  VITALS:  BP 124/64   Pulse 75   Temp (!) 96.2 F (35.7 C) (Temporal)   Ht 5\' 2"  (1.575 m)  Wt 88 lb (39.9 kg)   SpO2 97%   BMI 16.10 kg/m   PHYSICAL EXAM:  General: Alert, cooperative, no distress, frail and thin built -BMI low Head: Normocephalic, without obvious abnormality, atraumatic. Eyes: Conjunctivae clear, anicteric sclerae. Pupils are equal ENT Nares normal. No drainage or sinus tenderness. Lips, mucosa, and tongue normal. No Thrush dentures Neck: Supple, symmetrical, no adenopathy, thyroid: non tender no carotid bruit and no JVD. Back: No CVA tenderness. Lungs: b/l air entry crepts present Heart: Regular rate and rhythm, no murmur, rub or gallop. Abdomen: Soft, non-tender,not distended. Bowel sounds normal. No masses Extremities: atraumatic, no cyanosis. No edema. No clubbing Skin: No rashes or lesions. Or bruising Lymph: Cervical, supraclavicular normal. Neurologic: Grossly non-focal Pertinent Labs    Latest Ref Rng & Units 06/18/2023   12:40 PM 06/03/2023    2:35 PM 04/18/2023    9:13 AM  CMP  Glucose 70 - 99 mg/dL 147  829  562   BUN 8 - 23 mg/dL 12  10  7    Creatinine 0.44 - 1.00 mg/dL 1.30  8.65  7.84   Sodium 135 - 145 mmol/L 135  137  136   Potassium 3.5 - 5.1 mmol/L 4.3  4.4  4.2   Chloride 98 - 111  mmol/L 102  100  99   CO2 22 - 32 mmol/L 25  27  25    Calcium 8.9 - 10.3 mg/dL 9.0  9.4  9.1   Total Protein 6.5 - 8.1 g/dL 7.6  6.8  8.1   Total Bilirubin 0.3 - 1.2 mg/dL 0.7  0.5  0.6   Alkaline Phos 38 - 126 U/L 66   83   AST 15 - 41 U/L 12  13  18    ALT 0 - 44 U/L 10  9  15          Latest Ref Rng & Units 06/18/2023   12:40 PM 04/18/2023    9:13 AM 03/19/2023    9:29 AM  CBC  WBC 4.0 - 10.5 K/uL 7.2  7.9  7.3   Hemoglobin 12.0 - 15.0 g/dL 69.6  29.5  28.4   Hematocrit 36.0 - 46.0 % 34.7  40.1  39.8   Platelets 150 - 400 K/uL 294  287  343        Microbiology: 5/24 Sputum Afb positive for MAI 02/04/23 -MAI  IMAGING RESULTS:  I have personally reviewed the films ?Extensive pleuropulmonary apical scarring Nodules  Repeat 04/24/23- reviewed independently. Not much of a change   Impression/Recommendation ? ?COPD/emphysema Smoker  MAI infection of lung- Fibrotic scarring with some pulmonary nodules and emphysematous changes  On azithromycin, ethambutol  and rifampin started 1st week July Has fatigue,  Will do labs today Recent CT chest not much of a change Will repeat Sputum X 3 for afb smear and culture  Azithromycin 250mg  once a day Ethambutol 600mg  once aday ( 15mg /kg) Rifampin 450mg  once a day   She had eye examination befofe starting Rx baseline acuity and color vision Had auditory test and getting hearing aids  Discussed side effects of meds including nausea,  and if she experiences any severe side effects like  rash, diarrhea, icterus, fever she will have to inform me immediately and antibiotics may have to be changed or stopped  Underweight-  Discussed in detail how to increase calorie and protein consumption Asked her to eat frequent meals  Gave her information on protein, iron rich food  Discussed cessation of smoking  Follow  up 2 months

## 2023-06-18 NOTE — Patient Instructions (Signed)
VISIT SUMMARY:  During your visit, we discussed your ongoing treatment for a lung infection caused by Mycobacterium avium complex (MAC), your recent experience with lower back pain, and your general health. You reported variable energy levels, a slight weight gain, and a persistent morning cough. Your recent CT scan showed some improvement in your lung condition. You also mentioned that your lower back pain has resolved.  YOUR PLAN:  -MYCOBACTERIUM AVIUM COMPLEX (MAC) INFECTION: This is a type of bacterial infection that affects your lungs. We will collect three sputum samples on three different days to check for the presence of bacteria. We will discuss the results with Dr. Jayme Cloud to determine the next steps in your treatment.  -LOWER BACK PAIN: You experienced lower back pain recently, which has now resolved. Your urine was orange due to one of your medications, rifampin, but there was no evidence of infection in your urine. No further action is needed at this time.  -GENERAL HEALTH MAINTENANCE: It's important to continue taking your current medications as prescribed and to report any changes in your symptoms or side effects to the healthcare team.  INSTRUCTIONS:  Please provide three sputum samples on three different days for testing. Continue taking your current medications as prescribed. If you notice any changes in your symptoms or side effects, please report them to the healthcare team.

## 2023-06-20 ENCOUNTER — Telehealth: Payer: Self-pay

## 2023-06-20 NOTE — Telephone Encounter (Signed)
-----   Message from Lynn Ito sent at 06/20/2023  1:08 PM EDT ----- Please let her know that her blood test is fine for Liver, kidney . Was she able to collect sputums for AFB test? thx ----- Message ----- From: Interface, Lab In [place] Sent: 06/18/2023  12:49 PM EDT To: Lynn Ito, MD

## 2023-06-26 ENCOUNTER — Other Ambulatory Visit
Admission: RE | Admit: 2023-06-26 | Discharge: 2023-06-26 | Disposition: A | Discharge status: Home / Self Care | Payer: Medicare Other | Source: Ambulatory Visit | Attending: Infectious Diseases | Admitting: Infectious Diseases

## 2023-06-26 DIAGNOSIS — A310 Pulmonary mycobacterial infection: Secondary | ICD-10-CM | POA: Diagnosis present

## 2023-06-26 DIAGNOSIS — J479 Bronchiectasis, uncomplicated: Secondary | ICD-10-CM | POA: Diagnosis present

## 2023-06-26 DIAGNOSIS — R918 Other nonspecific abnormal finding of lung field: Secondary | ICD-10-CM | POA: Diagnosis present

## 2023-07-05 LAB — ACID FAST SMEAR (AFB, MYCOBACTERIA)
Acid Fast Smear: NEGATIVE
Acid Fast Smear: NEGATIVE
Acid Fast Smear: NEGATIVE

## 2023-07-18 LAB — ACID FAST ID BY PCR AND SUSCEPTIBILITIES
M Tuberculosis Complex: NEGATIVE
M avium complex: POSITIVE — AB

## 2023-07-18 LAB — ACID FAST CULTURE WITH REFLEXED SENSITIVITIES (MYCOBACTERIA): Acid Fast Culture: POSITIVE — AB

## 2023-07-24 LAB — ACID FAST ID BY PCR AND SUSCEPTIBILITIES
M Tuberculosis Complex: NEGATIVE
M avium complex: POSITIVE — AB

## 2023-07-24 LAB — MAC SUSCEPTIBILITY BROTH
Amikacin: 8
Ciprofloxacin: >8
Clarithromycin: 2
Doxycycline: 8
Linezolid: >32
Minocycline: >8
Moxifloxacin: 4
Rifabutin: 0.5
Rifampin: >4
Streptomycin: 32

## 2023-07-24 LAB — ACID FAST CULTURE WITH REFLEXED SENSITIVITIES (MYCOBACTERIA): Acid Fast Culture: POSITIVE — AB

## 2023-07-29 ENCOUNTER — Other Ambulatory Visit: Payer: Self-pay

## 2023-07-29 MED ORDER — AZITHROMYCIN 250 MG PO TABS: 250.0000 mg | ORAL_TABLET | Freq: Every day | ORAL | 2 refills | Status: DC

## 2023-07-29 MED ORDER — RIFAMPIN 150 MG PO CAPS: 450.0000 mg | ORAL_CAPSULE | Freq: Every day | ORAL | 2 refills | Status: DC

## 2023-07-29 MED ORDER — ETHAMBUTOL HCL 400 MG PO TABS: 15.0000 mg/kg | ORAL_TABLET | Freq: Every day | ORAL | 2 refills | Status: DC

## 2023-08-11 LAB — ACID FAST CULTURE WITH REFLEXED SENSITIVITIES (MYCOBACTERIA): Acid Fast Culture: NEGATIVE

## 2023-08-12 ENCOUNTER — Ambulatory Visit
Admission: RE | Admit: 2023-08-12 | Discharge: 2023-08-12 | Disposition: A | Discharge status: Home / Self Care | Payer: Medicare Other | Source: Ambulatory Visit | Attending: Pulmonary Disease | Admitting: Pulmonary Disease

## 2023-08-12 ENCOUNTER — Encounter: Payer: Self-pay | Admitting: Pulmonary Disease

## 2023-08-12 ENCOUNTER — Ambulatory Visit: Payer: Medicare Other | Admitting: Pulmonary Disease

## 2023-08-12 VITALS — BP 130/80 | HR 78 | Temp 97.3°F | Ht 62.0 in | Wt 83.6 lb

## 2023-08-12 DIAGNOSIS — R0781 Pleurodynia: Secondary | ICD-10-CM

## 2023-08-12 DIAGNOSIS — J449 Chronic obstructive pulmonary disease, unspecified: Secondary | ICD-10-CM | POA: Diagnosis not present

## 2023-08-12 DIAGNOSIS — R0602 Shortness of breath: Secondary | ICD-10-CM | POA: Insufficient documentation

## 2023-08-12 DIAGNOSIS — R918 Other nonspecific abnormal finding of lung field: Secondary | ICD-10-CM

## 2023-08-12 DIAGNOSIS — A310 Pulmonary mycobacterial infection: Secondary | ICD-10-CM

## 2023-08-12 DIAGNOSIS — J479 Bronchiectasis, uncomplicated: Secondary | ICD-10-CM

## 2023-08-12 NOTE — Patient Instructions (Addendum)
VISIT SUMMARY:  During today's visit, we discussed your persistent cough, recent cold symptoms, and left lung pain. We reviewed your ongoing treatment for Mycobacterium Avium Complex (MAC) infection and Chronic Obstructive Pulmonary Disease (COPD). We also addressed the increasing size of one of your lung nodules and the potential need for further imaging and biopsy.  YOUR PLAN:  -MYCOBACTERIUM AVIUM COMPLEX (MAC) INFECTION: MAC infection is a type of bacterial infection that affects the lungs. You are currently on antibiotics for this condition. We noted that one of your lung nodules is increasing in size, which could indicate cancer. We will order a chest CT scan to evaluate the nodule and may consider a PET CT scan based on the results. If necessary, we will evaluate the feasibility of a biopsy and discuss potential direct treatment if malignancy is confirmed.  -CHRONIC COUGH: Your persistent cough is likely due to a recent viral upper respiratory infection. We will order a chest x-ray to rule out pneumonia or other complications and will follow up with you based on the results.  -PLEURITIC CHEST PAIN: Pleuritic chest pain is pain that occurs when the tissues lining your lungs become inflamed. This could be related to your underlying lung conditions or a recent infection. We will order a chest x-ray to rule out pneumonia or other complications and will follow up with you based on the results.  -CHRONIC OBSTRUCTIVE PULMONARY DISEASE (COPD): COPD is a chronic lung disease that makes it hard to breathe. Your condition is well-managed with Trelegy, and there is no need for a refill at this time. Please continue your current Trelegy regimen and monitor for any changes in symptoms.  -GENERAL HEALTH MAINTENANCE: We discussed the importance of smoking cessation as it exacerbates your underlying conditions. Quitting smoking can significantly improve your overall health and lung  function.  INSTRUCTIONS:  We are scheduling a PET/CT. Follow up with [place] after the diagnostic results are available.

## 2023-08-12 NOTE — Progress Notes (Signed)
Subjective:    Patient ID: Alejandra Ortiz, female    DOB: Feb 20, 1951, 72 y.o.   MRN: [id number]  Patient Care Team: Lorre Munroe, NP as PCP - General (Internal Medicine) Tommie Sams, DO as Consulting Physician [facility])  Chief Complaint  Patient presents with   Follow-up    DOE. No wheezing. Cough with clear sputum.    BACKGROUND/INTERVAL:The patient is a 72 year old current smoker with significant pulmonary emphysema and MAC, who presents for follow-up on COPD and abnormal lung cancer screening scans.  She is currently maintained with Trelegy Ellipta 200, 1 puff daily and as needed albuterol.  She notes doing well with these medications.  She hardly ever has to use her rescue albuterol inhaler.  She was last seen here on 07 May 2023, this is a scheduled visit.  She has been following with ID as well for her MAC.  She has since started that and she has been tolerating the therapy.    HPI Discussed the use of AI scribe software for clinical note transcription with the patient, who gave verbal consent to proceed.  History of Present Illness   The patient, with a history of COPD and MAC infection, presents with a persistent cough and recent onset of cold symptoms, including a runny nose. She denies any color to the sputum. Despite being on a regimen of antibiotics for the MAC infection and Trelegy for COPD, the patient reports experiencing pain in the left lung after walking a distance, such as through a store. The pain is relieved by ibuprofen. She denies any fevers or chills.  This started 2 to 3 days ago.  The patient also has a history of smoking.  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 The patient is currently on therapy for the MAC infection.  She had been set for another PET/CT around May 2024 however the patient canceled it at that time and elected to wait to see what MAC therapy would do.  She is back to smoking 1  pack of cigarettes per day.    Review of Systems A 10 point review of systems was performed and it is as noted above otherwise negative.   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   Lung nodules 01/17/2023   Aortic atherosclerosis (HCC) 08/17/2021   Hyperlipidemia 02/09/2016   COPD (chronic obstructive pulmonary disease) with emphysema (HCC) 02/07/2016    Social History   Tobacco Use   Smoking status: Every Day    Current packs/day: 1.00    Average packs/day: 1 pack/day for 48.0 years (48.0 ttl pk-yrs)    Types: Cigarettes   Smokeless tobacco: Never   Tobacco comments:    1 PPD - khj 08/12/2023  Substance Use Topics   Alcohol use: No    Alcohol/week: 0.0 standard drinks of alcohol    No Known Allergies  Current Meds  Medication Sig   albuterol (PROVENTIL) (2.5 MG/3ML) 0.083% nebulizer solution Take 3 mLs (2.5 mg total) by nebulization every 6 (six) hours as needed for wheezing or shortness of breath.   albuterol (VENTOLIN HFA) 108 (90 Base) MCG/ACT inhaler INHALE 2 PUFFS INTO THE LUNGS EVERY 6 HOURS AS NEEDED FOR WHEEZING OR SHORTNESS OF BREATH   aspirin EC 81 MG tablet Take 1 tablet (81 mg total) by mouth daily. Swallow whole.   azithromycin (ZITHROMAX) 250 MG tablet Take 1  tablet (250 mg total) by mouth daily. For mycobacterium avium treatmentFor mycobacterium avium treatment   ethambutol (MYAMBUTOL) 400 MG tablet Take 1.5 tablets (600 mg total) by mouth daily.   Fluticasone-Umeclidin-Vilant (TRELEGY ELLIPTA) 200-62.5-25 MCG/ACT AEPB Inhale 1 puff into the lungs daily.   rifampin (RIFADIN) 150 MG capsule Take 3 capsules (450 mg total) by mouth daily.    Immunization History  Administered Date(s) Administered   Fluad Quad(high Dose 65+) 07/08/2020, 07/03/2022   Fluad Trivalent(High Dose 65+) 05/07/2023   Influenza Split 05/27/2014   Influenza, High Dose Seasonal PF 06/07/2017, 06/28/2018, 06/26/2019, 08/03/2021    Influenza-Unspecified 06/25/2016   PFIZER(Purple Top)SARS-COV-2 Vaccination 10/30/2019, 11/20/2019, 05/26/2020   Pneumococcal Conjugate-13 11/08/2014   Pneumococcal Polysaccharide-23 11/19/2016   Td 11/08/2014   Tdap 05/19/2018        Objective:     BP 130/80 (BP Location: Right Arm, Cuff Size: Normal)   Pulse 78   Temp (!) 97.3 F (36.3 C)   Ht 5\' 2"  (1.575 m)   Wt 83 lb 9.6 oz (37.9 kg)   SpO2 94%   BMI 15.29 kg/m   SpO2: 94 % O2 Device: None (Room air)  GENERAL: Thin well-developed woman, no acute distress, fully ambulatory, no conversational dyspnea. HEAD: Normocephalic, atraumatic.  EYES: Pupils equal, round, reactive to light.  No scleral icterus.  MOUTH: Poor dentition, oral mucosa moist.  No thrush. NECK: Supple. No thyromegaly. Trachea midline. No JVD.  No adenopathy. PULMONARY: Distant breath sounds bilaterally.  Coarse, no adventitious sounds.  No point tenderness on the chest wall. CARDIOVASCULAR: S1 and S2. Regular rate and rhythm.  No rubs, murmurs or gallops heard. ABDOMEN: Benign. MUSCULOSKELETAL: No joint deformity, no clubbing, no edema.  NEUROLOGIC: No overt focal deficit, no gait disturbance, speech is fluent. SKIN: Intact,warm,dry. PSYCH: Mood and behavior normal.  Chest x-ray was performed today and shows significant enlargement of lingular mass with other pulmonary nodules less well-defined.  Severe hyperinflation and emphysematous changes noted as well:    Assessment & Plan:     ICD-10-CM   1. Stage 3 severe COPD by GOLD classification (HCC)  J44.9     2. Lung mass  R91.8 NM PET Image Restage (PS) Skull Base to Thigh (F-18 FDG)    CANCELED: CT CHEST WO CONTRAST    3. Pleuritic chest pain  R07.81 DG Chest 2 View    NM PET Image Restage (PS) Skull Base to Thigh (F-18 FDG)    CANCELED: CT CHEST WO CONTRAST    4. SOB (shortness of breath)  R06.02 DG Chest 2 View    5. Bronchiectasis without complication (HCC)  J47.9     6.  Mycobacterium avium infection (HCC)  A31.0 NM PET Image Restage (PS) Skull Base to Thigh (F-18 FDG)     Orders Placed This Encounter  Procedures   DG Chest 2 View    Standing Status:   Future    Number of Occurrences:   1    Expected Date:   08/12/2023    Expiration Date:   09/12/2023    Reason for Exam (SYMPTOM  OR DIAGNOSIS REQUIRED):   Pleuritic CP, SOB    Preferred imaging location?:   [facility]   NM PET Image Restage (PS) Skull Base to Thigh (F-18 FDG)    Standing Status:   Future    Expected Date:   08/19/2023    Expiration Date:   08/11/2024    If indicated for the ordered procedure, I authorize the administration of a  radiopharmaceutical per Radiology protocol:   Yes    Preferred imaging location?:   [facility]   Discussion:    Mycobacterium Avium Complex (MAC) Infection On antibiotics for MAC. One nodule increasing in size while others reduced. Differential includes malignancy. Discussed potential cancer, need for further imaging, and possible biopsy without general anesthesia due to nodule's location near lung edge. Potential for direct treatment if malignancy confirmed. - Order PET CT to evaluate nodule - Evaluate feasibility of biopsy if needed - Discuss potential for direct treatment if malignancy is confirmed  Chronic Cough Persistent cough with rhinorrhea, likely secondary to recent viral upper respiratory infection. No sputum discoloration, fever, or chills. - Order chest x-ray to rule out pneumonia or other complications - Evaluate results and follow up accordingly  Pleuritic Chest Pain Left-sided pleuritic pain, possibly related to underlying lung conditions or recent infection. No fever or chills. - Order chest x-ray to rule out pneumonia or other complications - Evaluate results and follow up accordingly  Chronic Obstructive Pulmonary Disease (COPD) Well-managed on Trelegy. No need for refills. - Continue current Trelegy regimen - Monitor for  any changes in symptoms  General Health Maintenance Continues to smoke, exacerbating underlying conditions. - Encourage smoking cessation  Follow-up - Schedule chest x-ray - done - Discussed results with the patient - Schedule PET CT  - Follow up after diagnostic results are available.     Advised if symptoms do not improve or worsen, to please contact office for sooner follow up or seek emergency care.    I spent 45 minutes of dedicated to the care of this patient on the date of this encounter to include pre-visit review of records, face-to-face time with the patient discussing conditions above, post visit ordering of testing, clinical documentation with the electronic health record, making appropriate referrals as documented, and communicating necessary findings to members of the patients care team.     C. Danice Goltz, MD Advanced Bronchoscopy PCCM [facility]    *This note was generated using voice recognition software/Dragon and/or AI transcription program.  Despite best efforts to proofread, errors can occur which can change the meaning. Any transcriptional errors that result from this process are unintentional and may not be fully corrected at the time of dictation.

## 2023-08-20 ENCOUNTER — Ambulatory Visit
Admission: RE | Admit: 2023-08-20 | Discharge: 2023-08-20 | Disposition: A | Discharge status: Home / Self Care | Payer: Medicare Other | Source: Ambulatory Visit | Attending: Pulmonary Disease | Admitting: Pulmonary Disease

## 2023-08-20 DIAGNOSIS — A310 Pulmonary mycobacterial infection: Secondary | ICD-10-CM | POA: Diagnosis not present

## 2023-08-20 DIAGNOSIS — R918 Other nonspecific abnormal finding of lung field: Secondary | ICD-10-CM | POA: Diagnosis present

## 2023-08-20 DIAGNOSIS — R0781 Pleurodynia: Secondary | ICD-10-CM | POA: Diagnosis not present

## 2023-08-20 LAB — GLUCOSE, CAPILLARY: Glucose-Capillary: 87 mg/dL (ref 70–99)

## 2023-08-20 MED ORDER — FLUDEOXYGLUCOSE F - 18 (FDG) INJECTION
5.2000 | Freq: Once | INTRAVENOUS | Status: AC | PRN
  Administered 2023-08-20: 5.46 via INTRAVENOUS

## 2023-08-22 ENCOUNTER — Ambulatory Visit: Payer: Medicare Other | Attending: Infectious Diseases | Admitting: Infectious Diseases

## 2023-08-22 VITALS — BP 120/51 | HR 68 | Temp 98.0°F | Ht 62.0 in | Wt 84.0 lb

## 2023-08-22 DIAGNOSIS — J439 Emphysema, unspecified: Secondary | ICD-10-CM | POA: Diagnosis present

## 2023-08-22 DIAGNOSIS — J441 Chronic obstructive pulmonary disease with (acute) exacerbation: Secondary | ICD-10-CM

## 2023-08-22 DIAGNOSIS — A310 Pulmonary mycobacterial infection: Secondary | ICD-10-CM | POA: Insufficient documentation

## 2023-08-22 DIAGNOSIS — R634 Abnormal weight loss: Secondary | ICD-10-CM | POA: Insufficient documentation

## 2023-08-22 DIAGNOSIS — Z681 Body mass index (BMI) 19 or less, adult: Secondary | ICD-10-CM | POA: Insufficient documentation

## 2023-08-22 DIAGNOSIS — F17210 Nicotine dependence, cigarettes, uncomplicated: Secondary | ICD-10-CM | POA: Insufficient documentation

## 2023-08-22 DIAGNOSIS — R918 Other nonspecific abnormal finding of lung field: Secondary | ICD-10-CM | POA: Insufficient documentation

## 2023-08-22 DIAGNOSIS — R059 Cough, unspecified: Secondary | ICD-10-CM | POA: Diagnosis present

## 2023-08-22 NOTE — Progress Notes (Signed)
NAME: Alejandra Ortiz  DOB: 1951-06-01  MRN: [id number]  Date/Time: 08/22/2023 9:29 AM  Subjective:   ?follow up of Alejandra Ortiz is a 72 y.o. with a history of COPD/emphysema was having routine cancer screeening CT and it was showing progression of nodules and then a cavitary lesion. 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 She was started on Alejandra rx the 1st week of July She is 100% adherent- she recently did Sputum [employer] in oct and they were positive for Alejandra   She has a sharp pain in the left lung when taking a deep breath. This pain is exacerbated by walking. She denies any pain when sleeping on the affected side. She has recently undergone a PET scan and an X-ray, the results of which are pending. She continues to take her prescribed antibiotics, which include three 150mg  tablets of Rifampin, one Azithromycin, and one and a half Ethambutol, all taken simultaneously. Despite this, recent sputum samples indicate the bacteria is still present, although X-ray results suggest the infection is fading.  [name] also reports a decrease in appetite, with food not tasting right. She typically eats a bowl of cereal in the morning, and something for lunch and dinner. Despite this, she has lost nearly ten pounds since the start of her treatment. She also reports coughing mainly in the morning, which she attributes to her sinuses draining.    Past Medical History:  Diagnosis Date   Chicken pox    COPD (chronic obstructive pulmonary disease) (HCC)    Hyperlipidemia     Past Surgical History:  Procedure Laterality Date   TUBAL LIGATION  1978    Social History   Socioeconomic History   Marital status: Married    Spouse name: Dinia Zimmerle   Number of children: 5   Years of education: Not on file   Highest education level: Not on file  Occupational History   Occupation: Knit    Comment: [employer]  Tobacco Use   Smoking status:  Every Day    Current packs/day: 1.00    Average packs/day: 1 pack/day for 48.0 years (48.0 ttl pk-yrs)    Types: Cigarettes   Smokeless tobacco: Never   Tobacco comments:    1 PPD - khj 08/12/2023  Vaping Use   Vaping status: Never Used  Substance and Sexual Activity   Alcohol use: No    Alcohol/week: 0.0 standard drinks of alcohol   Drug use: No   Sexual activity: Never    Birth control/protection: Surgical  Other Topics Concern   Not on file  Social History Narrative   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   Lives with husband and 5 children with 12 grandchildren, 4 great-grandchildren   1 dog lives inside    Associates Degree   Right hand dominant    Enjoys reading    Social Drivers of Corporate investment banker Strain: Low Risk  (08/28/2022)   Overall Financial Resource Strain (CARDIA)    Difficulty of Paying Living Expenses: Not hard at all  Food Insecurity: No Food Insecurity (08/28/2022)   Hunger Vital Sign    Worried About Running Out of Food in the Last Year: Never true    Ran Out of Food in the Last Year: Never true  Transportation Needs: No Transportation Needs (08/28/2022)   PRAPARE - Administrator, Civil Service (Medical): No    Lack of Transportation (  Non-Medical): No  Physical Activity: Sufficiently Active (08/28/2022)   Exercise Vital Sign    Days of Exercise per Week: 5 days    Minutes of Exercise per Session: 30 min  Stress: No Stress Concern Present (08/28/2022)   [employer] of Occupational Health - Occupational Stress Questionnaire    Feeling of Stress : Not at all  Social Connections: Moderately Isolated (08/28/2022)   Social Connection and Isolation Panel [NHANES]    Frequency of Communication with Friends and Family: Once a week    Frequency of Social Gatherings with Friends and Family: More than three times a week    Attends Religious Services: Never    Database administrator or Organizations: No    Attends Banker  Meetings: Never    Marital Status: Married  Catering manager Violence: Not At Risk (08/28/2022)   Humiliation, Afraid, Rape, and Kick questionnaire    Fear of Current or Ex-Partner: No    Emotionally Abused: No    Physically Abused: No    Sexually Abused: No    Family History  Family history unknown: Yes  adopted No Known Allergies I? Current Outpatient Medications  Medication Sig Dispense Refill   albuterol (PROVENTIL) (2.5 MG/3ML) 0.083% nebulizer solution Take 3 mLs (2.5 mg total) by nebulization every 6 (six) hours as needed for wheezing or shortness of breath. 75 mL 5   albuterol (VENTOLIN HFA) 108 (90 Base) MCG/ACT inhaler INHALE 2 PUFFS INTO THE LUNGS EVERY 6 HOURS AS NEEDED FOR WHEEZING OR SHORTNESS OF BREATH 54 g 0   aspirin EC 81 MG tablet Take 1 tablet (81 mg total) by mouth daily. Swallow whole. 30 tablet 12   azithromycin (ZITHROMAX) 250 MG tablet Take 1 tablet (250 mg total) by mouth daily. 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 45 tablet 2   Fluticasone-Umeclidin-Vilant (TRELEGY ELLIPTA) 200-62.5-25 MCG/ACT AEPB Inhale 1 puff into the lungs daily. 180 each 3   rifampin (RIFADIN) 150 MG capsule Take 3 capsules (450 mg total) by mouth daily. 90 capsule 2   No current facility-administered medications for this visit.     Abtx:  Anti-infectives (From admission, onward)    None       REVIEW OF SYSTEMS:  Const: negative fever, negative chills, weight loss of 10 pounds since starting Alejandra in July 2024 Eyes: negative diplopia or visual changes, negative eye pain ENT: negative coryza, negative sore throat Resp:  cough,  white sputum dyspnea Cards: negative for chest pain, palpitations, lower extremity edema GU: negative for frequency, dysuria and hematuria GI: poor appetite Negative for abdominal pain, diarrhea, bleeding, constipation Skin:  negative for rash and pruritus Heme: negative for easy bruising and gum/nose bleeding MS: negative for myalgias, arthralgias, back pain and muscle weakness Neurolo:negative for headaches, dizziness, vertigo, memory problems  Psych: negative for feelings of anxiety, depression  Endocrine: negative for thyroid, diabetes Allergy/Immunology- negative for any medication or food allergies ? Objective:  VITALS:  BP (!) 120/51   Pulse 68   Temp 98 F (36.7 C) (Temporal)   Ht 5\' 2"  (1.575 m)   Wt 84 lb (38.1 kg)   SpO2 98%   BMI 15.36 kg/m   PHYSICAL EXAM:  General: Alert, cooperative, no distress, frail and thin built -BMI low Head: Normocephalic, without obvious abnormality, atraumatic. Eyes: Conjunctivae clear, anicteric sclerae. Pupils are equal ENT Nares normal. No drainage or  sinus tenderness. Lips, mucosa, and tongue normal. No Thrush dentures Neck: Supple, symmetrical, no adenopathy, thyroid: non tender no carotid bruit and no JVD. Back: No CVA tenderness. Lungs: b/l air entry crepts present Heart: Regular rate and rhythm, no murmur, rub or gallop. Ortiz: Soft, non-tender,not distended. Bowel sounds normal. No masses Extremities: atraumatic, no cyanosis. No edema. No clubbing Skin: No rashes or lesions. Or bruising Lymph: Cervical, supraclavicular normal. Neurologic: Grossly non-focal Pertinent Labs    Latest Ref Rng & Units 06/18/2023   12:40 PM 06/03/2023    2:35 PM 04/18/2023    9:13 AM  CMP  Glucose 70 - 99 mg/dL 147  829  562   BUN 8 - 23 mg/dL 12  10  7    Creatinine 0.44 - 1.00 mg/dL 1.30  8.65  7.84   Sodium 135 - 145 mmol/L 135  137  136   Potassium 3.5 - 5.1 mmol/L 4.3  4.4  4.2   Chloride 98 - 111 mmol/L 102  100  99   CO2 22 - 32 mmol/L 25  27  25    Calcium 8.9 - 10.3 mg/dL 9.0  9.4  9.1   Total Protein 6.5 - 8.1 g/dL 7.6  6.8  8.1   Total Bilirubin 0.3 - 1.2 mg/dL 0.7  0.5  0.6   Alkaline Phos 38 - 126 U/L 66   83   AST 15 - 41 U/L 12  13  18    ALT 0  - 44 U/L 10  9  15          Latest Ref Rng & Units 06/18/2023   12:40 PM 04/18/2023    9:13 AM 03/19/2023    9:29 AM  CBC  WBC 4.0 - 10.5 K/uL 7.2  7.9  7.3   Hemoglobin 12.0 - 15.0 g/dL 69.6  29.5  28.4   Hematocrit 36.0 - 46.0 % 34.7  40.1  39.8   Platelets 150 - 400 K/uL 294  287  343        Microbiology: 5/24 Sputum [employer] positive for MAI 02/04/23 -MAI  IMAGING RESULTS:  I have personally reviewed the films ?Extensive pleuropulmonary apical scarring Nodules  Repeat 04/24/23- reviewed independently. Not much of a change  CXR 12/14 Left lungular mass  Impression/Recommendation ? ? Mycobacterium avium complex (Alejandra) infection Patient is on appropriate triple therapy (Rifampin 150mg  x3, Azithromycin 250mg  Ethambutol 600mg ). Sputum still positive for Alejandra,Sharp pain in left lung with deep breaths. -Continue current antibiotic regimen. -Discuss results of recent PET scan with Dr. Jayme Cloud to determine next steps. She had eye examination before starting Rx baseline acuity and color vision Had auditory test and getting hearing aids   Unexplained weight loss Patient has lost nearly 10 pounds since the start of treatment. Appetite is poor, and food does not taste right. Possible side effect of antibiotics. -Discuss with Dr. Jayme Cloud potential strategies to improve appetite and manage side effects of antibiotics.  Left lung mass New finding on imaging, causing sharp pain with deep breaths. PET scan results pending. -Discuss results of PET scan with Dr. Jayme Cloud to determine if mass is infection, cancer, or other pathology.  COPD  Tobacco use Patient continues to smoke about a pack a day. Has increased since she stopped working -Encourage smoking cessation.   Follow-up Pending discussion with Dr. Jayme Cloud and results of PET scan.

## 2023-08-22 NOTE — Patient Instructions (Signed)
During your visit, we discussed the sharp pain in your left lung, your ongoing treatment for mycobacterium avium complex (MAC) infection, your recent weight loss, and a new finding on your imaging results. We also talked about your smoking habits and the importance of quitting smoking.  YOUR PLAN:  -MYCOBACTERIUM AVIUM COMPLEX (MAC) INFECTION: MAC infection is a type of bacterial infection that affects the lungs. You are currently on a triple antibiotic therapy, and while your sputum samples still show the presence of the bacteria, your X-ray results suggest improvement. Please continue your current antibiotic regimen, and we will discuss the results of your recent PET scan with Dr. Jayme Cloud to determine the next steps.  -UNEXPLAINED WEIGHT LOSS: You have lost nearly 10 pounds since starting your treatment, and your appetite has decreased, possibly due to the antibiotics. We will discuss potential strategies to improve your appetite and manage the side effects of the antibiotics with Dr. Jayme Cloud.  -LEFT LUNG MASS: A new mass has been found in your left lung, which is causing sharp pain when you take deep breaths. We are waiting for the results of your PET scan to determine if the mass is due to infection, cancer, or another cause. We will discuss these results with Dr. Jayme Cloud to decide on the appropriate course of action.  -TOBACCO USE: You continue to smoke about a pack a day. Smoking can worsen your lung condition and overall health. We strongly encourage you to quit smoking and can provide resources to help you with this.  INSTRUCTIONS:  We will follow up with you after discussing the results of your PET scan with Dr. Jayme Cloud. Please continue your current medications and reach out if you experience any new or worsening symptoms.

## 2023-08-30 ENCOUNTER — Ambulatory Visit (INDEPENDENT_AMBULATORY_CARE_PROVIDER_SITE_OTHER): Payer: Medicare Other

## 2023-08-30 VITALS — BP 110/60 | Ht 62.0 in | Wt 85.6 lb

## 2023-08-30 DIAGNOSIS — Z0000 Encounter for general adult medical examination without abnormal findings: Secondary | ICD-10-CM | POA: Diagnosis not present

## 2023-08-30 NOTE — Patient Instructions (Addendum)
 Alejandra Ortiz , Thank you for taking time to come for your Medicare Wellness Visit. I appreciate your ongoing commitment to your health goals. Please review the following plan we discussed and let me know if I can assist you in the future.   Referrals/Orders/Follow-Ups/Clinician Recommendations: NONE  This is a list of the screening recommended for you and due dates:  Health Maintenance  Topic Date Due   Colon Cancer Screening  Never done   Mammogram  Never done   Zoster (Shingles) Vaccine (1 of 2) Never done   DEXA scan (bone density measurement)  Never done   COVID-19 Vaccine (4 - 2024-25 season) 04/28/2023   Screening for Lung Cancer  04/23/2024   Medicare Annual Wellness Visit  08/29/2024   DTaP/Tdap/Td vaccine (3 - Td or Tdap) 05/19/2028   Pneumonia Vaccine  Completed   Flu Shot  Completed   Hepatitis C Screening  Completed   HPV Vaccine  Aged Out    Advanced directives: (ACP Link)Information on Advanced Care Planning can be found at [place]  Secretary of State Advance Health Care Directives Advance Health Care Directives ([email or web address]   Next Medicare Annual Wellness Visit scheduled for next year: Yes   09/04/24 @ 10:50 AM IN PERSON

## 2023-08-30 NOTE — Progress Notes (Signed)
 Subjective:   FANI ROTONDO is a 73 y.o. female who presents for Medicare Annual (Subsequent) preventive examination.  Visit Complete: In person  Cardiac Risk Factors include: advanced age (>7men, >7 women);dyslipidemia;sedentary lifestyle;smoking/ tobacco exposure     Objective:    Today's Vitals   08/30/23 1021 08/30/23 1026  BP: 110/60   Weight: 85 lb 9.6 oz (38.8 kg)   Height: 5' 2 (1.575 m)   PainSc:  0-No pain   Body mass index is 15.66 kg/m.     08/30/2023   10:31 AM 05/19/2018    5:33 PM 10/20/2015    4:45 PM  Advanced Directives  Does Patient Have a Medical Advance Directive? No No No  Would patient like information on creating a medical advance directive? No - Patient declined  No - patient declined information    Current Medications (verified) Outpatient Encounter Medications as of 08/30/2023  Medication Sig   albuterol  (PROVENTIL ) (2.5 MG/3ML) 0.083% nebulizer solution Take 3 mLs (2.5 mg total) by nebulization every 6 (six) hours as needed for wheezing or shortness of breath.   albuterol  (VENTOLIN  HFA) 108 (90 Base) MCG/ACT inhaler INHALE 2 PUFFS INTO THE LUNGS EVERY 6 HOURS AS NEEDED FOR WHEEZING OR SHORTNESS OF BREATH   aspirin  EC 81 MG tablet Take 1 tablet (81 mg total) by mouth daily. Swallow whole.   azithromycin  (ZITHROMAX ) 250 MG tablet Take 1 tablet (250 mg total) by mouth daily. For mycobacterium avium treatmentFor mycobacterium avium treatment   ethambutol  (MYAMBUTOL ) 400 MG tablet Take 1.5 tablets (600 mg total) by mouth daily.   Fluticasone -Umeclidin-Vilant (TRELEGY ELLIPTA ) 200-62.5-25 MCG/ACT AEPB Inhale 1 puff into the lungs daily.   rifampin  (RIFADIN ) 150 MG capsule Take 3 capsules (450 mg total) by mouth daily.   No facility-administered encounter medications on file as of 08/30/2023.    Allergies (verified) Patient has no known allergies.   History: Past Medical History:  Diagnosis Date   Chicken pox    COPD (chronic obstructive  pulmonary disease) (HCC)    Hyperlipidemia    Past Surgical History:  Procedure Laterality Date   TUBAL LIGATION  1978   Family History  Family history unknown: Yes   Social History   Socioeconomic History   Marital status: Married    Spouse name: Annalee Meyerhoff   Number of children: 5   Years of education: Not on file   Highest education level: Not on file  Occupational History   Occupation: Knit    Comment: [employer]  Tobacco Use   Smoking status: Every Day    Current packs/day: 1.00    Average packs/day: 1 pack/day for 48.0 years (48.0 ttl pk-yrs)    Types: Cigarettes   Smokeless tobacco: Never   Tobacco comments:    1 PPD - khj 08/12/2023  Vaping Use   Vaping status: Never Used  Substance and Sexual Activity   Alcohol use: No    Alcohol/week: 0.0 standard drinks of alcohol   Drug use: No   Sexual activity: Never    Birth control/protection: Surgical  Other Topics Concern   Not on file  Social History Narrative   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   Lives with husband and 5 children with 12 grandchildren, 4 great-grandchildren   1 dog lives inside    Associates Degree   Right hand dominant    Enjoys reading    Social Drivers of Health   Financial Resource Strain: Low Risk  (08/30/2023)   Overall Financial  Resource Strain (CARDIA)    Difficulty of Paying Living Expenses: Not hard at all  Food Insecurity: No Food Insecurity (08/30/2023)   Hunger Vital Sign    Worried About Running Out of Food in the Last Year: Never true    Ran Out of Food in the Last Year: Never true  Transportation Needs: No Transportation Needs (08/30/2023)   PRAPARE - Administrator, Civil Service (Medical): No    Lack of Transportation (Non-Medical): No  Physical Activity: Insufficiently Active (08/30/2023)   Exercise Vital Sign    Days of Exercise per Week: 3 days    Minutes of Exercise per Session: 30 min  Stress: No Stress Concern Present (08/30/2023)   [employer] - Occupational Stress Questionnaire    Feeling of Stress : Not at all  Social Connections: Moderately Isolated (08/30/2023)   Social Connection and Isolation Panel [NHANES]    Frequency of Communication with Friends and Family: Once a week    Frequency of Social Gatherings with Friends and Family: More than three times a week    Attends Religious Services: Never    Database Administrator or Organizations: No    Attends Engineer, Structural: Never    Marital Status: Married    Tobacco Counseling Ready to quit: Not Answered Counseling given: Not Answered Tobacco comments: 1 PPD - khj 08/12/2023   Clinical Intake:  Pre-visit preparation completed: Yes  Pain : No/denies pain Pain Score: 0-No pain     BMI - recorded: 15.66 Nutritional Status: BMI <19  Underweight Nutritional Risks: None Diabetes: No  How often do you need to have someone help you when you read instructions, pamphlets, or other written materials from your doctor or pharmacy?: 1 - Never  Interpreter Needed?: No  Information entered by :: JHONNIE DAS, LPN   Activities of Daily Living    08/30/2023   10:32 AM 05/31/2023    3:50 PM  In your present state of health, do you have any difficulty performing the following activities:  Hearing? 1 0  Vision? 0 0  Difficulty concentrating or making decisions? 0 0  Walking or climbing stairs? 0 0  Dressing or bathing? 0 0  Doing errands, shopping? 0 0  Preparing Food and eating ? N   Using the Toilet? N   In the past six months, have you accidently leaked urine? N   Do you have problems with loss of bowel control? N   Managing your Medications? N   Managing your Finances? N   Housekeeping or managing your Housekeeping? N     Patient Care Team: Antonette Angeline ORN, NP as PCP - General (Internal Medicine) Cook, Jayce G, DO as Consulting Physician (Family Medicine) Pa, [facility] (Optometry)  Indicate any recent  Medical Services you may have received from other than [facility] providers in the past year (date may be approximate).     Assessment:   This is a routine wellness examination for Reka.  Hearing/Vision screen Hearing Screening - Comments:: HAS THEM BUT DOESN'T WEAR Vision Screening - Comments:: WEARS GLASSES ALL THE TIME- [facility]   Goals Addressed             This Visit's Progress    DIET - EAT MORE FRUITS AND VEGETABLES         Depression Screen    08/30/2023   10:29 AM 08/22/2023    8:56 AM 06/18/2023   11:43 AM 05/31/2023  3:50 PM 03/19/2023    8:47 AM 02/26/2023    8:52 AM 08/28/2022   10:24 AM  PHQ 2/9 Scores  PHQ - 2 Score 0 0 0 0 0 0 0  PHQ- 9 Score 0          Fall Risk    08/30/2023   10:32 AM 08/22/2023    8:56 AM 06/18/2023   11:43 AM 05/31/2023    3:50 PM 03/19/2023    8:47 AM  Fall Risk   Falls in the past year? 0 1 0 0 0  Number falls in past yr: 0 0     Injury with Fall? 0 0  0   Risk for fall due to : No Fall Risks No Fall Risks No Fall Risks No Fall Risks No Fall Risks  Follow up Falls prevention discussed;Falls evaluation completed Falls evaluation completed Falls evaluation completed  Falls evaluation completed    MEDICARE RISK AT HOME: Medicare Risk at Home Any stairs in or around the home?: No If so, are there any without handrails?: No Home free of loose throw rugs in walkways, pet beds, electrical cords, etc?: Yes Adequate lighting in your home to reduce risk of falls?: Yes Life alert?: No Use of a cane, walker or w/c?: No Grab bars in the bathroom?: Yes Shower chair or bench in shower?: Yes Elevated toilet seat or a handicapped toilet?: Yes  TIMED UP AND GO:  Was the test performed?  Yes  Length of time to ambulate 10 feet: 4 sec Gait steady and fast without use of assistive device    Cognitive Function:        08/30/2023   10:33 AM 08/28/2022   10:26 AM  6CIT Screen  What Year? 0 points 0 points  What month? 0 points 0 points   What time? 0 points 0 points  Count back from 20 0 points 0 points  Months in reverse 2 points 0 points  Repeat phrase 0 points 0 points  Total Score 2 points 0 points    Immunizations Immunization History  Administered Date(s) Administered   Fluad Quad(high Dose 65+) 07/08/2020, 07/03/2022   Fluad Trivalent(High Dose 65+) 05/07/2023   Influenza Split 05/27/2014   Influenza, High Dose Seasonal PF 06/07/2017, 06/28/2018, 06/26/2019, 08/03/2021   Influenza-Unspecified 06/25/2016   PFIZER(Purple Top)SARS-COV-2 Vaccination 10/30/2019, 11/20/2019, 05/26/2020   Pneumococcal Conjugate-13 11/08/2014   Pneumococcal Polysaccharide-23 11/19/2016   Td 11/08/2014   Tdap 05/19/2018    TDAP status: Up to date  Flu Vaccine status: Up to date  Pneumococcal vaccine status: Up to date  Covid-19 vaccine status: Completed vaccines  Qualifies for Shingles Vaccine? Yes   Zostavax completed No   Shingrix Completed?: No.    Education has been provided regarding the importance of this vaccine. Patient has been advised to call insurance company to determine out of pocket expense if they have not yet received this vaccine. Advised may also receive vaccine at local pharmacy or Health Dept. Verbalized acceptance and understanding.  Screening Tests Health Maintenance  Topic Date Due   Colonoscopy  Never done   MAMMOGRAM  Never done   Zoster Vaccines- Shingrix (1 of 2) Never done   DEXA SCAN  Never done   COVID-19 Vaccine (4 - 2024-25 season) 04/28/2023   Lung Cancer Screening  04/23/2024   Medicare Annual Wellness (AWV)  08/29/2024   DTaP/Tdap/Td (3 - Td or Tdap) 05/19/2028   Pneumonia Vaccine 88+ Years old  Completed   INFLUENZA VACCINE  Completed   Hepatitis C Screening  Completed   HPV VACCINES  Aged Out    Health Maintenance  Health Maintenance Due  Topic Date Due   Colonoscopy  Never done   MAMMOGRAM  Never done   Zoster Vaccines- Shingrix (1 of 2) Never done   DEXA SCAN  Never done    COVID-19 Vaccine (4 - 2024-25 season) 04/28/2023    DECLINED REFERRAL FOR COLONOSCOPY  DECLINED REFERRAL FOR MAMMOGRAM  DECLINED REFERRAL FOR BDS  Lung Cancer Screening: (Low Dose CT Chest recommended if Age 47-80 years, 20 pack-year currently smoking OR have quit w/in 15years.) does qualify.   Lung Cancer Screening Referral: CT SCAN DONE 04/24/23  Additional Screening:  Hepatitis C Screening: does qualify; Completed 01/31/23  Vision Screening: Recommended annual ophthalmology exams for early detection of glaucoma and other disorders of the eye. Is the patient up to date with their annual eye exam?  Yes  Who is the provider or what is the name of the office in which the patient attends annual eye exams? [facility] If pt is not established with a provider, would they like to be referred to a provider to establish care? No .   Dental Screening: Recommended annual dental exams for proper oral hygiene   Community Resource Referral / Chronic Care Management: CRR required this visit?  No   CCM required this visit?  No     Plan:     I have personally reviewed and noted the following in the patient's chart:   Medical and social history Use of alcohol, tobacco or illicit drugs  Current medications and supplements including opioid prescriptions. Patient is not currently taking opioid prescriptions. Functional ability and status Nutritional status Physical activity Advanced directives List of other physicians Hospitalizations, surgeries, and ER visits in previous 12 months Vitals Screenings to include cognitive, depression, and falls Referrals and appointments  In addition, I have reviewed and discussed with patient certain preventive protocols, quality metrics, and best practice recommendations. A written personalized care plan for preventive services as well as general preventive health recommendations were provided to patient.     Jhonnie GORMAN Das, LPN   04/01/7973    After Visit Summary: (In Person-Declined) Patient declined AVS at this time.- ON [facility]  Nurse Notes: NONE

## 2023-09-20 ENCOUNTER — Ambulatory Visit (INDEPENDENT_AMBULATORY_CARE_PROVIDER_SITE_OTHER): Payer: Medicare Other | Admitting: Pulmonary Disease

## 2023-09-20 ENCOUNTER — Encounter: Payer: Self-pay | Admitting: Pulmonary Disease

## 2023-09-20 VITALS — BP 110/60 | HR 79 | Temp 97.3°F | Ht 62.0 in | Wt 86.8 lb

## 2023-09-20 DIAGNOSIS — F17210 Nicotine dependence, cigarettes, uncomplicated: Secondary | ICD-10-CM

## 2023-09-20 DIAGNOSIS — A310 Pulmonary mycobacterial infection: Secondary | ICD-10-CM | POA: Diagnosis not present

## 2023-09-20 DIAGNOSIS — J449 Chronic obstructive pulmonary disease, unspecified: Secondary | ICD-10-CM | POA: Diagnosis not present

## 2023-09-20 DIAGNOSIS — R918 Other nonspecific abnormal finding of lung field: Secondary | ICD-10-CM

## 2023-09-20 NOTE — Progress Notes (Signed)
Subjective:    Patient ID: Alejandra Ortiz, female    DOB: [birth year], 73 y.o.   MRN: [id number]  Patient Care Team: Lorre Munroe, NP as PCP - General (Internal Medicine) Tommie Sams, DO as Consulting Physician (Family Medicine) Pa, [facility])  Chief Complaint  Patient presents with   Follow-up    DOE. No wheezing. Cough with clear sputum.     BACKGROUND/INTERVAL:The patient is a 73 year old current smoker with significant pulmonary emphysema and MAC, who presents for follow-up on COPD and abnormal lung cancer screening scans.  She is currently maintained with Trelegy Ellipta 200, 1 puff daily and as needed albuterol. She was last seen here on 16th December 2024, this is a scheduled visit.  She has been following with ID as well for her MAC.  She is on MAC therapy and she has been tolerating this well.  At the prior visit she was noted to have an enlarging lesion on the left lower lobe patient has had a PET/CT she is here to discuss those findings.  HPI Discussed the use of AI scribe software for clinical note transcription with the patient, who gave verbal consent to proceed.  History of Present Illness   The patient, with a history of severe COPD, MAC infection, and a lung mass, continues to smoke one pack of cigarettes per day. She reports her breathing as "all right." A recent PET scan revealed an area of concern in the lung, which is highly active and suspicious for potential malignancy. The patient's breathing is managed with Trelegy, and she reports no significant issues with this treatment.  She is on treatment with triple antibiotics for her MAC infection and she is tolerating these.  She has an area on the PET/CT that suggest potential issue in her colon however she states that she cannot tolerate bowel preps for colonoscopies due to intractable nausea with prep medications.  The patient's daughter accompanies her today.  All questions asked by the  patient and her daughter where answered to their satisfaction.  I showed them all of the films that the patient has recently had particularly the PET/CT.  The patient also discusses her struggle with smoking cessation, having previously used Nicorette gum and mints. She expresses difficulty finding Nicorette lozenges, which she prefers due to having false teeth.      Review of Systems A 10 point review of systems was performed and it is as noted above otherwise negative.   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   Lung nodules 01/17/2023   Aortic atherosclerosis (HCC) 08/17/2021   Hyperlipidemia 02/09/2016   COPD (chronic obstructive pulmonary disease) with emphysema (HCC) 02/07/2016    Social History   Tobacco Use   Smoking status: Every Day    Current packs/day: 1.00    Average packs/day: 1 pack/day for 48.0 years (48.0 ttl pk-yrs)    Types: Cigarettes   Smokeless tobacco: Never   Tobacco comments:    1 PPD - khj 09/20/2023  Substance Use Topics   Alcohol use: No    Alcohol/week: 0.0 standard drinks of alcohol    No Known Allergies  Current Meds  Medication Sig   albuterol (PROVENTIL) (2.5 MG/3ML) 0.083% nebulizer solution Take 3 mLs (2.5 mg total) by nebulization every 6 (six) hours as needed for wheezing or shortness of breath.   albuterol (VENTOLIN HFA) 108 (90 Base) MCG/ACT inhaler INHALE 2 PUFFS INTO THE LUNGS  EVERY 6 HOURS AS NEEDED FOR WHEEZING OR SHORTNESS OF BREATH   aspirin EC 81 MG tablet Take 1 tablet (81 mg total) by mouth daily. Swallow whole.   azithromycin (ZITHROMAX) 250 MG tablet Take 1 tablet (250 mg total) by mouth daily. For mycobacterium avium treatmentFor mycobacterium avium treatment   ethambutol (MYAMBUTOL) 400 MG tablet Take 1.5 tablets (600 mg total) by mouth daily.   Fluticasone-Umeclidin-Vilant (TRELEGY ELLIPTA) 200-62.5-25 MCG/ACT AEPB Inhale 1 puff into the lungs daily.   rifampin  (RIFADIN) 150 MG capsule Take 3 capsules (450 mg total) by mouth daily.    Immunization History  Administered Date(s) Administered   Fluad Quad(high Dose 65+) 07/08/2020, 07/03/2022   Fluad Trivalent(High Dose 65+) 05/07/2023   Influenza Split 05/27/2014   Influenza, High Dose Seasonal PF 06/07/2017, 06/28/2018, 06/26/2019, 08/03/2021   Influenza-Unspecified 06/25/2016   PFIZER(Purple Top)SARS-COV-2 Vaccination 10/30/2019, 11/20/2019, 05/26/2020   Pneumococcal Conjugate-13 11/08/2014   Pneumococcal Polysaccharide-23 11/19/2016   Td 11/08/2014   Tdap 05/19/2018        Objective:     BP 110/60   Pulse 79   Temp (!) 97.3 F (36.3 C)   Ht 5\' 2"  (1.575 m)   Wt 86 lb 12.8 oz (39.4 kg)   SpO2 98%   BMI 15.88 kg/m   SpO2: 98 % O2 Device: None (Room air)  GENERAL: Thin well-developed woman, no acute distress, fully ambulatory, no conversational dyspnea. HEAD: Normocephalic, atraumatic.  EYES: Pupils equal, round, reactive to light.  No scleral icterus.  MOUTH: Poor dentition, oral mucosa moist.  No thrush. NECK: Supple. No thyromegaly. Trachea midline. No JVD.  No adenopathy. PULMONARY: Distant breath sounds bilaterally.  Coarse, no adventitious sounds. CARDIOVASCULAR: S1 and S2. Regular rate and rhythm.  No rubs, murmurs or gallops heard. ABDOMEN: Benign. MUSCULOSKELETAL: No joint deformity, no clubbing, no edema.  NEUROLOGIC: No overt focal deficit, no gait disturbance, speech is fluent. SKIN: Intact,warm,dry. PSYCH: Mood and behavior normal.  Representative image from PET/CT performed 20 August 2023, independently reviewed, and films showed to the patient.  This shows hypermetabolic mass on the left lower lobe (arrow):     Assessment & Plan:     ICD-10-CM   1. Lung mass  R91.8 IR Radiologist Eval & Mgmt    2. Stage 3 severe COPD by GOLD classification (HCC)  J44.9     3. Mycobacterium avium infection (HCC)  A31.0     4. 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  She has a 4 cm left lower lobe mass, she is not a candidate for general anesthesia.  Query percutaneous needle biopsy feasibility.  Would need specimens for both cytology and culture given patient has MAC currently on therapy.  Appreciate input in advance.    Standing Status:   Future    Expiration Date:   09/19/2024    Reason for Exam (SYMPTOM  OR DIAGNOSIS REQUIRED):   LEFT lung mass    Preferred Imaging Location?:    [facility]   Discussion:    Lung Mass Suspicious lung mass identified on PET scan with high activity, concerning for potential malignancy versus MAC infection. Emphasized the need for tissue diagnosis to guide treatment. Discussed biopsy risks, including potential lung collapse, but noted minimized risk due to mass location. Alternative radiation treatment  without biopsy was discussed, but knowing the exact nature of the mass was emphasized. Needle biopsy is safer and can be done without general anesthesia, reducing the risk of prolonged ventilator dependence. - Arrange needle biopsy with interventional radiology - Coordinate follow-up for biopsy results  Severe COPD Chronic obstructive pulmonary disease with ongoing symptoms, managed with Trelegy. No wheezing noted on today's exam. Discussed smoking cessation strategies including Nicorette lozenges and ZYN pouches. 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 Areas of infection appear to be responding to current treatment. - Continue current MAC treatment  General Health Maintenance Discussed the need for colon cancer screening. Patient has difficulty with traditional colonoscopy prep due to intolerance of  prep solutions.  Patient has Cologuard test as an alternative. - Recommend patient discuss with primary care alternatives for evaluation  Follow-up - Schedule follow-up appointment in 4-6 weeks - Call patient with biopsy results when available.     Advised if symptoms do not improve or worsen, to please contact office for sooner follow up or seek emergency care.    I spent 40 minutes of dedicated to the care of this patient on the date of this encounter to include pre-visit review of records, face-to-face time with the patient discussing conditions above, post visit ordering of testing, clinical documentation with the electronic health record, making appropriate referrals as documented, and communicating necessary findings to members of the patients care team.     C. Danice Goltz, MD Advanced Bronchoscopy PCCM [facility]    *This note was generated using voice recognition software/Dragon and/or AI transcription program.  Despite best efforts to proofread, errors can occur which can change the meaning. Any transcriptional errors that result from this process are unintentional and may not be fully corrected at the time of dictation.

## 2023-09-20 NOTE — Patient Instructions (Signed)
VISIT SUMMARY:  During today's visit, we discussed your ongoing health concerns, including the suspicious lung mass, severe COPD, MAC infection, and general health maintenance. We reviewed your recent PET scan results and addressed your difficulties with smoking cessation and bowel movements.  YOUR PLAN:  -LUNG MASS: A suspicious lung mass was identified on your recent PET scan, which could potentially be cancerous or related to your MAC infection. We need to perform a needle biopsy to determine the exact nature of the mass. This procedure is safer and can be done without general anesthesia. We will arrange this biopsy with the radiology department and follow up with you once we have the results.  -SEVERE COPD: Chronic obstructive pulmonary disease (COPD) is a long-term lung condition that makes it hard to breathe. Your symptoms are currently managed with Trelegy, and there were no signs of wheezing today. We discussed the importance of quitting smoking and suggested using Nicorette lozenges or Zyn pouches to help you stop. Please continue using Trelegy as prescribed.  -MAC INFECTION: Mycobacterium avium complex (MAC) infection is a type of bacterial infection that affects the lungs. Your current treatment seems to be working well, and we will continue with the same treatment plan.  -GENERAL HEALTH MAINTENANCE: We discussed the importance of colon cancer screening. Since you have difficulty with traditional colonoscopy prep, we recommend using the Cologuard test as an alternative. This test is easier to complete and does not require the same preparation as a colonoscopy.  INSTRUCTIONS:  Please schedule a follow-up appointment in 4-6 weeks. We will call you with the biopsy results as soon as they are available.

## 2023-09-23 ENCOUNTER — Other Ambulatory Visit: Payer: Self-pay | Admitting: Pulmonary Disease

## 2023-09-23 DIAGNOSIS — J449 Chronic obstructive pulmonary disease, unspecified: Secondary | ICD-10-CM

## 2023-09-23 DIAGNOSIS — A310 Pulmonary mycobacterial infection: Secondary | ICD-10-CM

## 2023-09-23 DIAGNOSIS — R918 Other nonspecific abnormal finding of lung field: Secondary | ICD-10-CM

## 2023-09-23 NOTE — Progress Notes (Signed)
Interventional radiology (Dr. Fredia Sorrow) has reviewed patient's films.  Patient is a high risk for biopsy due to cavitary nature of the lesion in question.  Dr. Fredia Sorrow recommends repeating chest CT, to assess if this is related to MAC.  CT without contrast ordered.  Gailen Shelter, MD Advanced Bronchoscopy PCCM [facility]

## 2023-09-26 ENCOUNTER — Ambulatory Visit
Admission: RE | Admit: 2023-09-26 | Discharge: 2023-09-26 | Disposition: A | Discharge status: Home / Self Care | Payer: Medicare Other | Source: Ambulatory Visit | Attending: Pulmonary Disease | Admitting: Pulmonary Disease

## 2023-09-26 DIAGNOSIS — R918 Other nonspecific abnormal finding of lung field: Secondary | ICD-10-CM | POA: Diagnosis present

## 2023-10-17 ENCOUNTER — Ambulatory Visit: Payer: Medicare Other | Admitting: Pulmonary Disease

## 2023-10-17 ENCOUNTER — Encounter: Payer: Self-pay | Admitting: Pulmonary Disease

## 2023-10-17 VITALS — BP 110/60 | HR 79 | Temp 97.6°F | Ht 62.0 in | Wt 85.0 lb

## 2023-10-17 DIAGNOSIS — R64 Cachexia: Secondary | ICD-10-CM | POA: Diagnosis not present

## 2023-10-17 DIAGNOSIS — J984 Other disorders of lung: Secondary | ICD-10-CM | POA: Diagnosis not present

## 2023-10-17 DIAGNOSIS — J449 Chronic obstructive pulmonary disease, unspecified: Secondary | ICD-10-CM

## 2023-10-17 DIAGNOSIS — A319 Mycobacterial infection, unspecified: Secondary | ICD-10-CM

## 2023-10-17 DIAGNOSIS — R042 Hemoptysis: Secondary | ICD-10-CM | POA: Diagnosis not present

## 2023-10-17 NOTE — Progress Notes (Unsigned)
Subjective:    Patient ID: Alejandra Ortiz, female    DOB: 05/23/1951, 73 y.o.   MRN: [id number]  Patient Care Team: Lorre Munroe, NP as PCP - General (Internal Medicine) Tommie Sams, DO as Consulting Physician (Family Medicine) [place], [facility])  Chief Complaint  Patient presents with   Follow-up    Cough, shortness of breath on exertion. No wheezing.     BACKGROUND/INTERVAL:  HPI    Review of Systems A 10 point review of systems was performed and it is as noted above otherwise negative.   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   Lung nodules 01/17/2023   Aortic atherosclerosis (HCC) 08/17/2021   Hyperlipidemia 02/09/2016   COPD (chronic obstructive pulmonary disease) with emphysema (HCC) 02/07/2016    Social History   Tobacco Use   Smoking status: Every Day    Current packs/day: 1.00    Average packs/day: 1 pack/day for 48.0 years (48.0 ttl pk-yrs)    Types: Cigarettes   Smokeless tobacco: Never   Tobacco comments:    1 PPD - khj 09/20/2023  Substance Use Topics   Alcohol use: No    Alcohol/week: 0.0 standard drinks of alcohol    No Known Allergies  Current Meds  Medication Sig   albuterol (PROVENTIL) (2.5 MG/3ML) 0.083% nebulizer solution Take 3 mLs (2.5 mg total) by nebulization every 6 (six) hours as needed for wheezing or shortness of breath.   albuterol (VENTOLIN HFA) 108 (90 Base) MCG/ACT inhaler INHALE 2 PUFFS INTO THE LUNGS EVERY 6 HOURS AS NEEDED FOR WHEEZING OR SHORTNESS OF BREATH   aspirin EC 81 MG tablet Take 1 tablet (81 mg total) by mouth daily. Swallow whole.   azithromycin (ZITHROMAX) 250 MG tablet Take 1 tablet (250 mg total) by mouth daily. For mycobacterium avium treatmentFor mycobacterium avium treatment   ethambutol (MYAMBUTOL) 400 MG tablet Take 1.5 tablets (600 mg total) by mouth daily.   Fluticasone-Umeclidin-Vilant (TRELEGY ELLIPTA) 200-62.5-25  MCG/ACT AEPB Inhale 1 puff into the lungs daily.   rifampin (RIFADIN) 150 MG capsule Take 3 capsules (450 mg total) by mouth daily.    Immunization History  Administered Date(s) Administered   Fluad Quad(high Dose 65+) 07/08/2020, 07/03/2022   Fluad Trivalent(High Dose 65+) 05/07/2023   Influenza Split 05/27/2014   Influenza, High Dose Seasonal PF 06/07/2017, 06/28/2018, 06/26/2019, 08/03/2021   Influenza-Unspecified 06/25/2016   PFIZER(Purple Top)SARS-COV-2 Vaccination 10/30/2019, 11/20/2019, 05/26/2020   Pneumococcal Conjugate-13 11/08/2014   Pneumococcal Polysaccharide-23 11/19/2016   Td 11/08/2014   Tdap 05/19/2018        Objective:     There were no vitals taken for this visit.     GENERAL: HEAD: Normocephalic, atraumatic.  EYES: Pupils equal, round, reactive to light.  No scleral icterus.  MOUTH:  NECK: Supple. No thyromegaly. Trachea midline. No JVD.  No adenopathy. PULMONARY: Good air entry bilaterally.  No adventitious sounds. CARDIOVASCULAR: S1 and S2. Regular rate and rhythm.  ABDOMEN: MUSCULOSKELETAL: No joint deformity, no clubbing, no edema.  NEUROLOGIC:  SKIN: Intact,warm,dry. PSYCH:        Assessment & Plan:   No diagnosis found.  No orders of the defined types were placed in this encounter.   No orders of the defined types were placed in this encounter.      Advised if symptoms do not improve or worsen, to please contact office for sooner follow up or seek emergency care.  I spent xxx minutes of dedicated to the care of this patient on the date of this encounter to include pre-visit review of records, face-to-face time with the patient discussing conditions above, post visit ordering of testing, clinical documentation with the electronic health record, making appropriate referrals as documented, and communicating necessary findings to members of the patients care team.     C. Danice Goltz, MD Advanced Bronchoscopy PCCM [place]  [facility]    *This note was generated using voice recognition software/Dragon and/or AI transcription program.  Despite best efforts to proofread, errors can occur which can change the meaning. Any transcriptional errors that result from this process are unintentional and may not be fully corrected at the time of dictation.

## 2023-10-17 NOTE — Patient Instructions (Signed)
VISIT SUMMARY:  Alejandra Ortiz, a 73 year old female, visited [place] today to evaluate lung nodules and consider a biopsy. She has a lung nodule in the lower left lung that has changed shape and shows significant activity on recent scans. She is currently being treated for a Mycobacterium avium complex (MAC) infection and occasionally coughs up blood. She continues to smoke a pack of cigarettes daily.  YOUR PLAN:  -LUNG MASS:  Your last` has a thick wall and shows significant activity on scans, which could indicate a fungal infection or cancer. We will schedule a needle biopsy to determine the cause. The biopsy samples will be analyzed for cancer and fungal infections. Please be aware that there are risks, including potential bleeding. General anesthesia is not an option due to your lung condition, which may require ventilatory support.  -HEMOPTYSIS: Hemoptysis means coughing up blood. This is likely due to your frequent coughing. We will monitor this closely, especially with the upcoming biopsy. A sputum sample will be collected for lab analysis.  -MYCOBACTERIUM AVIUM COMPLEX (MAC) INFECTION: MAC infection is a type of bacterial infection that affects the lungs. You are responding well to your current medication. Please continue taking your medication as prescribed and we will keep monitoring your progress.  -SMOKING: Smoking is harmful to your lungs and overall health. Quitting smoking is crucial to improve your lung health and reduce complications. We strongly advise you to stop smoking.  INSTRUCTIONS:  Please schedule a needle biopsy with the interventional radiologist. Collect a sputum sample for lab analysis. Continue your current MAC medication. We will have a follow-up appointment in 4-6 weeks. Stay in contact regarding the biopsy results.

## 2023-10-18 ENCOUNTER — Encounter: Payer: Self-pay | Admitting: Pulmonary Disease

## 2023-10-18 NOTE — Progress Notes (Signed)
Alejandra Lack, MD sent to Alejandra Ortiz S PROCEDURE / BIOPSY REVIEW Date: 10/17/23  Requested Biopsy site: Lingular lung mass Reason for request: Enlarging lung mass Imaging review: Best seen on CT  Decision: Approved Imaging modality to perform: Ultrasound and CT Schedule with: Moderate Sedation Schedule for: Any VIR or I can do this, but am off next week and would have to be week after next or later if I was going to do it.  Additional comments: @VIR : I have discussed this extensively w/ Dr. Jayme Cloud and we also got another f/u CT to follow lesion. Lesion getting larger with thicker rim. Anterior and lateral rim probably best target. She has discussed risks extensively with patient, who is not a candidate for GA and bronchoscopy. @Schedulers .  Please contact me with questions, concerns, or if issue pertaining to this request arise.  Reola Calkins, MD [facility] Radiology

## 2023-10-22 ENCOUNTER — Other Ambulatory Visit
Admission: RE | Admit: 2023-10-22 | Discharge: 2023-10-22 | Disposition: A | Discharge status: Home / Self Care | Payer: Medicare Other | Source: Ambulatory Visit | Attending: Pulmonary Disease | Admitting: Pulmonary Disease

## 2023-10-22 DIAGNOSIS — J984 Other disorders of lung: Secondary | ICD-10-CM | POA: Diagnosis present

## 2023-10-22 DIAGNOSIS — R042 Hemoptysis: Secondary | ICD-10-CM | POA: Diagnosis present

## 2023-10-24 ENCOUNTER — Ambulatory Visit: Payer: Medicare Other | Admitting: Pulmonary Disease

## 2023-10-30 ENCOUNTER — Other Ambulatory Visit: Payer: Self-pay | Admitting: Student

## 2023-10-30 DIAGNOSIS — R918 Other nonspecific abnormal finding of lung field: Secondary | ICD-10-CM

## 2023-10-30 NOTE — Progress Notes (Signed)
 Patient for CT Lung Biopsy on Thurs 10/31/23, I called and spoke with the patient on the phone and gave pre-procedure instructions. Pt was made aware to be here at 9a, last dose of ASA 81mg  was Friday 2/28,  NPO after MN prior to procedure as well as driver post procedure/recovery/discharge. Pt stated understanding.  Called 10/25/23 and 10/30/23

## 2023-10-31 ENCOUNTER — Ambulatory Visit
Admission: RE | Admit: 2023-10-31 | Discharge: 2023-10-31 | Disposition: A | Discharge status: Home / Self Care | Payer: Medicare Other | Source: Ambulatory Visit | Attending: Pulmonary Disease | Admitting: Pulmonary Disease

## 2023-10-31 ENCOUNTER — Other Ambulatory Visit: Payer: Self-pay

## 2023-10-31 ENCOUNTER — Ambulatory Visit
Admission: RE | Admit: 2023-10-31 | Discharge: 2023-10-31 | Disposition: A | Discharge status: Home / Self Care | Source: Ambulatory Visit | Attending: Interventional Radiology | Admitting: Interventional Radiology

## 2023-10-31 DIAGNOSIS — C3412 Malignant neoplasm of upper lobe, left bronchus or lung: Secondary | ICD-10-CM | POA: Insufficient documentation

## 2023-10-31 DIAGNOSIS — J439 Emphysema, unspecified: Secondary | ICD-10-CM | POA: Insufficient documentation

## 2023-10-31 DIAGNOSIS — E785 Hyperlipidemia, unspecified: Secondary | ICD-10-CM | POA: Diagnosis not present

## 2023-10-31 DIAGNOSIS — Z9889 Other specified postprocedural states: Secondary | ICD-10-CM | POA: Diagnosis not present

## 2023-10-31 DIAGNOSIS — R918 Other nonspecific abnormal finding of lung field: Secondary | ICD-10-CM | POA: Diagnosis present

## 2023-10-31 DIAGNOSIS — J984 Other disorders of lung: Secondary | ICD-10-CM | POA: Diagnosis present

## 2023-10-31 DIAGNOSIS — A310 Pulmonary mycobacterial infection: Secondary | ICD-10-CM | POA: Insufficient documentation

## 2023-10-31 DIAGNOSIS — F17210 Nicotine dependence, cigarettes, uncomplicated: Secondary | ICD-10-CM | POA: Diagnosis not present

## 2023-10-31 HISTORY — DX: Measles without complication: B05.9

## 2023-10-31 LAB — PROTIME-INR
INR: 1.2 (ref 0.8–1.2)
Prothrombin Time: 15.1 s (ref 11.4–15.2)

## 2023-10-31 LAB — CBC
HCT: 34.8 % — ABNORMAL LOW (ref 36.0–46.0)
Hemoglobin: 11.4 g/dL — ABNORMAL LOW (ref 12.0–15.0)
MCH: 30.9 pg (ref 26.0–34.0)
MCHC: 32.8 g/dL (ref 30.0–36.0)
MCV: 94.3 fL (ref 80.0–100.0)
Platelets: 334 10*3/uL (ref 150–400)
RBC: 3.69 MIL/uL — ABNORMAL LOW (ref 3.87–5.11)
RDW: 13.2 % (ref 11.5–15.5)
WBC: 9.1 10*3/uL (ref 4.0–10.5)
nRBC: 0 % (ref 0.0–0.2)

## 2023-10-31 MED ORDER — MIDAZOLAM HCL 2 MG/2ML IJ SOLN
INTRAMUSCULAR | Status: AC | PRN
  Administered 2023-10-31: .5 mg via INTRAVENOUS

## 2023-10-31 MED ORDER — FENTANYL CITRATE (PF) 100 MCG/2ML IJ SOLN
INTRAMUSCULAR | Status: AC
  Filled 2023-10-31: qty 2

## 2023-10-31 MED ORDER — MIDAZOLAM HCL 2 MG/2ML IJ SOLN
INTRAMUSCULAR | Status: AC
  Filled 2023-10-31: qty 2

## 2023-10-31 MED ORDER — LIDOCAINE HCL (PF) 1 % IJ SOLN
10.0000 mL | Freq: Once | INTRAMUSCULAR | Status: AC
  Administered 2023-10-31: 10 mL via INTRADERMAL
  Filled 2023-10-31: qty 10

## 2023-10-31 MED ORDER — FENTANYL CITRATE (PF) 100 MCG/2ML IJ SOLN
INTRAMUSCULAR | Status: AC | PRN
  Administered 2023-10-31 (×2): 25 ug via INTRAVENOUS

## 2023-10-31 MED ORDER — MIDAZOLAM HCL 5 MG/5ML IJ SOLN
INTRAMUSCULAR | Status: AC | PRN
Start: 2023-10-31 — End: 2023-10-31
  Administered 2023-10-31: .5 mg via INTRAVENOUS

## 2023-10-31 NOTE — Procedures (Signed)
 Interventional Radiology Procedure Note  Procedure: CT Guided Biopsy of lingular cavitary lung mass  Complications: None  Estimated Blood Loss: < 10 mL  Findings: 18 G core biopsy of lingular cavitary mass performed under CT guidance.  Two core samples obtained and sent to Pathology.  Jodi Marble. Fredia Sorrow, M.D Pager:  [phone]

## 2023-10-31 NOTE — Progress Notes (Signed)
 Patient clinically stable post CT Lung biopsy per Dr Fredia Sorrow, tolerated well. Vitals stable pre and post procedure. Denies complaints post procedure. Received Versed 1 mg along with Fentanyl 50 mcg IV for procedure. Report given to Marni Griffon Rn post procedure. No visible signs of distress nor pneumo post procedure.

## 2023-10-31 NOTE — H&P (Signed)
 Chief Complaint: Patient was seen in consultation today for cavitating mass in the left upper lung lobe.  at the request of Gonzalez,Carmen L  Referring Physician(s): Salena Saner  Supervising Physician: Irish Lack  Patient Status: [facility] - Out-pt  Full Code  History of Present Illness: Alejandra Ortiz is a 73 y.o. female with history of COPD-current tobacco use and hyperlipidemia. Patient has a history of routine chest Cts for lung cancer screening. In the past, there has been evidence of nodules. However, CT in March 2024 reported the impression below.  CT 11/21/22 Impression 1. Lung-RADS 4B, suspicious. Additional imaging evaluation or consultation with Pulmonology or Thoracic Surgery recommended. While some pulmonary nodule seen previously are decrease, there has been interval progression of 2 of the more suspicious nodules on the previous study including the superior segment right lower lobe lesion measuring 18.0 mm today compared to 11.7 mm previously and left lower lobe nodule measuring 20.2 mm today compared to 7 mm previously. There is a new 7 mm left upper lobe nodule on today's study. Patient has a history of waxing and waning relatively prominent bilateral pulmonary nodules dating back to 2022. As such, given that the patient is closely followed by the pulmonary service and has a history of repeated Lung-Rads 4B results on screening studies dating back to 08/22/2021, this patient is not considered a good candidate for routine lung cancer screening and would likely be better served by routine surveillance non screening chest CT. 2. Extensive pleuroparenchymal scarring in both lung apices, right greater than left, the consolidative component in the posterior right apex is progressive in the interval. Consolidative pneumonia could certainly have this appearance although neoplasm such as adenocarcinoma could also present similarly. 3. Aortic Atherosclerosis  (ICD10-I70.0) and Emphysema (ICD10-J43.9).   At a subsequent follow up with pulmonology, a sputum culture was ordered. This culture was positive for MAI around May 2024. Patient was established with Dr. Rivka Safer with infectious disease. Patient was started on treatment in July 2024. Repeat CT 03/2023 did not report a significant change. Patient had another follow up chest CT 09/26/2023, which the impression is listed below.    CT chest 09/26/23 Impression 1. Slowly enlarging thick walled cavitary mass in the lingula now measuring approximately 4.2 x 4.1 x 4.4 cm. There is subtle evidence of invasion into the lateral chest wall between the anterolateral aspects of the left sixth and seventh ribs. Additionally, there is increasing spiculation about the margin of the mass. Overall, these findings are highly concerning for primary bronchogenic carcinoma (squamous cell). 2. 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 3. Aortic atherosclerotic vascular calcifications. 4. Centrilobular pulmonary emphysema.   Patient presents today for a biopsy of the left lung mass. Patient's last dose of aspirin was 10/24/23. Patient has her daughters present today. She denies new chest pain, shortness of breath, vomiting, abdominal pain, diarrhea, and/or fevers.     Past Medical History:  Diagnosis Date   Chicken pox    COPD (chronic obstructive pulmonary disease) (HCC)    Hyperlipidemia    Measles     Past Surgical History:  Procedure Laterality Date   TUBAL LIGATION  1978    Allergies: Patient has no known allergies.  Medications: Prior to Admission medications   Medication Sig Start Date End Date Taking? Authorizing Provider  albuterol (PROVENTIL) (2.5 MG/3ML) 0.083% nebulizer solution Take 3 mLs (2.5 mg total)  by nebulization every 6 (six) hours  as needed for wheezing or shortness of breath. 01/17/23 01/17/24  Parrett, Virgel Bouquet, NP  albuterol (VENTOLIN HFA) 108 (90 Base) MCG/ACT inhaler INHALE 2 PUFFS INTO THE LUNGS EVERY 6 HOURS AS NEEDED FOR WHEEZING OR SHORTNESS OF BREATH 04/05/21   Allegra Grana, FNP  aspirin EC 81 MG tablet Take 1 tablet (81 mg total) by mouth daily. Swallow whole. 08/30/22   Lorre Munroe, NP  azithromycin (ZITHROMAX) 250 MG tablet Take 1 tablet (250 mg total) by mouth daily. For mycobacterium avium treatmentFor mycobacterium avium treatment 07/29/23   Lynn Ito, MD  ethambutol (MYAMBUTOL) 400 MG tablet Take 1.5 tablets (600 mg total) by mouth daily. 07/29/23   Lynn Ito, MD  Fluticasone-Umeclidin-Vilant (TRELEGY ELLIPTA) 200-62.5-25 MCG/ACT AEPB Inhale 1 puff into the lungs daily. 12/26/22   Salena Saner, MD  rifampin (RIFADIN) 150 MG capsule Take 3 capsules (450 mg total) by mouth daily. 07/29/23   Lynn Ito, MD     Family History  Family history unknown: Yes    Social History   Socioeconomic History   Marital status: Married    Spouse name: Youa Deloney   Number of children: 5   Years of education: Not on file   Highest education level: Not on file  Occupational History   Occupation: Knit    Comment: [employer]  Tobacco Use   Smoking status: Every Day    Current packs/day: 1.00    Average packs/day: 1 pack/day for 48.0 years (48.0 ttl pk-yrs)    Types: Cigarettes   Smokeless tobacco: Never   Tobacco comments:    1 PPD - khj 09/20/2023  Vaping Use   Vaping status: Never Used  Substance and Sexual Activity   Alcohol use: No    Alcohol/week: 0.0 standard drinks of alcohol   Drug use: No   Sexual activity: Never    Birth control/protection: Surgical  Other Topics Concern   Not on file  Social History Narrative   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   Lives with husband and 5 children with 12 grandchildren, 4 great-grandchildren   1 dog  lives inside    Associates Degree   Right hand dominant    Enjoys reading    Social Drivers of Corporate investment banker Strain: Low Risk  (08/30/2023)   Overall Financial Resource Strain (CARDIA)    Difficulty of Paying Living Expenses: Not hard at all  Food Insecurity: No Food Insecurity (08/30/2023)   Hunger Vital Sign    Worried About Running Out of Food in the Last Year: Never true    Ran Out of Food in the Last Year: Never true  Transportation Needs: No Transportation Needs (08/30/2023)   PRAPARE - Administrator, Civil Service (Medical): No    Lack of Transportation (Non-Medical): No  Physical Activity: Insufficiently Active (08/30/2023)   Exercise Vital Sign    Days of Exercise per Week: 3 days    Minutes of Exercise per Session: 30 min  Stress: No Stress Concern Present (08/30/2023)   [employer] of Occupational Health - Occupational Stress Questionnaire    Feeling of Stress : Not at all  Social Connections: Moderately Isolated (08/30/2023)   Social Connection and Isolation Panel [NHANES]    Frequency of Communication with Friends and Family: Once a week    Frequency of Social Gatherings with Friends and Family: More than three times a week    Attends Religious Services:  Never    Active Member of Clubs or Organizations: No    Attends Banker Meetings: Never    Marital Status: Married     Review of Systems: A 12 point ROS discussed and pertinent positives are indicated in the HPI above.  All other systems are negative.  Review of Systems  Constitutional:  Negative for fever.  Respiratory:  Negative for shortness of breath.   Cardiovascular:  Negative for chest pain.  Gastrointestinal:  Negative for abdominal pain, diarrhea and vomiting.    Vital Signs: BP (!) 127/50   Pulse 83   Temp 98.3 F (36.8 C) (Oral)   Resp 20   Ht 5\' 2"  (1.575 m)   Wt 78 lb 9.6 oz (35.7 kg)   SpO2 96%   BMI 14.38 kg/m    Physical Exam HENT:     Head:  Normocephalic.     Mouth/Throat:     Mouth: Mucous membranes are moist.     Pharynx: Oropharynx is clear.     Comments: Brown patch on lingua. Cardiovascular:     Rate and Rhythm: Normal rate and regular rhythm.  Pulmonary:     Effort: Pulmonary effort is normal.     Breath sounds: Wheezing present.  Abdominal:     General: Abdomen is flat.     Palpations: Abdomen is soft.  Skin:    General: Skin is warm.  Neurological:     General: No focal deficit present.     Mental Status: She is alert and oriented to person, place, and time.     Imaging: No results found.  Labs:  CBC: Recent Labs    01/31/23 1126 03/19/23 0929 04/18/23 0913 06/18/23 1240  WBC 9.2 7.3 7.9 7.2  HGB 10.5* 12.4 12.4 11.1*  HCT 34.2* 39.8 40.1 34.7*  PLT 349 343 287 294    COAGS: No results for input(s): "INR", "APTT" in the last 8760 hours.  BMP: Recent Labs    01/31/23 1126 03/19/23 0929 04/18/23 0913 06/03/23 1435 06/18/23 1240  NA 137 136 136 137 135  K 4.0 4.6 4.2 4.4 4.3  CL 103 101 99 100 102  CO2 25 26 25 27 25   GLUCOSE 108* 106* 113* 106 102*  BUN 14 11 7* 10 12  CALCIUM 9.1 9.4 9.1 9.4 9.0  CREATININE 0.84 0.85 0.85 0.83 0.74  GFRNONAA >60 >60 >60  --  >60    LIVER FUNCTION TESTS: Recent Labs    01/31/23 1126 03/19/23 0929 04/18/23 0913 06/03/23 1435 06/18/23 1240  BILITOT 0.4 0.4 0.6 0.5 0.7  AST 22 16 18 13  12*  ALT 14 17 15 9 10   ALKPHOS 81 86 83  --  66  PROT 7.5 7.9 8.1 6.8 7.6  ALBUMIN 3.3* 3.5 3.5  --  3.4*    TUMOR MARKERS: No results for input(s): "AFPTM", "CEA", "CA199", "CHROMGRNA" in the last 8760 hours.  Assessment and Plan: cavitating mass in the left upper lung lobe.  Patient is a 73 y/o female smoker with history of COPD and lung nodules. More specifically, patient is here to biopsy a cavitary lesion of the left lung. Patient is NPO, aspirin has been held and she has family present to driver her.   Risks and benefits of lung biopsy was  discussed with the patient and/or patient's family including, but not limited to bleeding, infection, damage to adjacent structures or low yield requiring additional tests.  All of the questions were answered and there is agreement  to proceed.  Consent signed and in chart.   Thank you for this interesting consult.  I greatly enjoyed meeting Alejandra Ortiz and look forward to participating in their care.  A copy of this report was sent to the requesting provider on this date.  Electronically Signed: Rosalita Levan, PA 10/31/2023, 9:29 AM   I spent a total of  30 Minutes   in face to face in clinical consultation, greater than 50% of which was counseling/coordinating care for cavitating mass in the left upper lung lobe.

## 2023-11-04 ENCOUNTER — Other Ambulatory Visit: Payer: Self-pay | Admitting: Pulmonary Disease

## 2023-11-04 DIAGNOSIS — J449 Chronic obstructive pulmonary disease, unspecified: Secondary | ICD-10-CM

## 2023-11-04 DIAGNOSIS — C3492 Malignant neoplasm of unspecified part of left bronchus or lung: Secondary | ICD-10-CM

## 2023-11-04 DIAGNOSIS — A319 Mycobacterial infection, unspecified: Secondary | ICD-10-CM

## 2023-11-04 LAB — SURGICAL PATHOLOGY

## 2023-11-04 NOTE — Progress Notes (Signed)
 Patient had biopsy of the left lung cavitary mass that has shown squamous cell carcinoma as well as few AFB organisms (known MAC).  Will refer to cancer center.  Patient is not a surgical candidate.  Is aware of findings and of referral.  C. Danice Goltz, MD Advanced Bronchoscopy PCCM [place] [facility]

## 2023-11-07 ENCOUNTER — Encounter: Payer: Self-pay | Admitting: *Deleted

## 2023-11-07 ENCOUNTER — Inpatient Hospital Stay

## 2023-11-07 ENCOUNTER — Inpatient Hospital Stay: Attending: Oncology | Admitting: Oncology

## 2023-11-07 ENCOUNTER — Encounter: Payer: Self-pay | Admitting: Oncology

## 2023-11-07 VITALS — BP 132/58 | HR 76 | Temp 97.7°F | Resp 18 | Ht 62.0 in | Wt 82.5 lb

## 2023-11-07 DIAGNOSIS — F17210 Nicotine dependence, cigarettes, uncomplicated: Secondary | ICD-10-CM | POA: Diagnosis not present

## 2023-11-07 DIAGNOSIS — C3490 Malignant neoplasm of unspecified part of unspecified bronchus or lung: Secondary | ICD-10-CM

## 2023-11-07 DIAGNOSIS — K639 Disease of intestine, unspecified: Secondary | ICD-10-CM | POA: Insufficient documentation

## 2023-11-07 DIAGNOSIS — C3432 Malignant neoplasm of lower lobe, left bronchus or lung: Secondary | ICD-10-CM | POA: Diagnosis present

## 2023-11-07 DIAGNOSIS — K629 Disease of anus and rectum, unspecified: Secondary | ICD-10-CM

## 2023-11-07 LAB — CBC WITH DIFFERENTIAL/PLATELET
Abs Immature Granulocytes: 0.04 10*3/uL (ref 0.00–0.07)
Basophils Absolute: 0 10*3/uL (ref 0.0–0.1)
Basophils Relative: 0 %
Eosinophils Absolute: 0.1 10*3/uL (ref 0.0–0.5)
Eosinophils Relative: 1 %
HCT: 34.8 % — ABNORMAL LOW (ref 36.0–46.0)
Hemoglobin: 11.1 g/dL — ABNORMAL LOW (ref 12.0–15.0)
Immature Granulocytes: 0 %
Lymphocytes Relative: 12 %
Lymphs Abs: 1.1 10*3/uL (ref 0.7–4.0)
MCH: 30.2 pg (ref 26.0–34.0)
MCHC: 31.9 g/dL (ref 30.0–36.0)
MCV: 94.8 fL (ref 80.0–100.0)
Monocytes Absolute: 0.5 10*3/uL (ref 0.1–1.0)
Monocytes Relative: 6 %
Neutro Abs: 7.3 10*3/uL (ref 1.7–7.7)
Neutrophils Relative %: 81 %
Platelets: 371 10*3/uL (ref 150–400)
RBC: 3.67 MIL/uL — ABNORMAL LOW (ref 3.87–5.11)
RDW: 13.2 % (ref 11.5–15.5)
WBC: 9.1 10*3/uL (ref 4.0–10.5)
nRBC: 0 % (ref 0.0–0.2)

## 2023-11-07 LAB — CMP (CANCER CENTER ONLY)
ALT: 10 U/L (ref 0–44)
AST: 11 U/L — ABNORMAL LOW (ref 15–41)
Albumin: 3 g/dL — ABNORMAL LOW (ref 3.5–5.0)
Alkaline Phosphatase: 65 U/L (ref 38–126)
Anion gap: 11 (ref 5–15)
BUN: 8 mg/dL (ref 8–23)
CO2: 23 mmol/L (ref 22–32)
Calcium: 9 mg/dL (ref 8.9–10.3)
Chloride: 102 mmol/L (ref 98–111)
Creatinine: 0.76 mg/dL (ref 0.44–1.00)
GFR, Estimated: >60 mL/min (ref >60)
Glucose, Bld: 94 mg/dL (ref 70–99)
Potassium: 3.9 mmol/L (ref 3.5–5.1)
Sodium: 136 mmol/L (ref 135–145)
Total Bilirubin: 0.4 mg/dL (ref 0.0–1.2)
Total Protein: 7.4 g/dL (ref 6.5–8.1)

## 2023-11-07 NOTE — Progress Notes (Signed)
 Met with patient and her daughter during initial consult with Dr. Orlie Dakin. All questions answered during visit. Reviewed upcoming appts. Contact info given and instructed to call with any questions or needs. Pt and her daughter verbalized understanding.

## 2023-11-07 NOTE — Progress Notes (Unsigned)
 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  INTERVAL HISTORY: Patient is a 73 year old female who recently underwent CT-guided biopsy of a left lower lobe lung lesion with results consistent with squamous cell carcinoma.  She also has a right upper lobe lesion that is thought to be infectious.  Finally, PET scan revealed a sigmoid lesion that was hypermetabolic of unknown significance.  She is anxious, but otherwise feels well.  She has no neurologic complaints.  She denies any recent fevers or illnesses.  She has good appetite and denies weight loss.  She has no chest pain, shortness of breath, cough, or hemoptysis.  She denies any nausea, vomiting, constipation, or diarrhea.  She has no urinary complaints.  Patient offers no further specific complaints today.  REVIEW OF SYSTEMS:   Review of Systems  Constitutional: Negative.  Negative for fever, malaise/fatigue and weight loss.  Respiratory: Negative.  Negative for cough, hemoptysis and shortness of breath.   Cardiovascular: Negative.  Negative for chest pain and leg swelling.  Gastrointestinal: Negative.  Negative for abdominal pain, blood in stool and melena.  Genitourinary: Negative.  Negative for dysuria.  Musculoskeletal: Negative.  Negative for back pain.  Skin: Negative.  Negative for rash.  Neurological: Negative.  Negative for dizziness, focal weakness, weakness and headaches.  Psychiatric/Behavioral: Negative.  The patient is not nervous/anxious.     As per HPI. Otherwise, a complete  review of systems is negative.  PAST MEDICAL HISTORY: Past Medical History:  Diagnosis Date   Chicken pox    COPD (chronic obstructive pulmonary disease) (HCC)    Hyperlipidemia    Measles     PAST SURGICAL HISTORY: Past Surgical History:  Procedure Laterality Date   TUBAL LIGATION  1978    FAMILY HISTORY: Family History  Family history unknown: Yes    ADVANCED DIRECTIVES (Y/N):  N  HEALTH MAINTENANCE: Social History   Tobacco Use   Smoking status: Every Day    Current packs/day: 1.00    Average packs/day: 1 pack/day for 48.0 years (48.0 ttl pk-yrs)    Types: Cigarettes   Smokeless tobacco: Never   Tobacco comments:    1 PPD - khj 09/20/2023  Vaping Use   Vaping status: Never Used  Substance Use Topics   Alcohol use: No    Alcohol/week: 0.0 standard drinks of alcohol   Drug use: No     Colonoscopy:  PAP:  Bone density:  Lipid panel:  No Known Allergies  Current Outpatient Medications  Medication Sig Dispense Refill   albuterol (PROVENTIL) (2.5 MG/3ML) 0.083% nebulizer solution Take 3 mLs (2.5 mg total) by nebulization every 6 (six) hours as needed for wheezing or shortness of breath. 75 mL 5   albuterol (VENTOLIN HFA) 108 (90 Base) MCG/ACT inhaler INHALE 2 PUFFS INTO THE LUNGS EVERY 6 HOURS AS NEEDED FOR WHEEZING OR SHORTNESS OF BREATH 54 g 0   aspirin EC 81 MG tablet Take 1 tablet (81 mg total) by mouth daily. Swallow whole. 30 tablet 12   azithromycin (ZITHROMAX) 250 MG tablet Take 1 tablet (250 mg total) by mouth daily. For mycobacterium avium treatmentFor  mycobacterium avium treatment 30 each 2   ethambutol (MYAMBUTOL) 400 MG tablet Take 1.5 tablets (600 mg total) by mouth daily. 45 tablet 2   Fluticasone-Umeclidin-Vilant (TRELEGY ELLIPTA) 200-62.5-25 MCG/ACT AEPB Inhale 1 puff into the lungs daily. 180 each 3   rifampin (RIFADIN) 150 MG capsule Take 3 capsules (450 mg total) by mouth daily. 90 capsule 2   No current facility-administered medications for  this visit.    OBJECTIVE: Vitals:   11/07/23 1455  BP: (!) 132/58  Pulse: 76  Resp: 18  Temp: 97.7 F (36.5 C)  SpO2: 98%     Body mass index is 15.09 kg/m.    ECOG FS:0 - Asymptomatic  General: Well-developed, well-nourished, no acute distress. Eyes: Pink conjunctiva, anicteric sclera. HEENT: Normocephalic, moist mucous membranes. Lungs: No audible wheezing or coughing. Heart: Regular rate and rhythm. Abdomen: Soft, nontender, no obvious distention. Musculoskeletal: No edema, cyanosis, or clubbing. Neuro: Alert, answering all questions appropriately. Cranial nerves grossly intact. Skin: No rashes or petechiae noted. Psych: Normal affect. Lymphatics: No cervical, calvicular, axillary or inguinal LAD.   LAB RESULTS:  Lab Results  Component Value Date   NA 136 11/07/2023   K 3.9 11/07/2023   CL 102 11/07/2023   CO2 23 11/07/2023   GLUCOSE 94 11/07/2023   BUN 8 11/07/2023   CREATININE 0.76 11/07/2023   CALCIUM 9.0 11/07/2023   PROT 7.4 11/07/2023   ALBUMIN 3.0 (L) 11/07/2023   AST 11 (L) 11/07/2023   ALT 10 11/07/2023   ALKPHOS 65 11/07/2023   BILITOT 0.4 11/07/2023   GFRNONAA >60 11/07/2023   GFRAA >60 09/25/2013    Lab Results  Component Value Date   WBC 9.1 11/07/2023   NEUTROABS 7.3 11/07/2023   HGB 11.1 (L) 11/07/2023   HCT 34.8 (L) 11/07/2023   MCV 94.8 11/07/2023   PLT 371 11/07/2023     STUDIES: DG Chest Port 1 View Result Date: 10/31/2023 CLINICAL DATA:  Status post percutaneous biopsy of cavitary mass in the lingula. EXAM: PORTABLE CHEST 1 VIEW COMPARISON:  Imaging during CT-guided biopsy earlier today. FINDINGS: The heart size and mediastinal contours are within normal limits. Severe emphysematous lung disease noted with bilateral hyperinflation. Rounded cavitary mass of the lingula measures up to approximately 4.9 cm in height. No pneumothorax identified by chest x-ray after the procedure. No significant pleural fluid. No pulmonary edema. The  visualized skeletal structures are unremarkable. IMPRESSION: 1. No pneumothorax identified after percutaneous biopsy of cavitary mass in the lingula. 2. Severe emphysematous lung disease with bilateral hyperinflation. Electronically Signed   By: Irish Lack M.D.   On: 10/31/2023 13:09   CT LUNG MASS BIOPSY Result Date: 10/31/2023 CLINICAL DATA:  Enlarging cavitary mass of the lingula despite treatment for MAC. The patient presents for percutaneous biopsy of the lesion as she is not a candidate for bronchoscopy given severe lung disease and contraindication to undergoing general anesthesia. EXAM: CT GUIDED CORE BIOPSY OF LUNG MASS ANESTHESIA/SEDATION: Moderate (conscious) sedation was employed during this procedure. A total of Versed 1.0 mg and Fentanyl 50 mcg was administered intravenously. Moderate Sedation Time: 16 minutes. The patient's level of consciousness and vital signs were monitored continuously by radiology nursing throughout the procedure under my direct supervision. PROCEDURE: The procedure risks, benefits, and alternatives were explained to the patient. Questions regarding the procedure were encouraged and answered. The patient understands and consents to the procedure. A time-out was performed prior to initiating the procedure. The left chest wall was prepped with chlorhexidine in  a sterile fashion, and a sterile drape was applied covering the operative field. A sterile gown and sterile gloves were used for the procedure. Local anesthesia was provided with 1% Lidocaine. The patient was placed in a supine position with the left side rolled up slightly and CT performed through the chest. After localizing a mass within the lingula, a 17 gauge trocar needle was advanced under CT guidance to the anterolateral edge of the mass. Two separate core biopsy samples were then obtained through the outer needle. One was sent for pathologic analysis and the other sent for fungal culture. A BioSentry plug was  deposited at the pleural entry site via the trocar needle and the trocar needle removed. Additional CT was performed. RADIATION DOSE REDUCTION: This exam was performed according to the departmental dose-optimization program which includes automated exposure control, adjustment of the mA and/or kV according to patient size and/or use of iterative reconstruction technique. COMPLICATIONS: None FINDINGS: Initial CT demonstrates further slight enlargement of the cavitary mass in the lingula since prior CT on 09/26/2023. Maximum transverse diameter has increased from approximately 4.2 cm on the prior study up to 4.5 cm currently. Solid core biopsy samples were obtained. During biopsy, a tiny amount of air was noticed along the lateral margin of the mass which on completion appears to be extrapleural. A follow-up chest x-ray will be performed to ensure that there is no enlarging component of pneumothorax. IMPRESSION: CT-guided core biopsy of a cavitary mass in the lingula currently measuring up to 4.5 cm in maximum transverse diameter and demonstrating some enlargement since the most recent CT on 09/26/2023. Core biopsy samples were obtained from the solid anterior portion of the mass and sent for pathologic analysis and fungal culture. Electronically Signed   By: Irish Lack M.D.   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  Although patient is currently staged as IIa, her PET scan was nearly 3 months ago, therefore will repeat to update her staging workup.  If she does not have an increase in stage, would consider XRT only.  Although she would be considered a high risk stage II, given her ongoing MAC infection would be hesitant to use chemotherapy unless PET scan increased her staging to  III or higher.  Patient will also require an MRI of the brain to complete staging workup.  Return to clinic after her PET scan for discussion of the results, consultation with radiation oncology, and treatment planning. Hypermetabolic sigmoid lesion: Patient was given referral to GI for further evaluation and consideration of colonoscopy. CEA is within normal limits at 3.8.  Patient cannot undergo general anesthesia. MAC infection: Continue current antibiotics.  Follow-up and treatment per pulmonary.  I spent a total of 60 minutes reviewing chart data, face-to-face evaluation with the patient, counseling and coordination of care as detailed above.  Patient expressed understanding and was in agreement with this plan. She also understands that She can call clinic at any time with any questions, concerns, or complaints.    Cancer Staging  Squamous cell carcinoma of lower lobe of left lung (HCC) Staging form: Lung, AJCC 8th Edition - Clinical stage from 11/08/2023: Stage IIA (cT2b, cN0, cM0) - Signed by Jeralyn Ruths, MD on 11/08/2023 Stage prefix: Initial diagnosis   Jeralyn Ruths, MD  11/08/2023 10:15 AM

## 2023-11-08 ENCOUNTER — Other Ambulatory Visit: Payer: Self-pay | Admitting: *Deleted

## 2023-11-08 DIAGNOSIS — C3432 Malignant neoplasm of lower lobe, left bronchus or lung: Secondary | ICD-10-CM

## 2023-11-08 LAB — CEA: CEA: 3.8 ng/mL (ref 0.0–4.7)

## 2023-11-08 NOTE — Progress Notes (Signed)
 Per Dr. Orlie Dakin, pt will need brain MRI w/wo contrast to complete lung cancer staging. Order placed. Pt will be called with appt once scheduled.

## 2023-11-12 ENCOUNTER — Ambulatory Visit
Admission: RE | Admit: 2023-11-12 | Discharge: 2023-11-12 | Disposition: A | Discharge status: Home / Self Care | Source: Ambulatory Visit | Attending: Oncology | Admitting: Oncology

## 2023-11-12 DIAGNOSIS — K118 Other diseases of salivary glands: Secondary | ICD-10-CM | POA: Diagnosis not present

## 2023-11-12 DIAGNOSIS — K629 Disease of anus and rectum, unspecified: Secondary | ICD-10-CM | POA: Insufficient documentation

## 2023-11-12 DIAGNOSIS — C3432 Malignant neoplasm of lower lobe, left bronchus or lung: Secondary | ICD-10-CM | POA: Insufficient documentation

## 2023-11-12 DIAGNOSIS — I700 Atherosclerosis of aorta: Secondary | ICD-10-CM | POA: Diagnosis not present

## 2023-11-12 DIAGNOSIS — C3490 Malignant neoplasm of unspecified part of unspecified bronchus or lung: Secondary | ICD-10-CM

## 2023-11-12 DIAGNOSIS — J439 Emphysema, unspecified: Secondary | ICD-10-CM | POA: Diagnosis not present

## 2023-11-12 LAB — GLUCOSE, CAPILLARY: Glucose-Capillary: 93 mg/dL (ref 70–99)

## 2023-11-12 MED ORDER — GADOBUTROL 1 MMOL/ML IV SOLN
3.0000 mL | Freq: Once | INTRAVENOUS | Status: AC | PRN
  Administered 2023-11-12: 3 mL via INTRAVENOUS

## 2023-11-12 MED ORDER — FLUDEOXYGLUCOSE F - 18 (FDG) INJECTION
5.3300 | Freq: Once | INTRAVENOUS | Status: AC | PRN
  Administered 2023-11-12: 5.33 via INTRAVENOUS

## 2023-11-13 ENCOUNTER — Inpatient Hospital Stay (HOSPITAL_BASED_OUTPATIENT_CLINIC_OR_DEPARTMENT_OTHER): Admitting: Hospice and Palliative Medicine

## 2023-11-13 DIAGNOSIS — C3432 Malignant neoplasm of lower lobe, left bronchus or lung: Secondary | ICD-10-CM

## 2023-11-13 NOTE — Progress Notes (Signed)
 Multidisciplinary Oncology Council Documentation  MELICIA ESQUEDA was presented by our [facility] on 11/13/2023, which included representatives from:  Palliative Care Dietitian  Physical/Occupational Therapist Nurse Navigator Genetics Social work Survivorship RN Financial Navigator Research RN   Loleta currently presents with history of lung mass  We reviewed previous medical and familial history, history of present illness, and recent lab results along with all available histopathologic and imaging studies. The MOC considered available treatment options and made the following recommendations/referrals:  Nutrition, SW  The MOC is a meeting of clinicians from various specialty areas who evaluate and discuss patients for whom a multidisciplinary approach is being considered. Final determinations in the plan of care are those of the provider(s).   Today's extended care, comprehensive team conference, Cameo was not present for the discussion and was not examined.

## 2023-11-14 ENCOUNTER — Inpatient Hospital Stay

## 2023-11-14 NOTE — Progress Notes (Signed)
 [facility] Clinical Social Work  Clinical Social Work was referred by  [facility]  for assessment of psychosocial needs.  Clinical Social Worker contacted patient by phone to offer support and assess for needs.    Patient stated she had no needs or questions at the moment.  She agreed to have CSW mail her [employer] information and contact information.     Dorothey Baseman, LCSW  Clinical Social Worker [facility]

## 2023-11-18 ENCOUNTER — Inpatient Hospital Stay (HOSPITAL_BASED_OUTPATIENT_CLINIC_OR_DEPARTMENT_OTHER): Admitting: Oncology

## 2023-11-18 ENCOUNTER — Encounter: Payer: Self-pay | Admitting: *Deleted

## 2023-11-18 ENCOUNTER — Encounter: Payer: Self-pay | Admitting: Oncology

## 2023-11-18 VITALS — BP 133/55 | HR 69 | Temp 98.4°F | Resp 16 | Ht 62.0 in | Wt 84.0 lb

## 2023-11-18 DIAGNOSIS — C3432 Malignant neoplasm of lower lobe, left bronchus or lung: Secondary | ICD-10-CM

## 2023-11-18 NOTE — Progress Notes (Signed)
 Met with patient during follow up visit with Dr. Orlie Dakin to discuss recent imaging results. All questions answered during visit. Reviewed upcoming appts. Instructed to call with any questions or needs. Pt verbalized understanding.

## 2023-11-18 NOTE — Addendum Note (Signed)
 Addended by: Glory Buff on: 11/18/2023 01:37 PM   Modules accepted: Orders

## 2023-11-18 NOTE — Progress Notes (Signed)
 Per Dr. Orlie Dakin, pt will need molecular testing on recent biopsy sample if tissue is sufficient. Per path report, tissue is insufficient for testing. Will obtain blood collection at next appt to send out for [employer]cul[employer].

## 2023-11-18 NOTE — Progress Notes (Signed)
 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  INTERVAL HISTORY: Patient returns to clinic today for further evaluation, discussion of her PET scan results, and treatment planning.  She continues to feel well and remains asymptomatic.  She has no neurologic complaints.  She denies any recent fevers or illnesses.  She has a good appetite and denies weight loss.  She has no chest pain, shortness of breath, cough, or hemoptysis.  She denies any nausea, vomiting, constipation, or diarrhea.  She has no urinary complaints.  Patient offers no specific complaints today.  REVIEW OF SYSTEMS:   Review of Systems  Constitutional: Negative.  Negative for fever, malaise/fatigue and weight loss.  Respiratory: Negative.  Negative for cough, hemoptysis and shortness of breath.   Cardiovascular: Negative.  Negative for chest pain and leg swelling.  Gastrointestinal: Negative.  Negative for abdominal pain, blood in stool and melena.  Genitourinary: Negative.  Negative for dysuria.  Musculoskeletal: Negative.  Negative for back pain.  Skin: Negative.  Negative for rash.  Neurological: Negative.  Negative for dizziness, focal weakness, weakness and headaches.  Psychiatric/Behavioral: Negative.  The patient is not nervous/anxious.     As per HPI. Otherwise, a complete review of systems is negative.  PAST MEDICAL HISTORY: Past Medical History:  Diagnosis Date   Chicken pox    COPD (chronic obstructive  pulmonary disease) (HCC)    Hyperlipidemia    Measles     PAST SURGICAL HISTORY: Past Surgical History:  Procedure Laterality Date   TUBAL LIGATION  1978    FAMILY HISTORY: Family History  Family history unknown: Yes    ADVANCED DIRECTIVES (Y/N):  N  HEALTH MAINTENANCE: Social History   Tobacco Use   Smoking status: Every Day    Current packs/day: 1.00    Average packs/day: 1 pack/day for 48.0 years (48.0 ttl pk-yrs)    Types: Cigarettes   Smokeless tobacco: Never   Tobacco comments:    1 PPD - khj 09/20/2023  Vaping Use   Vaping status: Never Used  Substance Use Topics   Alcohol use: No    Alcohol/week: 0.0 standard drinks of alcohol   Drug use: No     Colonoscopy:  PAP:  Bone density:  Lipid panel:  No Known Allergies  Current Outpatient Medications  Medication Sig Dispense Refill   albuterol (PROVENTIL) (2.5 MG/3ML) 0.083% nebulizer solution Take 3 mLs (2.5 mg total) by nebulization every 6 (six) hours as needed for wheezing or shortness of breath. 75 mL 5   albuterol (VENTOLIN HFA) 108 (90 Base) MCG/ACT inhaler INHALE 2 PUFFS INTO THE LUNGS EVERY 6 HOURS AS NEEDED FOR WHEEZING OR SHORTNESS OF BREATH 54 g 0   aspirin EC 81 MG tablet Take 1 tablet (81 mg total) by mouth daily. Swallow whole. 30 tablet 12   azithromycin (ZITHROMAX) 250 MG tablet Take 1 tablet (250 mg total) by mouth daily. 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 45 tablet 2  Fluticasone-Umeclidin-Vilant (TRELEGY ELLIPTA) 200-62.5-25 MCG/ACT AEPB Inhale 1 puff into the lungs daily. 180 each 3   rifampin (RIFADIN) 150 MG capsule Take 3 capsules (450 mg total) by mouth daily. 90 capsule 2   No current facility-administered medications for this visit.    OBJECTIVE: Vitals:   11/18/23 1009  BP: (!) 133/55  Pulse: 69  Resp: 16  Temp: 98.4 F (36.9 C)  SpO2: 100%      Body mass index is 15.36 kg/m.    ECOG FS:0 - Asymptomatic  General: Thin, no acute distress. Eyes: Pink conjunctiva, anicteric sclera. HEENT: Normocephalic, moist mucous membranes. Lungs: No audible wheezing or coughing. Heart: Regular rate and rhythm. Abdomen: Soft, nontender, no obvious distention. Musculoskeletal: No edema, cyanosis, or clubbing. Neuro: Alert, answering all questions appropriately. Cranial nerves grossly intact. Skin: No rashes or petechiae noted. Psych: Normal affect.  LAB RESULTS:  Lab Results  Component Value Date   NA 136 11/07/2023   K 3.9 11/07/2023   CL 102 11/07/2023   CO2 23 11/07/2023   GLUCOSE 94 11/07/2023   BUN 8 11/07/2023   CREATININE 0.76 11/07/2023   CALCIUM 9.0 11/07/2023   PROT 7.4 11/07/2023   ALBUMIN 3.0 (L) 11/07/2023   AST 11 (L) 11/07/2023   ALT 10 11/07/2023   ALKPHOS 65 11/07/2023   BILITOT 0.4 11/07/2023   GFRNONAA >60 11/07/2023   GFRAA >60 09/25/2013    Lab Results  Component Value Date   WBC 9.1 11/07/2023   NEUTROABS 7.3 11/07/2023   HGB 11.1 (L) 11/07/2023   HCT 34.8 (L) 11/07/2023   MCV 94.8 11/07/2023   PLT 371 11/07/2023     STUDIES: NM PET Image Restage (PS) Skull Base to Thigh (F-18 FDG) Result Date: 11/18/2023 CLINICAL DATA:  Initial Treatment strategy for left lower lobe squamous cell carcinoma. History of chronic MAC infection. Rectal lesion on PET-CT. EXAM: NUCLEAR MEDICINE PET SKULL BASE TO THIGH TECHNIQUE: 5.33 mCi F-18 FDG was injected intravenously. Full-ring PET imaging was performed from the skull base to thigh after the radiotracer. CT data was obtained and used for attenuation correction and anatomic localization. Fasting blood glucose: 93 mg/dl COMPARISON:  PET-CT 16/05/9603.  Chest CT 09/26/2023 and 04/24/2023. FINDINGS: Mediastinal blood pool activity: SUV max 1.8 NECK: Persistent hypermetabolic left retroauricular soft tissue nodule measuring 1.4 cm on image 6/6 (SUV max 11.4), likely an  incidental deep parotid lesion. No other hypermetabolic cervical lymph nodes are identified. No suspicious activity identified within the pharyngeal mucosal space. Incidental CT findings: Bilateral carotid atherosclerosis. CHEST: There are no hypermetabolic mediastinal, hilar or axillary lymph nodes. Centrally cavitary left lower lobe mass measures 4.8 x 3.6 cm on image 59/6 and demonstrates an SUV max of 20.6. This compares with 4.1 x 3.7 cm and SUV max of 21.5 previously. The less well-defined chronic cavitary right apical lesion measures approximately 4.2 x 4.1 cm on image 27/6 and demonstrate low-level metabolic activity (SUV max 5.0). Other scattered pulmonary nodules are grossly stable, measuring up to 1.2 cm in the superior segment of the right lower lobe on image 40/6. These other nodules demonstrate no significant hypermetabolic activity. Incidental CT findings: Moderate centrilobular and paraseptal emphysema. As above, scattered pulmonary nodularity and biapical scarring. There is a new dependent small left pleural effusion. Atherosclerosis of the aorta, great vessels and coronary arteries noted. ABDOMEN/PELVIS: There is no hypermetabolic activity within the liver, adrenal glands, spleen or pancreas. There is no hypermetabolic nodal activity in the abdomen or pelvis. Previously demonstrated prominent metabolic  activity at the rectosigmoid junction has improved (SUV max 8.7, previously 11.8). Possible corresponding intraluminal nodule measuring up to 1.5 cm on image 116/6, as before. Incidental CT findings: Diffuse aortic and branch vessel atherosclerosis. Mild distal colonic diverticulosis. SKELETON: There is no hypermetabolic activity to suggest osseous metastatic disease. Incidental CT findings: Stable mild spondylosis. IMPRESSION: 1. No significant change in the size or metabolic activity within the dominant cavitary left lower lobe mass, consistent with known primary bronchogenic carcinoma. 2. The less  well-defined chronic cavitary right apical lesion demonstrates low-level metabolic activity and is stable in size. This is favored to reflect chronic MAC infection. Other scattered pulmonary nodules are grossly stable and demonstrate no significant hypermetabolic activity. 3. No evidence of metastatic disease in the abdomen or pelvis. 4. Persistent hypermetabolic left retroauricular soft tissue nodule, likely an incidental deep parotid lesion. 5. Previously demonstrated prominent metabolic activity at the rectosigmoid junction has improved. Possible corresponding intraluminal nodule, as before. Correlation with colonoscopy recommended if not previously performed. 6. Aortic Atherosclerosis (ICD10-I70.0) and Emphysema (ICD10-J43.9). Electronically Signed   By: Carey Bullocks M.D.   On: 11/18/2023 08:56   MR Brain W Wo Contrast Result Date: 11/18/2023 CLINICAL DATA:  New diagnosis of lung cancer EXAM: MRI HEAD WITHOUT AND WITH CONTRAST TECHNIQUE: Multiplanar, multiecho pulse sequences of the brain and surrounding structures were obtained without and with intravenous contrast. CONTRAST:  3mL GADAVIST GADOBUTROL 1 MMOL/ML IV SOLN COMPARISON:  Head CT 08/20/2023 FINDINGS: Brain: No enhancement or swelling to suggest metastatic disease. Negative for infarct, hemorrhage, hydrocephalus, mass, or collection. Vascular: Normal flow voids and vascular enhancements Skull and upper cervical spine: No focal marrow lesion Sinuses/Orbits: Chronic left maxillary sinusitis with atelectasis. Other: 15 mm enhancing mass in the upper left parotid, overlapping the stylomastoid foramen, but not as tubular as usually seen with nerve sheath tumor. This mass has been seen since 09/14/2021 PET CT IMPRESSION: 1. No evidence of intracranial metastasis. 2. 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 Mass notably over rides the left stylomastoid foramen Electronically Signed   By: Tiburcio Pea M.D.   On: 11/18/2023 08:33    DG Chest Port 1 View Result Date: 10/31/2023 CLINICAL DATA:  Status post percutaneous biopsy of cavitary mass in the lingula. EXAM: PORTABLE CHEST 1 VIEW COMPARISON:  Imaging during CT-guided biopsy earlier today. FINDINGS: The heart size and mediastinal contours are within normal limits. Severe emphysematous lung disease noted with bilateral hyperinflation. Rounded cavitary mass of the lingula measures up to approximately 4.9 cm in height. No pneumothorax identified by chest x-ray after the procedure. No significant pleural fluid. No pulmonary edema. The visualized skeletal structures are unremarkable. IMPRESSION: 1. No pneumothorax identified after percutaneous biopsy of cavitary mass in the lingula. 2. Severe emphysematous lung disease with bilateral hyperinflation. Electronically Signed   By: Irish Lack M.D.   On: 10/31/2023 13:09   CT LUNG MASS BIOPSY Result Date: 10/31/2023 CLINICAL DATA:  Enlarging cavitary mass of the lingula despite treatment for MAC. The patient presents for percutaneous biopsy of the lesion as she is not a candidate for bronchoscopy given severe lung disease and contraindication to undergoing general anesthesia. EXAM: CT GUIDED CORE BIOPSY OF LUNG MASS ANESTHESIA/SEDATION: Moderate (conscious) sedation was employed during this procedure. A total of Versed 1.0 mg and Fentanyl 50 mcg was administered intravenously. Moderate Sedation Time: 16 minutes. The patient's level of consciousness and vital signs were monitored continuously by radiology nursing throughout the procedure under my direct supervision. PROCEDURE:  The procedure risks, benefits, and alternatives were explained to the patient. Questions regarding the procedure were encouraged and answered. The patient understands and consents to the procedure. A time-out was performed prior to initiating the procedure. The left chest wall was prepped with chlorhexidine in a sterile fashion, and a sterile drape was applied covering  the operative field. A sterile gown and sterile gloves were used for the procedure. Local anesthesia was provided with 1% Lidocaine. The patient was placed in a supine position with the left side rolled up slightly and CT performed through the chest. After localizing a mass within the lingula, a 17 gauge trocar needle was advanced under CT guidance to the anterolateral edge of the mass. Two separate core biopsy samples were then obtained through the outer needle. One was sent for pathologic analysis and the other sent for fungal culture. A BioSentry plug was deposited at the pleural entry site via the trocar needle and the trocar needle removed. Additional CT was performed. RADIATION DOSE REDUCTION: This exam was performed according to the departmental dose-optimization program which includes automated exposure control, adjustment of the mA and/or kV according to patient size and/or use of iterative reconstruction technique. COMPLICATIONS: None FINDINGS: Initial CT demonstrates further slight enlargement of the cavitary mass in the lingula since prior CT on 09/26/2023. Maximum transverse diameter has increased from approximately 4.2 cm on the prior study up to 4.5 cm currently. Solid core biopsy samples were obtained. During biopsy, a tiny amount of air was noticed along the lateral margin of the mass which on completion appears to be extrapleural. A follow-up chest x-ray will be performed to ensure that there is no enlarging component of pneumothorax. IMPRESSION: CT-guided core biopsy of a cavitary mass in the lingula currently measuring up to 4.5 cm in maximum transverse diameter and demonstrating some enlargement since the most recent CT on 09/26/2023. Core biopsy samples were obtained from the solid anterior portion of the mass and sent for pathologic analysis and fungal culture. Electronically Signed   By: Irish Lack M.D.   On: 10/31/2023 12:26    ASSESSMENT: Clinical stage IIa squamous cell carcinoma  left lower lobe lung, MAC, hypermetabolic sigmoid colon lesion.  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  Repeat PET scan from November 12, 2023 reviewed independently and report as above essentially unchanged from previous confirming patient's stage.  MRI of the brain on November 12, 2023 did not reveal any evidence of metastatic disease.  Case discussed with pulmonary and it was agreed upon that although she has high risk, giving concurrent chemotherapy may be more detrimental and plan to proceed with XRT treatments only.  Will send for PD-L1 to see if immunotherapy may an option in the future if necessary.  No further interventions are needed.  Return to clinic at the end of XRT for further evaluation.   Hypermetabolic sigmoid lesion: Improved on most recent PET scan.  Previously, patient was given referral to GI for further evaluation and consideration of colonoscopy. CEA is within normal limits at 3.8.  Patient cannot undergo general anesthesia. MAC infection: Continue current antibiotics.  Follow-up and treatment per pulmonary. Parotid lesion: Patient incidentally noted to have a 1.5 cm mass in the left parotid on MRI of the brain.  Will continue active surveillance and repeat imaging in approximately 3 months.  Patient expressed understanding and was in agreement with this plan. She also understands that She can  call clinic at any time with any questions, concerns, or complaints.    Cancer Staging  Squamous cell carcinoma of lower lobe of left lung (HCC) Staging form: Lung, AJCC 8th Edition - Clinical stage from 11/08/2023: Stage IIA (cT2b, cN0, cM0) - Signed by Jeralyn Ruths, MD on 11/08/2023 Stage prefix: Initial diagnosis   Jeralyn Ruths, MD   11/18/2023 10:29 AM

## 2023-11-19 ENCOUNTER — Inpatient Hospital Stay

## 2023-11-19 ENCOUNTER — Encounter: Payer: Self-pay | Admitting: Radiation Oncology

## 2023-11-19 ENCOUNTER — Ambulatory Visit (INDEPENDENT_AMBULATORY_CARE_PROVIDER_SITE_OTHER): Payer: Medicare Other | Admitting: Pulmonary Disease

## 2023-11-19 ENCOUNTER — Ambulatory Visit
Admission: RE | Admit: 2023-11-19 | Discharge: 2023-11-19 | Disposition: A | Discharge status: Home / Self Care | Source: Ambulatory Visit | Attending: Radiation Oncology | Admitting: Radiation Oncology

## 2023-11-19 ENCOUNTER — Encounter: Payer: Self-pay | Admitting: *Deleted

## 2023-11-19 ENCOUNTER — Encounter: Payer: Self-pay | Admitting: Pulmonary Disease

## 2023-11-19 VITALS — BP 134/54 | HR 70 | Resp 16 | Ht 62.0 in | Wt 86.1 lb

## 2023-11-19 VITALS — BP 106/60 | HR 89 | Temp 97.8°F | Ht 62.0 in | Wt 83.0 lb

## 2023-11-19 DIAGNOSIS — Z510 Encounter for antineoplastic radiation therapy: Secondary | ICD-10-CM | POA: Insufficient documentation

## 2023-11-19 DIAGNOSIS — A319 Mycobacterial infection, unspecified: Secondary | ICD-10-CM | POA: Diagnosis not present

## 2023-11-19 DIAGNOSIS — C3492 Malignant neoplasm of unspecified part of left bronchus or lung: Secondary | ICD-10-CM | POA: Diagnosis not present

## 2023-11-19 DIAGNOSIS — R64 Cachexia: Secondary | ICD-10-CM | POA: Diagnosis not present

## 2023-11-19 DIAGNOSIS — C3432 Malignant neoplasm of lower lobe, left bronchus or lung: Secondary | ICD-10-CM

## 2023-11-19 DIAGNOSIS — F17210 Nicotine dependence, cigarettes, uncomplicated: Secondary | ICD-10-CM | POA: Diagnosis not present

## 2023-11-19 DIAGNOSIS — J449 Chronic obstructive pulmonary disease, unspecified: Secondary | ICD-10-CM | POA: Diagnosis not present

## 2023-11-19 LAB — MISCELLANEOUS TEST

## 2023-11-19 NOTE — Patient Instructions (Signed)
 VISIT SUMMARY:  You came in today for a follow-up regarding your lung tumor and MAC infection. We discussed your recent PET CT scan results, which showed no change from the previous scan and no spread of the tumor. We also talked about your ongoing treatment plan, including radiation therapy and smoking cessation.  YOUR PLAN:  -LUNG TUMOR: A lung tumor is an abnormal growth in the lung. Your recent PET CT scan showed that the tumor has not spread to the lymph nodes or other areas. We will proceed with radiation therapy for 10 days to help alleviate the pain caused by the tumor irritating the lining of your lung. You will be fitted for a radiation jacket to ensure precise targeting of the tumor, and the therapy will start next week.  -MYCOBACTERIUM AVIUM COMPLEX (MAC) INFECTION: MAC infection is a type of bacterial infection that can affect the lungs. Because chemotherapy could worsen this infection, we have decided to proceed with radiation therapy instead.  -SMOKING: You continue to smoke a pack of cigarettes per day. Quitting smoking is important for your overall health and could improve the outcomes of your treatment. We encourage you to consider smoking cessation options.  INSTRUCTIONS:  Please schedule a follow-up appointment in three months to monitor your response to radiation therapy and your overall health status.

## 2023-11-19 NOTE — Progress Notes (Signed)
 Subjective:    Patient ID: Alejandra Ortiz, female    DOB: 07/07/1951, 73 y.o.   MRN: [id number]  Patient Care Team: Lorre Munroe, NP as PCP - General (Internal Medicine) Tommie Sams, DO as Consulting Physician (Family Medicine) [facility], [facility] (Optometry) Glory Buff, RN as Oncology Nurse Navigator Carmina Miller, MD as Consulting Physician (Radiation Oncology) Jeralyn Ruths, MD as Consulting Physician (Oncology)  Chief Complaint  Patient presents with   Follow-up    BACKGROUND/INTERVAL:The patient is a 73 year old current smoker with significant pulmonary emphysema and MAC, who presents for follow-up on COPD and abnormal lung cancer screening scans.  She is currently maintained with Trelegy Ellipta 200, 1 puff daily and as needed albuterol. She was last seen here on 20 September 2023, this is a scheduled visit.  She has been following with ID as well for her MAC.  She is on MAC therapy and she has been tolerating this well.  She has been noted to have an enlarging lesion on the left lingula, IR requested repeat CT which was performed on 26 September 2023.  Patient is NOT a surgical candidate.  She had biopsy of the enlarging lung mass by IR on 31 October 2023.  This revealed squamous cell carcinoma.  Patient was referred to oncology.  She was last seen here on 17 October 2023, this a scheduled visit.  HPI Discussed the use of AI scribe software for clinical note transcription with the patient, who gave verbal consent to proceed.  History of Present Illness   The patient presents for follow-up regarding her squamous cell carcinoma of the lung, left and MAC infection.  She is currently undergoing evaluation and treatment planning for a lung tumor. A recent PET CT scan did not show much change from the prior scan and did not indicate any lymph node involvement or spread elsewhere. The only area that showed activity was the tumor itself. She is experiencing some pain in the  area of the tumor, suspect this is due to pleural irritation.  She states that she will be fitted for a jacket to ensure precise targeting of the radiation.  She is undergoing planning for radiation currently.  She has a history of MAC (Mycobacterium avium complex) infection, which has influenced the decision to pursue radiation therapy over chemotherapy due to concerns about exacerbating the infection.  She continues to smoke a pack of cigarettes per day and is using Trelegy, which is going well. Her breathing is currently stable with no new or worsening breathing difficulties.   Review of Systems A 10 point review of systems was performed and it is as noted above otherwise negative.   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) 01/17/2023   Aortic atherosclerosis (HCC) 08/17/2021   Hyperlipidemia 02/09/2016   COPD (chronic obstructive pulmonary disease) with emphysema (HCC) 02/07/2016    Social History   Tobacco Use   Smoking status: Every Day    Current packs/day: 1.00    Average packs/day: 1 pack/day for 48.0 years (48.0 ttl pk-yrs)    Types: Cigarettes   Smokeless tobacco: Never   Tobacco comments:    1 PPD - khj 09/20/2023  Substance Use Topics   Alcohol use: No    Alcohol/week: 0.0  standard drinks of alcohol    No Known Allergies  Current Meds  Medication Sig   albuterol (PROVENTIL) (2.5 MG/3ML) 0.083% nebulizer solution Take 3 mLs (2.5 mg total) by nebulization every 6 (six) hours as needed for wheezing or shortness of breath.   aspirin EC 81 MG tablet Take 1 tablet (81 mg total) by mouth daily. Swallow whole.   azithromycin (ZITHROMAX) 250 MG tablet Take 1 tablet (250 mg total) by mouth daily. For mycobacterium avium treatmentFor  mycobacterium avium treatment   ethambutol (MYAMBUTOL) 400 MG tablet Take 1.5 tablets (600 mg total) by mouth daily.   Fluticasone-Umeclidin-Vilant (TRELEGY ELLIPTA) 200-62.5-25 MCG/ACT AEPB Inhale 1 puff into the lungs daily.   rifampin (RIFADIN) 150 MG capsule Take 3 capsules (450 mg total) by mouth daily.   [DISCONTINUED] albuterol (VENTOLIN HFA) 108 (90 Base) MCG/ACT inhaler INHALE 2 PUFFS INTO THE LUNGS EVERY 6 HOURS AS NEEDED FOR WHEEZING OR SHORTNESS OF BREATH    Immunization History  Administered Date(s) Administered   Fluad Quad(high Dose 65+) 07/08/2020, 07/03/2022   Fluad Trivalent(High Dose 65+) 05/07/2023   Influenza Split 05/27/2014   Influenza, High Dose Seasonal PF 06/07/2017, 06/28/2018, 06/26/2019, 08/03/2021   Influenza-Unspecified 06/25/2016   PFIZER(Purple Top)SARS-COV-2 Vaccination 10/30/2019, 11/20/2019, 05/26/2020   Pneumococcal Conjugate-13 11/08/2014   Pneumococcal Polysaccharide-23 11/19/2016   Td 11/08/2014   Tdap 05/19/2018        Objective:     BP 106/60 (BP Location: Right Arm, Patient Position: Sitting, Cuff Size: Normal)   Pulse 89   Temp 97.8 F (36.6 C) (Temporal)   Ht 5\' 2"  (1.575 m)   Wt 83 lb (37.6 kg)   SpO2 96%   BMI 15.18 kg/m   SpO2: 96 %  GENERAL: Thin well-developed woman, no acute distress, fully ambulatory, no conversational dyspnea. HEAD: Normocephalic, atraumatic.  EYES: Pupils equal, round, reactive to light.  No scleral icterus.  MOUTH: Edentulous, oral mucosa moist.  No thrush. NECK: Supple. No thyromegaly. Trachea midline. No JVD.  No adenopathy. PULMONARY: Distant breath sounds bilaterally.  Coarse, no adventitious sounds. CARDIOVASCULAR: S1 and S2. Regular rate and rhythm.  No rubs, murmurs or gallops heard. ABDOMEN: Benign. MUSCULOSKELETAL: No joint deformity, no clubbing, no edema.  NEUROLOGIC: No overt focal deficit, no gait disturbance, speech is fluent. SKIN: Intact,warm,dry. PSYCH: Mood and behavior  normal.   Assessment & Plan:     ICD-10-CM   1. Squamous cell carcinoma lung, left (HCC)  C34.92     2. Mycobacteria, atypical  A31.9     3. Stage 3 severe COPD by GOLD classification (HCC)  J44.9     4. 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 Tobacco dependence due to cigarettes  F17.210       Discussion:    Squamous cell carcinoma of the lung Most recent PET CT scan shows no lymph node involvement or metastasis, indicating localized disease. The tumor causes pleuritic pain by irritating the lung lining. Radiation therapy is chosen over chemotherapy due to the presence of Mycobacterium avium complex (MAC) infection, which could be exacerbated by chemotherapy. Radiation is expected to alleviate pain and is considered safer in this context. - Proceed with radiation therapy for 10 days per Dr. Rushie Chestnut. - Fitted for a radiation jacket to ensure precise targeting of the tumor. - Schedule radiation therapy to start the following week.  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  Tobacco dependence due to cigarettes Continues to smoke a pack of cigarettes per day. Smoking cessation is important for overall health and could improve treatment outcomes.  Patient was counseled, total counseling time 3 to 5 minutes.  Follow-up Monitoring for response to radiation therapy and overall health status is necessary. - Schedule follow-up appointment in three months.     Advised if symptoms do not improve or worsen, to please contact office for sooner follow up or seek emergency care.    I spent 30 minutes of dedicated to the care of this patient on the date of this encounter to include pre-visit review of records, face-to-face time with the patient discussing conditions above, post visit ordering of testing, clinical documentation with the  electronic health record, making appropriate referrals as documented, and communicating necessary findings to members of the patients care team.     C. Danice Goltz, MD Advanced Bronchoscopy PCCM  [facility]    *This note was generated using voice recognition software/Dragon and/or AI transcription program.  Despite best efforts to proofread, errors can occur which can change the meaning. Any transcriptional errors that result from this process are unintentional and may not be fully corrected at the time of dictation.

## 2023-11-19 NOTE — Consult Note (Signed)
 NEW PATIENT EVALUATION  Name: Alejandra Ortiz  MRN: [id number]  Date:   11/19/2023     DOB: 15-Feb-1951   This 73 y.o. female patient presents to the clinic for initial evaluation of stage IIa (T2b N0 M0) non-small cell lung cancer of the left lower lobe favoring squamous cell carcinoma.  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   PREVIOUS INVESTIGATIONS:  CT scan PET CT scan reviewed Clinical notes reviewed Pathology reports reviewed  HPI: Patient is a 73 year old female who is followed by interval enlargement of the lingular mass worry some for pulmonary neoplasm.  PET scan back in December showed a 4.1 cm cavitary mass in the anterior left lower lobe concerning for primary bronchogenic carcinoma.  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  She underwent a CT-guided biopsy of the lung mass which was positive for non-small cell lung cancer favoring squamous cell carcinoma.  MRI brain shows no evidence of intracranial metastasis.  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  MRI PET scan yesterday wasPerformed showing no significant change in the size of metabolic activity within the dominant cavity and left lower lobe the less well-defined chronic cavity in the right apical lesion demonstrates low-level metabolic activity and is stable in size favoring to reflect chronic MAC infection.  She does have a slight cough no hemoptysis.  No chest tightness or change in her pulmonary status.  She has been declined for systemic chemotherapy based on her medical comorbidities and the MAC.  She is now referred to ration collagen for consideration of treatment.  PLANNED TREATMENT REGIMEN: Hypofractionated MRI IMRT treatment to the dominant left  lower lobe squamous cell carcinoma  PAST MEDICAL HISTORY:  has a past medical history of Chicken pox, COPD (chronic obstructive pulmonary disease) (HCC), Hyperlipidemia, and Measles.    PAST SURGICAL HISTORY:  Past Surgical History:  Procedure Laterality Date   TUBAL LIGATION  1978    FAMILY HISTORY: Family history is unknown by patient.  SOCIAL HISTORY:  reports that she has been smoking cigarettes. She has a 48 pack-year smoking history. She has never used smokeless tobacco. She reports that she does not drink alcohol and does not use drugs.  ALLERGIES: Patient has no known allergies.  MEDICATIONS:  Current Outpatient Medications  Medication Sig Dispense Refill   albuterol (PROVENTIL) (2.5 MG/3ML) 0.083% nebulizer solution Take 3 mLs (2.5 mg total) by nebulization every 6 (six) hours as needed for wheezing or shortness of breath. 75 mL 5   albuterol (VENTOLIN HFA) 108 (90 Base) MCG/ACT inhaler INHALE 2 PUFFS INTO THE LUNGS EVERY 6 HOURS AS NEEDED FOR WHEEZING OR SHORTNESS OF BREATH 54 g 0   aspirin EC 81 MG tablet Take 1 tablet (81 mg total) by mouth daily. Swallow whole. 30 tablet 12   azithromycin (ZITHROMAX) 250 MG tablet Take 1 tablet (250 mg total) by mouth daily. 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 45 tablet 2   Fluticasone-Umeclidin-Vilant (TRELEGY ELLIPTA) 200-62.5-25 MCG/ACT AEPB Inhale 1 puff into the lungs daily. 180 each 3   rifampin (RIFADIN) 150 MG capsule Take 3 capsules (450 mg total)  by mouth daily. 90 capsule 2   No current facility-administered medications for this encounter.    ECOG PERFORMANCE STATUS:  0 - Asymptomatic  REVIEW OF SYSTEMS: Patient does have a history of COPD hyperlipidemia previous history of chickenpox and measles MAC Patient denies any weight loss, fatigue, weakness, fever, chills or night sweats. Patient denies any  loss of vision, blurred vision. Patient denies any ringing  of the ears or hearing loss. No irregular heartbeat. Patient denies heart murmur or history of fainting. Patient denies any chest pain or pain radiating to her upper extremities. Patient denies any shortness of breath, difficulty breathing at night, cough or hemoptysis. Patient denies any swelling in the lower legs. Patient denies any nausea vomiting, vomiting of blood, or coffee ground material in the vomitus. Patient denies any stomach pain. Patient states has had normal bowel movements no significant constipation or diarrhea. Patient denies any dysuria, hematuria or significant nocturia. Patient denies any problems walking, swelling in the joints or loss of balance. Patient denies any skin changes, loss of hair or loss of weight. Patient denies any excessive worrying or anxiety or significant depression. Patient denies any problems with insomnia. Patient denies excessive thirst, polyuria, polydipsia. Patient denies any swollen glands, patient denies easy bruising or easy bleeding. Patient denies any recent infections, allergies or URI. Patient "s visual fields have not changed significantly in recent time.   PHYSICAL EXAM: BP (!) 134/54   Pulse 70   Resp 16   Ht 5\' 2"  (1.575 m)   Wt 86 lb 1.6 oz (39.1 kg)   BMI 15.75 kg/m  Frail-appearing elderly female in NAD somewhat cachectic well-developed well-nourished patient in NAD. HEENT reveals PERLA, EOMI, discs not visualized.  Oral cavity is clear. No oral mucosal lesions are identified. Neck is clear without evidence of cervical or supraclavicular adenopathy. Lungs are clear to A&P. Cardiac examination is essentially unremarkable with regular rate and rhythm without murmur rub or thrill. Abdomen is benign with no organomegaly or masses noted. Motor sensory and DTR levels are equal and symmetric in the upper and lower extremities. Cranial nerves II through XII are grossly intact. Proprioception is  intact. No peripheral adenopathy or edema is identified. No motor or sensory levels are noted. Crude visual fields are within normal range.  LABORATORY DATA: Pathology reports reviewed    RADIOLOGY RESULTS: CT scans and PET CT scans reviewed compatible with above-stated findings   IMPRESSION: Squamous cell carcinoma of the left lower lobe stage IIa in 73 year old female  PLAN: At this time of offered hypofractionated course of radiation therapy to the cavitary mass.  Will plan on delivering 60 Gray in 10 fractions using IMRT treatment planning and delivery to spare critical structures such as normal lung volume heart esophagus and spinal cord.  Risks and benefits of treatment including development of a cough possible radiation esophagitis fatigue alteration of blood counts and skin reaction all were described in detail to the patient.  I have personally set up and ordered CT simulation.  Patient has consented to treatment.  I would like to take this opportunity to thank you for allowing me to participate in the care of your patient.Carmina Miller, MD

## 2023-11-19 NOTE — Progress Notes (Signed)
 Met with patient during initial consult with Dr. Rushie Chestnut to discuss radiation treatment. All questions answered during visit. Reviewed upcoming appts. Informed that will collect blood sample for [facility] today. Blood collected by lab staff, placed on ice, and sent out via [employer] today. Nothing further needed at this time. Instructed to call with any questions or needs. Pt verbalized understanding.

## 2023-11-21 ENCOUNTER — Encounter: Payer: Self-pay | Admitting: *Deleted

## 2023-11-21 ENCOUNTER — Ambulatory Visit
Admission: RE | Admit: 2023-11-21 | Discharge: 2023-11-21 | Discharge status: Home / Self Care | Source: Ambulatory Visit | Attending: Radiation Oncology | Admitting: Radiation Oncology

## 2023-11-21 DIAGNOSIS — Z510 Encounter for antineoplastic radiation therapy: Secondary | ICD-10-CM | POA: Diagnosis not present

## 2023-11-27 ENCOUNTER — Encounter: Payer: Self-pay | Admitting: Oncology

## 2023-11-28 DIAGNOSIS — Z510 Encounter for antineoplastic radiation therapy: Secondary | ICD-10-CM | POA: Diagnosis present

## 2023-11-28 DIAGNOSIS — F17210 Nicotine dependence, cigarettes, uncomplicated: Secondary | ICD-10-CM | POA: Insufficient documentation

## 2023-11-28 DIAGNOSIS — C3432 Malignant neoplasm of lower lobe, left bronchus or lung: Secondary | ICD-10-CM | POA: Diagnosis present

## 2023-12-02 ENCOUNTER — Encounter: Payer: Self-pay | Admitting: Internal Medicine

## 2023-12-02 ENCOUNTER — Ambulatory Visit (INDEPENDENT_AMBULATORY_CARE_PROVIDER_SITE_OTHER): Payer: Self-pay | Admitting: Internal Medicine

## 2023-12-02 VITALS — BP 118/62 | HR 83 | Ht 62.0 in | Wt 82.6 lb

## 2023-12-02 DIAGNOSIS — Z681 Body mass index (BMI) 19 or less, adult: Secondary | ICD-10-CM | POA: Insufficient documentation

## 2023-12-02 DIAGNOSIS — R739 Hyperglycemia, unspecified: Secondary | ICD-10-CM | POA: Diagnosis not present

## 2023-12-02 DIAGNOSIS — I700 Atherosclerosis of aorta: Secondary | ICD-10-CM | POA: Diagnosis not present

## 2023-12-02 DIAGNOSIS — E7800 Pure hypercholesterolemia, unspecified: Secondary | ICD-10-CM

## 2023-12-02 DIAGNOSIS — D508 Other iron deficiency anemias: Secondary | ICD-10-CM | POA: Diagnosis not present

## 2023-12-02 DIAGNOSIS — A310 Pulmonary mycobacterial infection: Secondary | ICD-10-CM

## 2023-12-02 DIAGNOSIS — R636 Underweight: Secondary | ICD-10-CM

## 2023-12-02 DIAGNOSIS — J432 Centrilobular emphysema: Secondary | ICD-10-CM

## 2023-12-02 DIAGNOSIS — C3432 Malignant neoplasm of lower lobe, left bronchus or lung: Secondary | ICD-10-CM

## 2023-12-02 LAB — FUNGUS CULTURE WITH STAIN

## 2023-12-02 LAB — FUNGUS CULTURE RESULT

## 2023-12-02 LAB — FUNGAL ORGANISM REFLEX

## 2023-12-02 MED ORDER — ALBUTEROL SULFATE HFA 108 (90 BASE) MCG/ACT IN AERS: 2.0000 | INHALATION_SPRAY | Freq: Four times a day (QID) | RESPIRATORY_TRACT | 0 refills | Status: DC | PRN

## 2023-12-02 NOTE — Assessment & Plan Note (Signed)
CBC and iron panel today 

## 2023-12-02 NOTE — Assessment & Plan Note (Signed)
 Continue trelegy and albuterol Encourage smoking cessation She will continue to follow with pulmonology

## 2023-12-02 NOTE — Patient Instructions (Signed)
 Iron-Rich Diet  Iron is a mineral that helps your body produce hemoglobin. Hemoglobin is a protein in red blood cells that carries oxygen to your body's tissues. Eating too little iron may cause you to feel weak and tired, and it can increase your risk of infection. Iron is naturally found in many foods, and many foods have iron added to them (are iron-fortified). You may need to follow an iron-rich diet if you do not have enough iron in your body due to certain medical conditions. The amount of iron that you need each day depends on your age, your sex, and any medical conditions you have. Follow instructions from your health care provider or a dietitian about how much iron you should eat each day. What are tips for following this plan? Reading food labels Check food labels to see how many milligrams (mg) of iron are in each serving. Cooking Cook foods in pots and pans that are made from iron. Take these steps to make it easier for your body to absorb iron from certain foods: Soak beans overnight before cooking. Soak whole grains overnight and drain them before using. Ferment flours before baking, such as by using yeast in bread dough. Meal planning When you eat foods that contain iron, you should eat them with foods that are high in vitamin C. These include oranges, peppers, tomatoes, potatoes, and mangoes. Vitamin C helps your body absorb iron. Certain foods and drinks prevent your body from absorbing iron properly. Avoid eating these foods in the same meal as iron-rich foods or with iron supplements. These foods include: Coffee, black tea, and red wine. Milk, dairy products, and foods that are high in calcium. Beans and soybeans. Whole grains. General information Take iron supplements only as told by your health care provider. An overdose of iron can be life-threatening. If you were prescribed iron supplements, take them with orange juice or a vitamin C supplement. When you eat  iron-fortified foods or take an iron supplement, you should also eat foods that naturally contain iron, such as meat, poultry, and fish. Eating naturally iron-rich foods helps your body absorb the iron that is added to other foods or contained in a supplement. Iron from animal sources is better absorbed than iron from plant sources. What foods should I eat? Fruits Prunes. Raisins. Eat fruits high in vitamin C, such as oranges, grapefruits, and strawberries, with iron-rich foods. Vegetables Spinach (cooked). Green peas. Broccoli. Fermented vegetables. Eat vegetables high in vitamin C, such as leafy greens, potatoes, bell peppers, and tomatoes, with iron-rich foods. Grains Iron-fortified breakfast cereal. Iron-fortified whole-wheat bread. Enriched rice. Sprouted grains. Meats and other proteins Beef liver. Beef. [place]. Chicken. Oysters. Shrimp. Tuna. Sardines. Chickpeas. Nuts. Tofu. Pumpkin seeds. Beverages Tomato juice. Fresh orange juice. Prune juice. Hibiscus tea. Iron-fortified instant breakfast shakes. Sweets and desserts Blackstrap molasses. Seasonings and condiments Tahini. Fermented soy sauce. Other foods Wheat germ. The items listed above may not be a complete list of recommended foods and beverages. Contact a dietitian for more information. What foods should I limit? These are foods that should be limited while eating iron-rich foods as they can reduce the absorption of iron in your body. Grains Whole grains. Bran cereal. Bran flour. Meats and other proteins Soybeans. Products made from soy protein. Black beans. Lentils. Mung beans. Split peas. Dairy Milk. Cream. Cheese. Yogurt. Cottage cheese. Beverages Coffee. Black tea. Red wine. Sweets and desserts Cocoa. Chocolate. Ice cream. Seasonings and condiments Basil. Oregano. Large amounts of parsley. The items listed  above may not be a complete list of foods and beverages you should limit. Contact a dietitian for more  information. Summary Iron is a mineral that helps your body produce hemoglobin. Hemoglobin is a protein in red blood cells that carries oxygen to your body's tissues. Iron is naturally found in many foods, and many foods have iron added to them (are iron-fortified). When you eat foods that contain iron, you should eat them with foods that are high in vitamin C. Vitamin C helps your body absorb iron. Certain foods and drinks prevent your body from absorbing iron properly, such as whole grains and dairy products. You should avoid eating these foods in the same meal as iron-rich foods or with iron supplements. This information is not intended to replace advice given to you by your health care provider. Make sure you discuss any questions you have with your health care provider. Document Revised: 07/25/2020 Document Reviewed: 07/25/2020 Elsevier Patient Education  2024 [employer].

## 2023-12-02 NOTE — Assessment & Plan Note (Signed)
 Continue antibiotics per ID.

## 2023-12-02 NOTE — Assessment & Plan Note (Signed)
 Starting radiation tomorrow She will continue to follow with oncology

## 2023-12-02 NOTE — Assessment & Plan Note (Signed)
 C-Met and lipid profile today Encouraged her to consume a low-fat diet

## 2023-12-02 NOTE — Progress Notes (Signed)
 Subjective:    Patient ID: Alejandra Ortiz, female    DOB: 11-26-1950, 73 y.o.   MRN: [id number]  HPI  Patient presents to clinic today for follow-up of chronic conditions.  COPD with lung nodules, MAI: She reports chronic cough but she denies shortness of breath.  She is using trelegy and albuterol as prescribed. She is also taking azithromycin, rifampin and ethambutol as prescribed. There are no PFTs on file.  She does continue to smoke.  She follows with pulmonology, infectious disease.  HLD with aortic atherosclerosis: Her last LDL was 107, triglycerides 101, 05/2023. She is not taking any cholesterol-lowering medication but is taking aspirin.  She does not consume a low-fat diet.  Lung cancer: She starts radiation tomorrow. She is unable to do chemo. She follows with oncology  Anemia: Her last H/H was 11.1/34.8, 10/2023.  She is not taking any oral iron at this time.  She follows with oncology.  Review of Systems     Past Medical History:  Diagnosis Date   Chicken pox    COPD (chronic obstructive pulmonary disease) (HCC)    Hyperlipidemia    Measles     Current Outpatient Medications  Medication Sig Dispense Refill   albuterol (PROVENTIL) (2.5 MG/3ML) 0.083% nebulizer solution Take 3 mLs (2.5 mg total) by nebulization every 6 (six) hours as needed for wheezing or shortness of breath. 75 mL 5   albuterol (VENTOLIN HFA) 108 (90 Base) MCG/ACT inhaler INHALE 2 PUFFS INTO THE LUNGS EVERY 6 HOURS AS NEEDED FOR WHEEZING OR SHORTNESS OF BREATH 54 g 0   aspirin EC 81 MG tablet Take 1 tablet (81 mg total) by mouth daily. Swallow whole. 30 tablet 12   azithromycin (ZITHROMAX) 250 MG tablet Take 1 tablet (250 mg total) by mouth daily. 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 45 tablet 2   Fluticasone-Umeclidin-Vilant (TRELEGY ELLIPTA) 200-62.5-25 MCG/ACT AEPB Inhale  1 puff into the lungs daily. 180 each 3   rifampin (RIFADIN) 150 MG capsule Take 3 capsules (450 mg total) by mouth daily. 90 capsule 2   No current facility-administered medications for this visit.    No Known Allergies  Family History  Family history unknown: Yes    Social History   Socioeconomic History   Marital status: Married    Spouse name: Alexya Mcdaris   Number of children: 5   Years of education: Not on file   Highest education level: Not on file  Occupational History   Occupation: Knit    Comment: [employer]  Tobacco Use   Smoking status: Every Day    Current packs/day: 1.00    Average packs/day: 1 pack/day for 48.0 years (48.0 ttl pk-yrs)    Types: Cigarettes   Smokeless tobacco: Never   Tobacco comments:    1 PPD - khj 09/20/2023  Vaping Use   Vaping status: Never Used  Substance and Sexual Activity   Alcohol use: No    Alcohol/week: 0.0 standard drinks of alcohol   Drug use: No   Sexual activity: Never    Birth control/protection: Surgical  Other Topics Concern   Not on file  Social History Narrative   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   Lives with husband and 5 children with 12 grandchildren, 4 great-grandchildren   1 dog lives inside    Associates Degree   Right hand dominant  Enjoys reading    Social Drivers of Corporate investment banker Strain: Low Risk  (08/30/2023)   Overall Financial Resource Strain (CARDIA)    Difficulty of Paying Living Expenses: Not hard at all  Food Insecurity: No Food Insecurity (08/30/2023)   Hunger Vital Sign    Worried About Running Out of Food in the Last Year: Never true    Ran Out of Food in the Last Year: Never true  Transportation Needs: No Transportation Needs (08/30/2023)   PRAPARE - Administrator, Civil Service (Medical): No    Lack of Transportation (Non-Medical): No  Physical Activity: Insufficiently Active (08/30/2023)   Exercise Vital Sign    Days of Exercise per Week: 3 days     Minutes of Exercise per Session: 30 min  Stress: No Stress Concern Present (08/30/2023)   [employer] of Occupational Health - Occupational Stress Questionnaire    Feeling of Stress : Not at all  Social Connections: Moderately Isolated (08/30/2023)   Social Connection and Isolation Panel [NHANES]    Frequency of Communication with Friends and Family: Once a week    Frequency of Social Gatherings with Friends and Family: More than three times a week    Attends Religious Services: Never    Database administrator or Organizations: No    Attends Banker Meetings: Never    Marital Status: Married  Catering manager Violence: Not At Risk (08/30/2023)   Humiliation, Afraid, Rape, and Kick questionnaire    Fear of Current or Ex-Partner: No    Emotionally Abused: No    Physically Abused: No    Sexually Abused: No     Constitutional: Denies fever, malaise, fatigue, headache or abrupt weight changes.  HEENT: Denies eye pain, eye redness, ear pain, ringing in the ears, wax buildup, runny nose, nasal congestion, bloody nose, or sore throat. Respiratory: Patient reports chronic cough.  Denies difficulty breathing, shortness of breath.   Cardiovascular: Denies chest pain, chest tightness, palpitations or swelling in the hands or feet.  Gastrointestinal: Denies abdominal pain, bloating, constipation, diarrhea or blood in the stool.  GU: Denies urgency, frequency, pain with urination, burning sensation, blood in urine, odor or discharge. Musculoskeletal: Denies decrease in range of motion, difficulty with gait, muscle pain or joint pain and swelling.  Skin: Denies redness, rashes, lesions or ulcercations.  Neurological: Denies dizziness, difficulty with memory, difficulty with speech or problems with balance and coordination.  Psych: Denies anxiety, depression, SI/HI.  No other specific complaints in a complete review of systems (except as listed in HPI above).  Objective:    Physical Exam  BP 118/62 (BP Location: Left Arm, Patient Position: Sitting, Cuff Size: Normal)   Pulse 83   Ht 5\' 2"  (1.575 m)   Wt 82 lb 9.6 oz (37.5 kg)   SpO2 97%   BMI 15.11 kg/m    Wt Readings from Last 3 Encounters:  11/19/23 86 lb 1.6 oz (39.1 kg)  11/19/23 83 lb (37.6 kg)  11/18/23 84 lb (38.1 kg)    General: Appears her stated age, underweight, in NAD. Skin: Warm, dry and intact.  Cardiovascular: Normal rate and rhythm. S1,S2 noted.  No murmur, rubs or gallops noted. No JVD or BLE edema. No carotid bruits noted. Pulmonary/Chest: Normal effort and diminished breath sounds. No respiratory distress. No wheezes, rales or ronchi noted.  Musculoskeletal: Muscle wasting noted of upper and lower extremities.  No difficulty with gait.  Neurological: Alert and oriented. Coordination normal.  Psychiatric: Mood and affect normal. Behavior is normal. Judgment and thought content normal.     BMET    Component Value Date/Time   NA 136 11/07/2023 1556   NA 137 09/25/2013 0950   K 3.9 11/07/2023 1556   K 3.6 09/25/2013 0950   CL 102 11/07/2023 1556   CL 106 09/25/2013 0950   CO2 23 11/07/2023 1556   CO2 27 09/25/2013 0950   GLUCOSE 94 11/07/2023 1556   GLUCOSE 103 (H) 09/25/2013 0950   BUN 8 11/07/2023 1556   BUN 14 09/25/2013 0950   CREATININE 0.76 11/07/2023 1556   CREATININE 0.83 06/03/2023 1435   CALCIUM 9.0 11/07/2023 1556   CALCIUM 8.9 09/25/2013 0950   GFRNONAA >60 11/07/2023 1556   GFRNONAA 57 (L) 09/25/2013 0950   GFRAA >60 09/25/2013 0950    Lipid Panel     Component Value Date/Time   CHOL 185 06/03/2023 1435   TRIG 101 06/03/2023 1435   HDL 59 06/03/2023 1435   CHOLHDL 3.1 06/03/2023 1435   VLDL 12.0 03/17/2019 1401   LDLCALC 107 (H) 06/03/2023 1435    CBC    Component Value Date/Time   WBC 9.1 11/07/2023 1556   RBC 3.67 (L) 11/07/2023 1556   HGB 11.1 (L) 11/07/2023 1556   HGB 12.3 09/25/2013 0950   HCT 34.8 (L) 11/07/2023 1556   HCT 35.4  09/25/2013 0950   PLT 371 11/07/2023 1556   PLT 183 09/25/2013 0950   MCV 94.8 11/07/2023 1556   MCV 98 09/25/2013 0950   MCH 30.2 11/07/2023 1556   MCHC 31.9 11/07/2023 1556   RDW 13.2 11/07/2023 1556   RDW 12.4 09/25/2013 0950   LYMPHSABS 1.1 11/07/2023 1556   MONOABS 0.5 11/07/2023 1556   EOSABS 0.1 11/07/2023 1556   BASOSABS 0.0 11/07/2023 1556    Hgb A1C Lab Results  Component Value Date   HGBA1C 5.4 11/17/2014            Assessment & Plan:    RTC in 6 months, follow-up chronic conditions Nicki Reaper, NP

## 2023-12-02 NOTE — Assessment & Plan Note (Signed)
 C-Met and lipid profile today Discussed LDL goal <70 Encouraged her to consume a low-fat diet Continue aspirin

## 2023-12-02 NOTE — Assessment & Plan Note (Signed)
Encouraged high-protein, high-calorie diet

## 2023-12-03 ENCOUNTER — Encounter: Payer: Self-pay | Admitting: *Deleted

## 2023-12-03 ENCOUNTER — Encounter: Payer: Self-pay | Admitting: Pulmonary Disease

## 2023-12-03 ENCOUNTER — Ambulatory Visit
Admission: RE | Admit: 2023-12-03 | Discharge: 2023-12-03 | Disposition: A | Discharge status: Home / Self Care | Source: Ambulatory Visit | Attending: Radiation Oncology | Admitting: Radiation Oncology

## 2023-12-03 ENCOUNTER — Encounter: Payer: Self-pay | Admitting: Internal Medicine

## 2023-12-03 ENCOUNTER — Other Ambulatory Visit: Payer: Self-pay

## 2023-12-03 DIAGNOSIS — Z510 Encounter for antineoplastic radiation therapy: Secondary | ICD-10-CM | POA: Diagnosis not present

## 2023-12-03 LAB — COMPREHENSIVE METABOLIC PANEL WITH GFR
AG Ratio: 0.9 (calc) — ABNORMAL LOW (ref 1.0–2.5)
ALT: 5 U/L — ABNORMAL LOW (ref 6–29)
AST: 7 U/L — ABNORMAL LOW (ref 10–35)
Albumin: 2.9 g/dL — ABNORMAL LOW (ref 3.6–5.1)
Alkaline phosphatase (APISO): 73 U/L (ref 37–153)
BUN: 9 mg/dL (ref 7–25)
CO2: 26 mmol/L (ref 20–32)
Calcium: 8.7 mg/dL (ref 8.6–10.4)
Chloride: 104 mmol/L (ref 98–110)
Creat: 0.65 mg/dL (ref 0.60–1.00)
Globulin: 3.3 g/dL (ref 1.9–3.7)
Glucose, Bld: 105 mg/dL (ref 65–139)
Potassium: 3.7 mmol/L (ref 3.5–5.3)
Sodium: 137 mmol/L (ref 135–146)
Total Bilirubin: 0.2 mg/dL (ref 0.2–1.2)
Total Protein: 6.2 g/dL (ref 6.1–8.1)
eGFR: 92 mL/min/{1.73_m2} (ref >=60)

## 2023-12-03 LAB — IRON,TIBC AND FERRITIN PANEL
%SAT: 8 % — ABNORMAL LOW (ref 16–45)
Ferritin: 64 ng/mL (ref 16–288)
Iron: 14 ug/dL — ABNORMAL LOW (ref 45–160)
TIBC: 186 ug/dL — ABNORMAL LOW (ref 250–450)

## 2023-12-03 LAB — RAD ONC ARIA SESSION SUMMARY
Course Elapsed Days: 0
Plan Fractions Treated to Date: 1
Plan Prescribed Dose Per Fraction: 6 Gy
Plan Total Fractions Prescribed: 10
Plan Total Prescribed Dose: 60 Gy
Reference Point Dosage Given to Date: 6 Gy
Reference Point Session Dosage Given: 6 Gy
Session Number: 1

## 2023-12-03 LAB — CBC
HCT: 29.8 % — ABNORMAL LOW (ref 35.0–45.0)
Hemoglobin: 10 g/dL — ABNORMAL LOW (ref 11.7–15.5)
MCH: 31.1 pg (ref 27.0–33.0)
MCHC: 33.6 g/dL (ref 32.0–36.0)
MCV: 92.5 fL (ref 80.0–100.0)
MPV: 9.7 fL (ref 7.5–12.5)
Platelets: 376 10*3/uL (ref 140–400)
RBC: 3.22 10*6/uL — ABNORMAL LOW (ref 3.80–5.10)
RDW: 12.1 % (ref 11.0–15.0)
WBC: 9.6 10*3/uL (ref 3.8–10.8)

## 2023-12-03 LAB — LIPID PANEL
Cholesterol: 157 mg/dL (ref <200)
HDL: 49 mg/dL — ABNORMAL LOW (ref >=50)
LDL Cholesterol (Calc): 91 mg/dL
Non-HDL Cholesterol (Calc): 108 mg/dL (ref <130)
Total CHOL/HDL Ratio: 3.2 (calc) (ref <5.0)
Triglycerides: 80 mg/dL (ref <150)

## 2023-12-03 LAB — HEMOGLOBIN A1C
Hgb A1c MFr Bld: 5.7 %{Hb} — ABNORMAL HIGH (ref <5.7)
Mean Plasma Glucose: 117 mg/dL
eAG (mmol/L): 6.5 mmol/L

## 2023-12-04 ENCOUNTER — Ambulatory Visit
Admission: RE | Admit: 2023-12-04 | Discharge: 2023-12-04 | Disposition: A | Discharge status: Home / Self Care | Source: Ambulatory Visit | Attending: Radiation Oncology | Admitting: Radiation Oncology

## 2023-12-04 ENCOUNTER — Other Ambulatory Visit: Payer: Self-pay

## 2023-12-04 DIAGNOSIS — Z510 Encounter for antineoplastic radiation therapy: Secondary | ICD-10-CM | POA: Diagnosis not present

## 2023-12-04 LAB — RAD ONC ARIA SESSION SUMMARY
Course Elapsed Days: 1
Plan Fractions Treated to Date: 2
Plan Prescribed Dose Per Fraction: 6 Gy
Plan Total Fractions Prescribed: 10
Plan Total Prescribed Dose: 60 Gy
Reference Point Dosage Given to Date: 12 Gy
Reference Point Session Dosage Given: 6 Gy
Session Number: 2

## 2023-12-05 ENCOUNTER — Ambulatory Visit
Admission: RE | Admit: 2023-12-05 | Discharge: 2023-12-05 | Disposition: A | Discharge status: Home / Self Care | Source: Ambulatory Visit | Attending: Radiation Oncology | Admitting: Radiation Oncology

## 2023-12-05 ENCOUNTER — Other Ambulatory Visit: Payer: Self-pay

## 2023-12-05 DIAGNOSIS — Z510 Encounter for antineoplastic radiation therapy: Secondary | ICD-10-CM | POA: Diagnosis not present

## 2023-12-05 LAB — RAD ONC ARIA SESSION SUMMARY
Course Elapsed Days: 2
Plan Fractions Treated to Date: 3
Plan Prescribed Dose Per Fraction: 6 Gy
Plan Total Fractions Prescribed: 10
Plan Total Prescribed Dose: 60 Gy
Reference Point Dosage Given to Date: 18 Gy
Reference Point Session Dosage Given: 6 Gy
Session Number: 3

## 2023-12-06 ENCOUNTER — Ambulatory Visit
Admission: RE | Admit: 2023-12-06 | Discharge: 2023-12-06 | Disposition: A | Discharge status: Home / Self Care | Source: Ambulatory Visit | Attending: Radiation Oncology | Admitting: Radiation Oncology

## 2023-12-06 ENCOUNTER — Other Ambulatory Visit: Payer: Self-pay

## 2023-12-06 DIAGNOSIS — Z510 Encounter for antineoplastic radiation therapy: Secondary | ICD-10-CM | POA: Diagnosis not present

## 2023-12-06 LAB — RAD ONC ARIA SESSION SUMMARY
Course Elapsed Days: 3
Plan Fractions Treated to Date: 4
Plan Prescribed Dose Per Fraction: 6 Gy
Plan Total Fractions Prescribed: 10
Plan Total Prescribed Dose: 60 Gy
Reference Point Dosage Given to Date: 24 Gy
Reference Point Session Dosage Given: 6 Gy
Session Number: 4

## 2023-12-09 ENCOUNTER — Other Ambulatory Visit: Payer: Self-pay

## 2023-12-09 ENCOUNTER — Ambulatory Visit
Admission: RE | Admit: 2023-12-09 | Discharge: 2023-12-09 | Disposition: A | Discharge status: Home / Self Care | Source: Ambulatory Visit | Attending: Radiation Oncology | Admitting: Radiation Oncology

## 2023-12-09 DIAGNOSIS — Z510 Encounter for antineoplastic radiation therapy: Secondary | ICD-10-CM | POA: Diagnosis not present

## 2023-12-09 LAB — RAD ONC ARIA SESSION SUMMARY
Course Elapsed Days: 6
Plan Fractions Treated to Date: 5
Plan Prescribed Dose Per Fraction: 6 Gy
Plan Total Fractions Prescribed: 10
Plan Total Prescribed Dose: 60 Gy
Reference Point Dosage Given to Date: 30 Gy
Reference Point Session Dosage Given: 6 Gy
Session Number: 5

## 2023-12-10 ENCOUNTER — Other Ambulatory Visit: Payer: Self-pay

## 2023-12-10 ENCOUNTER — Ambulatory Visit
Admission: RE | Admit: 2023-12-10 | Discharge: 2023-12-10 | Disposition: A | Discharge status: Home / Self Care | Source: Ambulatory Visit | Attending: Radiation Oncology | Admitting: Radiation Oncology

## 2023-12-10 DIAGNOSIS — Z510 Encounter for antineoplastic radiation therapy: Secondary | ICD-10-CM | POA: Diagnosis not present

## 2023-12-10 LAB — RAD ONC ARIA SESSION SUMMARY
Course Elapsed Days: 7
Plan Fractions Treated to Date: 6
Plan Prescribed Dose Per Fraction: 6 Gy
Plan Total Fractions Prescribed: 10
Plan Total Prescribed Dose: 60 Gy
Reference Point Dosage Given to Date: 36 Gy
Reference Point Session Dosage Given: 6 Gy
Session Number: 6

## 2023-12-11 ENCOUNTER — Other Ambulatory Visit: Payer: Self-pay

## 2023-12-11 ENCOUNTER — Ambulatory Visit
Admission: RE | Admit: 2023-12-11 | Discharge: 2023-12-11 | Disposition: A | Discharge status: Home / Self Care | Source: Ambulatory Visit | Attending: Radiation Oncology | Admitting: Radiation Oncology

## 2023-12-11 DIAGNOSIS — Z510 Encounter for antineoplastic radiation therapy: Secondary | ICD-10-CM | POA: Diagnosis not present

## 2023-12-11 LAB — RAD ONC ARIA SESSION SUMMARY
Course Elapsed Days: 8
Plan Fractions Treated to Date: 7
Plan Prescribed Dose Per Fraction: 6 Gy
Plan Total Fractions Prescribed: 10
Plan Total Prescribed Dose: 60 Gy
Reference Point Dosage Given to Date: 42 Gy
Reference Point Session Dosage Given: 6 Gy
Session Number: 7

## 2023-12-12 ENCOUNTER — Ambulatory Visit
Admission: RE | Admit: 2023-12-12 | Discharge: 2023-12-12 | Disposition: A | Discharge status: Home / Self Care | Source: Ambulatory Visit | Attending: Radiation Oncology | Admitting: Radiation Oncology

## 2023-12-12 ENCOUNTER — Other Ambulatory Visit: Payer: Self-pay

## 2023-12-12 DIAGNOSIS — Z510 Encounter for antineoplastic radiation therapy: Secondary | ICD-10-CM | POA: Diagnosis not present

## 2023-12-12 LAB — RAD ONC ARIA SESSION SUMMARY
Course Elapsed Days: 9
Plan Fractions Treated to Date: 8
Plan Prescribed Dose Per Fraction: 6 Gy
Plan Total Fractions Prescribed: 10
Plan Total Prescribed Dose: 60 Gy
Reference Point Dosage Given to Date: 48 Gy
Reference Point Session Dosage Given: 6 Gy
Session Number: 8

## 2023-12-13 ENCOUNTER — Other Ambulatory Visit: Payer: Self-pay

## 2023-12-13 ENCOUNTER — Ambulatory Visit
Admission: RE | Admit: 2023-12-13 | Discharge: 2023-12-13 | Disposition: A | Discharge status: Home / Self Care | Source: Ambulatory Visit | Attending: Radiation Oncology | Admitting: Radiation Oncology

## 2023-12-13 ENCOUNTER — Inpatient Hospital Stay: Attending: Oncology

## 2023-12-13 DIAGNOSIS — Z510 Encounter for antineoplastic radiation therapy: Secondary | ICD-10-CM | POA: Diagnosis not present

## 2023-12-13 LAB — RAD ONC ARIA SESSION SUMMARY
Course Elapsed Days: 10
Plan Fractions Treated to Date: 9
Plan Prescribed Dose Per Fraction: 6 Gy
Plan Total Fractions Prescribed: 10
Plan Total Prescribed Dose: 60 Gy
Reference Point Dosage Given to Date: 54 Gy
Reference Point Session Dosage Given: 6 Gy
Session Number: 9

## 2023-12-13 NOTE — Progress Notes (Signed)
 Nutrition Assessment   Reason for Assessment:   MOC referral   ASSESSMENT:  73 year old female with stage II lung cancer.  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  Patient receiving radiation only, not a candidate for chemotherapy.   Met with patient following radiation. Reports that since July 2024 after starting antibiotics for MAC lung infection food has not tasted right and decreased intake.  Reports that she eats cereal for breakfast.  Lunch is sandwich (tuna fish, bologna, beef, peanut butter and jelly).  Dinner dtr prepares and she ate mashed potatoes and gravy last night and biscuit because could not chew country style steak.  Dentures do not fit well unless puts a lot of poly grip on them.  Snacks on pretzels, cheese puffs, candy bar.  Drinks sweet tea, coffee and sometimes Dr Kathlene Paradise.  Does not like milk unless it is chocolate milk. Daughter does meal preparation and patient goes with her to grocery store.      Nutrition Focused Physical Exam:   Orbital Region: severe Buccal Region: severe Upper Arm Region: severe Thoracic and Lumbar Region: severe Temple Region: severe Clavicle Bone Region: severe Shoulder and Acromion Bone Region: severe Scapular Bone Region: severe Dorsal Hand: severe Patellar Region: severe Anterior Thigh Region: severe Posterior Calf Region: moderate Edema (RD assessment): none Hair: observed Eyes: observed Mouth: did not observe Skin: observe Nails: observe   Medications: reviewed   Labs: reviewed   Anthropometrics:   Height: 62 inches Weight: 82 lb on 4/7  93 lb 01/31/23 113 lb per patient UBW per chart 112 lb 12.8 oz on 11/21/2021 BMI: 15   12% weight loss in the last 10 months, concerning  Estimated Energy Needs  Kcals: 1100-1300 Protein: 55-65 g Fluid: 1100-1300 ml   NUTRITION DIAGNOSIS: Inadequate oral intake related to chronic illness, antibiotics as evidenced by severe muscle  mass and fat mass loss and eating less than 75% of estimate energy needs for > 1 month   MALNUTRITION DIAGNOSIS: Patient meets criteria for severe malnutrition in context of chronic illness as evidenced by severe muscle mass and fat mass loss   INTERVENTION:  Discussed ways to increase calories and protein. Handout provided Recommend 350 calorie shakes (chocolate) for added nutrition 1-2 times day Encouraged drinking chocolate milk with meals for added nutrition Consider appetite stimulant Contact information provided   MONITORING, EVALUATION, GOAL: weight trends, intake   Next Visit: phone call f/u Friday, May 9   Teoman Giraud B. Zollie Hipp, CSO, LDN Registered Dietitian [phone]

## 2023-12-16 ENCOUNTER — Other Ambulatory Visit: Payer: Self-pay

## 2023-12-16 ENCOUNTER — Encounter: Payer: Self-pay | Admitting: Oncology

## 2023-12-16 ENCOUNTER — Ambulatory Visit
Admission: RE | Admit: 2023-12-16 | Discharge: 2023-12-16 | Disposition: A | Discharge status: Home / Self Care | Source: Ambulatory Visit | Attending: Radiation Oncology | Admitting: Radiation Oncology

## 2023-12-16 ENCOUNTER — Inpatient Hospital Stay (HOSPITAL_BASED_OUTPATIENT_CLINIC_OR_DEPARTMENT_OTHER): Admitting: Oncology

## 2023-12-16 VITALS — BP 117/56 | HR 81 | Temp 97.4°F | Resp 16 | Ht 62.0 in | Wt 80.4 lb

## 2023-12-16 DIAGNOSIS — C3432 Malignant neoplasm of lower lobe, left bronchus or lung: Secondary | ICD-10-CM

## 2023-12-16 DIAGNOSIS — F17210 Nicotine dependence, cigarettes, uncomplicated: Secondary | ICD-10-CM | POA: Insufficient documentation

## 2023-12-16 DIAGNOSIS — K639 Disease of intestine, unspecified: Secondary | ICD-10-CM | POA: Insufficient documentation

## 2023-12-16 DIAGNOSIS — Z510 Encounter for antineoplastic radiation therapy: Secondary | ICD-10-CM | POA: Diagnosis present

## 2023-12-16 LAB — RAD ONC ARIA SESSION SUMMARY
Course Elapsed Days: 13
Plan Fractions Treated to Date: 10
Plan Prescribed Dose Per Fraction: 6 Gy
Plan Total Fractions Prescribed: 10
Plan Total Prescribed Dose: 60 Gy
Reference Point Dosage Given to Date: 60 Gy
Reference Point Session Dosage Given: 6 Gy
Session Number: 10

## 2023-12-16 NOTE — Progress Notes (Unsigned)
 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  INTERVAL HISTORY: Patient returns to clinic today for further evaluation, discussion of her PET scan results, and treatment planning.  She continues to feel well and remains asymptomatic.  She has no neurologic complaints.  She denies any recent fevers or illnesses.  She has a good appetite and denies weight loss.  She has no chest pain, shortness of breath, cough, or hemoptysis.  She denies any nausea, vomiting, constipation, or diarrhea.  She has no urinary complaints.  Patient offers no specific complaints today.  REVIEW OF SYSTEMS:   Review of Systems  Constitutional: Negative.  Negative for fever, malaise/fatigue and weight loss.  Respiratory: Negative.  Negative for cough, hemoptysis and shortness of breath.   Cardiovascular: Negative.  Negative for chest pain and leg swelling.  Gastrointestinal: Negative.  Negative for abdominal pain, blood in stool and melena.  Genitourinary: Negative.  Negative for dysuria.  Musculoskeletal: Negative.  Negative for back pain.  Skin: Negative.  Negative for rash.  Neurological: Negative.  Negative for dizziness, focal weakness, weakness and headaches.  Psychiatric/Behavioral: Negative.  The patient is not nervous/anxious.     As per HPI. Otherwise, a complete review of systems is negative.  PAST MEDICAL HISTORY: Past Medical History:   Diagnosis Date  . Chicken pox   . COPD (chronic obstructive pulmonary disease) (HCC)   . Hyperlipidemia   . Measles     PAST SURGICAL HISTORY: Past Surgical History:  Procedure Laterality Date  . TUBAL LIGATION  1978    FAMILY HISTORY: Family History  Family history unknown: Yes    ADVANCED DIRECTIVES (Y/N):  N  HEALTH MAINTENANCE: Social History   Tobacco Use  . Smoking status: Every Day    Current packs/day: 1.00    Average packs/day: 1 pack/day for 48.0 years (48.0 ttl pk-yrs)    Types: Cigarettes  . Smokeless tobacco: Never  . Tobacco comments:    1 PPD - khj 09/20/2023  Vaping Use  . Vaping status: Never Used  Substance Use Topics  . Alcohol use: No    Alcohol/week: 0.0 standard drinks of alcohol  . Drug use: No     Colonoscopy:  PAP:  Bone density:  Lipid panel:  No Known Allergies  Current Outpatient Medications  Medication Sig Dispense Refill  . albuterol  (PROVENTIL ) (2.5 MG/3ML) 0.083% nebulizer solution Take 3 mLs (2.5 mg total) by nebulization every 6 (six) hours as needed for wheezing or shortness of breath. 75 mL 5  . albuterol  (VENTOLIN  HFA) 108 (90 Base) MCG/ACT inhaler Inhale 2 puffs into the lungs every 6 (six) hours as needed for wheezing or shortness of breath. 54 g 0  . aspirin  EC 81 MG tablet Take 1 tablet (81 mg total) by mouth daily. Swallow whole. 30 tablet 12  . azithromycin  (ZITHROMAX ) 250 MG tablet Take 1 tablet (250 mg total) by mouth daily. For mycobacterium avium treatmentFor mycobacterium avium treatment 30 each 2  . ethambutol  (MYAMBUTOL ) 400 MG tablet Take 1.5 tablets (600 mg  total) by mouth daily. 45 tablet 2  . Fluticasone -Umeclidin-Vilant (TRELEGY ELLIPTA ) 200-62.5-25 MCG/ACT AEPB Inhale 1 puff into the lungs daily. 180 each 3  . rifampin  (RIFADIN ) 150 MG capsule Take 3 capsules (450 mg total) by mouth daily. 90 capsule 2   No current facility-administered medications for this visit.    OBJECTIVE: Vitals:   12/16/23  1420  BP: (!) 117/56  Pulse: 81  Resp: 16  Temp: (!) 97.4 F (36.3 C)  SpO2: 99%     Body mass index is 14.71 kg/m.    ECOG FS:0 - Asymptomatic  General: Thin, no acute distress. Eyes: Pink conjunctiva, anicteric sclera. HEENT: Normocephalic, moist mucous membranes. Lungs: No audible wheezing or coughing. Heart: Regular rate and rhythm. Abdomen: Soft, nontender, no obvious distention. Musculoskeletal: No edema, cyanosis, or clubbing. Neuro: Alert, answering all questions appropriately. Cranial nerves grossly intact. Skin: No rashes or petechiae noted. Psych: Normal affect.  LAB RESULTS:  Lab Results  Component Value Date   NA 137 12/02/2023   K 3.7 12/02/2023   CL 104 12/02/2023   CO2 26 12/02/2023   GLUCOSE 105 12/02/2023   BUN 9 12/02/2023   CREATININE 0.65 12/02/2023   CALCIUM  8.7 12/02/2023   PROT 6.2 12/02/2023   ALBUMIN 3.0 (L) 11/07/2023   AST 7 (L) 12/02/2023   ALT 5 (L) 12/02/2023   ALKPHOS 65 11/07/2023   BILITOT 0.2 12/02/2023   GFRNONAA >60 11/07/2023   GFRAA >60 09/25/2013    Lab Results  Component Value Date   WBC 9.6 12/02/2023   NEUTROABS 7.3 11/07/2023   HGB 10.0 (L) 12/02/2023   HCT 29.8 (L) 12/02/2023   MCV 92.5 12/02/2023   PLT 376 12/02/2023     STUDIES: No results found.   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  Repeat PET scan from November 12, 2023 reviewed independently and report as above essentially unchanged from previous confirming patient's stage.  MRI of the brain on November 12, 2023 did not reveal any evidence of metastatic disease.  Case discussed with pulmonary and it was agreed upon that although she has high risk, giving concurrent chemotherapy may be more detrimental and plan to proceed with XRT treatments only.  Will  send for PD-L1 to see if immunotherapy may an option in the future if necessary.  No further interventions are needed.  Return to clinic at the end of XRT for further evaluation.   Hypermetabolic sigmoid lesion: Improved on most recent PET scan.  Previously, patient was given referral to GI for further evaluation and consideration of colonoscopy. CEA is within normal limits at 3.8.  Patient cannot undergo general anesthesia. MAC infection: Continue current antibiotics.  Follow-up and treatment per pulmonary. Parotid lesion: Patient incidentally noted to have a 1.5 cm mass in the left parotid on MRI of the brain.  Will continue active surveillance and repeat imaging in approximately 3 months.  Patient expressed understanding and was in agreement with this plan. She also understands that She can call clinic at any time with any questions, concerns, or complaints.    Cancer Staging  Squamous cell carcinoma of lower lobe of left lung (HCC) Staging form: Lung, AJCC 8th Edition - Clinical stage from 11/08/2023: Stage IIA (cT2b, cN0, cM0) - Signed by Shellie Dials, MD on 11/08/2023 Stage prefix: Initial diagnosis  Shellie Dials, MD   12/16/2023 3:06 PM

## 2023-12-17 NOTE — Radiation Completion Notes (Signed)
 Patient Name: Alejandra Ortiz, Alejandra Ortiz MRN: [id number] Date of Birth: Dec 17, 1950 Referring Physician: Helayne Lo, M.D. Date of Service: 2023-12-17 Radiation Oncologist: Glenis Langdon, M.D. [facility] [place]                             RADIATION ONCOLOGY END OF TREATMENT NOTE     Diagnosis: C34.32 Malignant neoplasm of lower lobe, left bronchus or lung Staging on 2023-11-08: Squamous cell carcinoma of lower lobe of left lung (HCC) T=cT2b, N=cN0, M=cM0 Intent: Curative     HPI: Patient is a 73 year old female who is followed by interval enlargement of the lingular mass worry some for pulmonary neoplasm.  PET scan back in December showed a 4.1 cm cavitary mass in the anterior left lower lobe concerning for primary bronchogenic carcinoma.  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  She underwent a CT-guided biopsy of the lung mass which was positive for non-small cell lung cancer favoring squamous cell carcinoma.  MRI brain shows no evidence of intracranial metastasis.  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  MRI PET scan yesterday wasPerformed showing no significant change in the size of metabolic activity within the dominant cavity and left lower lobe the less well-defined chronic cavity in the right apical lesion demonstrates low-level metabolic activity and is stable in size favoring to reflect chronic MAC infection.  She does have a slight cough no hemoptysis.  No chest tightness or change in her pulmonary status.  She has been declined for systemic chemotherapy based on her medical comorbidities and the MAC.  She is now referred to ration collagen for consideration of treatment.      ==========DELIVERED PLANS==========  First Treatment Date: 2023-12-03 Last Treatment Date: 2023-12-16   Plan Name: Lung_L_UHRT Site: Lung, Left Technique: IMRT Mode: Photon Dose Per Fraction: 6  Gy Prescribed Dose (Delivered / Prescribed): 60 Gy / 60 Gy Prescribed Fxs (Delivered / Prescribed): 10 / 10     ==========ON TREATMENT VISIT DATES========== 2023-12-03, 2023-12-10     ==========UPCOMING VISITS========== 03/16/2024 [facility]-PET CT NM PET WB & SB TO MID THIGH [facility] INFUSION  01/13/2024 [facility] RAD ONCOLOGY FOLLOW UP 30 Glenis Langdon, MD  01/03/2024 [facility] MED ONC NUT [address]  12/19/2023 [facility] INF DIS CTR OFFICE VISIT Alica Inks, MD        ==========APPENDIX - ON TREATMENT VISIT NOTES==========   See weekly On Treatment Notes in Epic for details in the Media tab (listed as Progress notes on the On Treatment Visit Dates listed above).

## 2023-12-19 ENCOUNTER — Ambulatory Visit: Payer: Medicare Other | Attending: Infectious Diseases | Admitting: Infectious Diseases

## 2023-12-19 ENCOUNTER — Encounter: Payer: Self-pay | Admitting: Infectious Diseases

## 2023-12-19 VITALS — BP 100/49 | HR 88 | Temp 98.2°F | Ht 62.0 in | Wt 81.0 lb

## 2023-12-19 DIAGNOSIS — Z681 Body mass index (BMI) 19 or less, adult: Secondary | ICD-10-CM | POA: Insufficient documentation

## 2023-12-19 DIAGNOSIS — Z792 Long term (current) use of antibiotics: Secondary | ICD-10-CM | POA: Insufficient documentation

## 2023-12-19 DIAGNOSIS — F17210 Nicotine dependence, cigarettes, uncomplicated: Secondary | ICD-10-CM | POA: Diagnosis not present

## 2023-12-19 DIAGNOSIS — R634 Abnormal weight loss: Secondary | ICD-10-CM | POA: Diagnosis not present

## 2023-12-19 DIAGNOSIS — J449 Chronic obstructive pulmonary disease, unspecified: Secondary | ICD-10-CM | POA: Diagnosis not present

## 2023-12-19 DIAGNOSIS — A310 Pulmonary mycobacterial infection: Secondary | ICD-10-CM

## 2023-12-19 DIAGNOSIS — H9190 Unspecified hearing loss, unspecified ear: Secondary | ICD-10-CM | POA: Diagnosis not present

## 2023-12-19 DIAGNOSIS — J439 Emphysema, unspecified: Secondary | ICD-10-CM | POA: Insufficient documentation

## 2023-12-19 DIAGNOSIS — C3492 Malignant neoplasm of unspecified part of left bronchus or lung: Secondary | ICD-10-CM | POA: Insufficient documentation

## 2023-12-19 DIAGNOSIS — C3490 Malignant neoplasm of unspecified part of unspecified bronchus or lung: Secondary | ICD-10-CM

## 2023-12-19 NOTE — Progress Notes (Signed)
 NAME: Alejandra Ortiz  DOB: June 14, 1951  MRN: [id number]  Date/Time: 12/19/2023 12:12 PM  Subjective:   ?follow up For pulmonary  MAC Alejandra Ortiz is a 73 y.o. 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 She was having routine cancer screeening CT and it was showing progression of nodules and then a cavitary lesion. 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 She takes these medications in the morning but is unsure of the exact dosages. Despite treatment, she continues to experience symptoms including a dry cough and occasional phlegm production in the mornings. A biopsy performed on the left lung nodule on October 31, 2023, confirmed the presence of MAC bacteria in her lung tissue.  In addition to MAC, she was diagnosed with non-small cell lung cancer, favoring squamous cell carcinoma, in her left lung following a biopsy on October 31, 2023. She completed a course of ten radiation treatments, finishing on December 16, 2023. Chemotherapy was not pursued due to concerns about exacerbating her MAC infection. No significant side effects from the radiation treatment.  She has a history of COPD and emphysema,  She has experienced a weight loss of 12 pounds over the past year, with her current weight at 81 pounds. Her appetite is variable; she reports eating a bowl of cereal in the morning and a steak with a baked potato for dinner the previous night.  Socially, she lives with her husband and two daughters. She is a smoker and is no longer working. She has been managing her daily routine around her medication schedule and describes it as 'just been a daily  routine.'      Past Medical History:  Diagnosis Date   Chicken pox    COPD (chronic obstructive pulmonary disease) (HCC)    Hyperlipidemia    Measles     Past Surgical History:  Procedure Laterality Date   TUBAL LIGATION  1978    Social History   Socioeconomic History   Marital status: Married    Spouse name: Roxane Puerto   Number of children: 5   Years of education: Not on file   Highest education level: Not on file  Occupational History   Occupation: Knit    Comment: [employer]  Tobacco Use   Smoking status: Every Day    Current packs/day: 1.00    Average packs/day: 1 pack/day for 48.0 years (48.0 ttl pk-yrs)    Types: Cigarettes   Smokeless tobacco: Never   Tobacco comments:    1 PPD - khj 09/20/2023  Vaping Use   Vaping status: Never Used  Substance and Sexual Activity   Alcohol use: No    Alcohol/week: 0.0 standard drinks of alcohol   Drug use: No   Sexual activity: Never    Birth control/protection: Surgical  Other Topics Concern   Not on file  Social History Narrative   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   Lives with husband and 5 children with 12 grandchildren, 4 great-grandchildren   1 dog lives inside    Associates Degree   Right hand dominant    Enjoys reading  Social Drivers of Corporate investment banker Strain: Low Risk  (08/30/2023)   Overall Financial Resource Strain (CARDIA)    Difficulty of Paying Living Expenses: Not hard at all  Food Insecurity: No Food Insecurity (08/30/2023)   Hunger Vital Sign    Worried About Running Out of Food in the Last Year: Never true    Ran Out of Food in the Last Year: Never true  Transportation Needs: No Transportation Needs (08/30/2023)   PRAPARE - Administrator, Civil Service (Medical): No    Lack of Transportation (Non-Medical): No  Physical Activity: Insufficiently Active (08/30/2023)   Exercise Vital Sign    Days of Exercise per Week: 3 days    Minutes of Exercise per Session:  30 min  Stress: No Stress Concern Present (08/30/2023)   [employer] of Occupational Health - Occupational Stress Questionnaire    Feeling of Stress : Not at all  Social Connections: Moderately Isolated (08/30/2023)   Social Connection and Isolation Panel [NHANES]    Frequency of Communication with Friends and Family: Once a week    Frequency of Social Gatherings with Friends and Family: More than three times a week    Attends Religious Services: Never    Database administrator or Organizations: No    Attends Banker Meetings: Never    Marital Status: Married  Catering manager Violence: Not At Risk (08/30/2023)   Humiliation, Afraid, Rape, and Kick questionnaire    Fear of Current or Ex-Partner: No    Emotionally Abused: No    Physically Abused: No    Sexually Abused: No    Family History  Family history unknown: Yes  adopted No Known Allergies I? Current Outpatient Medications  Medication Sig Dispense Refill   albuterol  (PROVENTIL ) (2.5 MG/3ML) 0.083% nebulizer solution Take 3 mLs (2.5 mg total) by nebulization every 6 (six) hours as needed for wheezing or shortness of breath. 75 mL 5   albuterol  (VENTOLIN  HFA) 108 (90 Base) MCG/ACT inhaler Inhale 2 puffs into the lungs every 6 (six) hours as needed for wheezing or shortness of breath. 54 g 0   aspirin  EC 81 MG tablet Take 1 tablet (81 mg total) by mouth daily. Swallow whole. 30 tablet 12   azithromycin  (ZITHROMAX ) 250 MG tablet Take 1 tablet (250 mg total) by mouth daily. For mycobacterium avium treatmentFor mycobacterium avium treatment 30 each 2   ethambutol  (MYAMBUTOL ) 400 MG tablet Take 1.5 tablets (600 mg total) by mouth daily. 45 tablet 2   Fluticasone -Umeclidin-Vilant (TRELEGY ELLIPTA ) 200-62.5-25 MCG/ACT AEPB Inhale 1 puff into the lungs daily. 180 each 3   rifampin  (RIFADIN ) 150 MG capsule Take 3 capsules (450 mg total) by mouth daily. 90 capsule 2   No current facility-administered medications for this  visit.     Abtx:  Anti-infectives (From admission, onward)    None       REVIEW OF SYSTEMS:  Const: negative fever, negative chills, weight loss of 10 pounds since starting MAC in July 2024 Eyes: negative diplopia or visual changes, negative eye pain ENT: negative coryza, negative sore throat Resp:  cough,  white sputum dyspnea Cards: negative for chest pain, palpitations, lower extremity edema GU: negative for frequency, dysuria and hematuria GI: poor appetite Negative for abdominal pain, diarrhea, bleeding, constipation Skin: negative for rash and pruritus Heme: negative for easy bruising and gum/nose bleeding MS: negative for myalgias, arthralgias, back pain and muscle weakness Neurolo:negative for headaches, dizziness, vertigo, memory problems  Psych:  anxiety, depression  Endocrine: negative for thyroid , diabetes Allergy/Immunology- negative for any medication or food allergies ? Objective:  VITALS:  BP (!) 100/49   Pulse 88   Temp 98.2 F (36.8 C) (Temporal)   Ht 5\' 2"  (1.575 m)   Wt 81 lb (36.7 kg)   SpO2 95%   BMI 14.82 kg/m   PHYSICAL EXAM:  General: Alert, cooperative, no distress, frail and thin built -BMI low Head: Normocephalic, without obvious abnormality, atraumatic. Eyes: Conjunctivae clear, anicteric sclerae. Pupils are equal ENT Nares normal. No drainage or sinus tenderness. Lips, mucosa, and tongue normal. No Thrush dentures Neck: Supple, symmetrical, no adenopathy, thyroid : non tender no carotid bruit and no JVD. Back: No CVA tenderness. Lungs: b/l air entry crepts present Heart: s1s2 Abdomen: Soft, non-tender,not distended. Bowel sounds normal. No masses Extremities: atraumatic, no cyanosis. No edema. No clubbing Skin: No rashes or lesions. Or bruising Lymph: Cervical, supraclavicular normal. Neurologic: Grossly non-focal Pertinent Labs    Latest Ref Rng & Units 12/02/2023    1:41 PM 11/07/2023    3:56 PM 06/18/2023   12:40 PM  CMP   Glucose 65 - 139 mg/dL 956  94  213   BUN 7 - 25 mg/dL 9  8  12    Creatinine 0.60 - 1.00 mg/dL 0.86  5.78  4.69   Sodium 135 - 146 mmol/L 137  136  135   Potassium 3.5 - 5.3 mmol/L 3.7  3.9  4.3   Chloride 98 - 110 mmol/L 104  102  102   CO2 20 - 32 mmol/L 26  23  25    Calcium  8.6 - 10.4 mg/dL 8.7  9.0  9.0   Total Protein 6.1 - 8.1 g/dL 6.2  7.4  7.6   Total Bilirubin 0.2 - 1.2 mg/dL 0.2  0.4  0.7   Alkaline Phos 38 - 126 U/L  65  66   AST 10 - 35 U/L 7  11  12    ALT 6 - 29 U/L 5  10  10          Latest Ref Rng & Units 12/02/2023    1:41 PM 11/07/2023    3:56 PM 10/31/2023    9:22 AM  CBC  WBC 3.8 - 10.8 Thousand/uL 9.6  9.1  9.1   Hemoglobin 11.7 - 15.5 g/dL 62.9  52.8  41.3   Hematocrit 35.0 - 45.0 % 29.8  34.8  34.8   Platelets 140 - 400 Thousand/uL 376  371  334        Microbiology: 5/24 Sputum [employer] positive for MAI 02/04/23 -MAI  IMAGING RESULTS:  I have personally reviewed the films ?Extensive pleuropulmonary apical scarring   Left lingular cavitating mass    Impression/Recommendation ? ? 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 Because of the cavitary lesions will need amikacin last  Sputum from oct 2024 still positive for MAC,  Discussed with Dr.Gonzalez pulmonologist - she is too frail for IV amikacin- so she will be giving her nebulized amikacin first and she how she does  She had eye examination before starting Rx baseline acuity and color vision Had auditory test and getting hearing aids Will refer to ENT for evaluation of vestibulocochlear function assessment  Left lung mass- now diagnosed as non small cell cancer- she got 10 radiation   weight loss Patient has lost nearly 12 pounds since the start of treatment.  Advised high protein and high calorie diet    COPD  Tobacco use Patient continues to smoke about a  pack a day.  -Encourage smoking cessation.  Discussed the management with patient and Dr.Gonzalez

## 2023-12-19 NOTE — Patient Instructions (Signed)
 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 We reviewed your current symptoms, treatment regimen, and recent biopsy results. We also addressed your history of emphysema, smoking, and recent weight loss.  YOUR PLAN:  -MYCOBACTERIUM AVIUM COMPLEX (MAC) LUNG INFECTION: This is a chronic lung infection that is difficult to treat, especially with your existing emphysema and lung cancer. You are currently on three antibiotics: rifampin , azithromycin , and ethambutol . We are considering adding a fourth antibiotic, amikacin, which may have side effects on your hearing and kidneys. We will consult with Dr. Viva Grise about starting this medication and will perform a hearing test and monitor your kidney function.   -NON-SMALL CELL LUNG CANCER, LEFT LUNG: This type of lung cancer was recently diagnosed in your left lung. You have completed radiation therapy, and chemotherapy is not recommended due to the risk of worsening your MAC infection. It is important to follow up with your oncology team for ongoing management of your lung cancer, and we will coordinate with Dr. Viva Grise to ensure integrated care.  -EMPHYSEMA: Emphysema is a chronic lung condition that makes it hard to breathe and complicates the treatment of your MAC infection. Quitting smoking is crucial to improve your respiratory health and the effectiveness of your treatments.  -SMOKING: Continuing to smoke despite having emphysema and lung cancer significantly impacts your health. It is strongly advised that you quit smoking, and we can provide resources and support to help you stop.  -WEIGHT LOSS: You have lost 12 pounds over the past year and currently weigh 81 pounds. To help you gain weight, we recommend a high-protein, high-calorie diet that includes foods like eggs, bacon, chicken, and nuts. We will monitor your weight regularly to  track your progress.  INSTRUCTIONS:   Ensure you have a baseline hearing test before starting amikacin and monitor your kidney function during therapy. Continue to follow up with your oncology team for lung cancer management. Maintain a high-protein, high-calorie diet to address your weight loss, and monitor your weight regularly.

## 2024-01-03 ENCOUNTER — Inpatient Hospital Stay: Attending: Oncology

## 2024-01-03 ENCOUNTER — Encounter: Payer: Self-pay | Admitting: Pulmonary Disease

## 2024-01-03 ENCOUNTER — Telehealth: Payer: Self-pay

## 2024-01-03 ENCOUNTER — Ambulatory Visit: Admitting: Pulmonary Disease

## 2024-01-03 VITALS — BP 90/56 | HR 73 | Temp 97.7°F | Ht 62.0 in | Wt 79.8 lb

## 2024-01-03 DIAGNOSIS — A310 Pulmonary mycobacterial infection: Secondary | ICD-10-CM

## 2024-01-03 DIAGNOSIS — F17210 Nicotine dependence, cigarettes, uncomplicated: Secondary | ICD-10-CM

## 2024-01-03 DIAGNOSIS — R64 Cachexia: Secondary | ICD-10-CM

## 2024-01-03 DIAGNOSIS — C3492 Malignant neoplasm of unspecified part of left bronchus or lung: Secondary | ICD-10-CM | POA: Diagnosis not present

## 2024-01-03 DIAGNOSIS — J449 Chronic obstructive pulmonary disease, unspecified: Secondary | ICD-10-CM | POA: Diagnosis not present

## 2024-01-03 MED ORDER — ARIKAYCE 590 MG/8.4ML IN SUSP: 590.0000 mg | Freq: Every day | RESPIRATORY_TRACT | Status: DC

## 2024-01-03 NOTE — Progress Notes (Signed)
 Nutrition Follow-up:  Patient with stage II lung cancer.  Patient completed radiation on 12/16/23.    Spoke with patient via phone for follow-up.  Reports that her appetite is doing ok.  Denies trouble swallowing or pain with swallowing foods.  Has been able to eat whatever foods that she wants (meats, vegetables).  Drinking premier protein shakes at times along with water, tea, soft drinks.     Medications: reviewed  Labs: reviewed  Anthropometrics:   Weight 79 lb 12.8 oz on 5/9  82 lb on 4/7 93 lb on 01/31/23   NUTRITION DIAGNOSIS: Inadequate oral intake stable   MALNUTRITION DIAGNOSIS: Severe malnutrition continues   INTERVENTION:  Reviewed ways to increase calories and protein in diet Recommend 350 calorie shake and provided examples of those.  Has not been drinking chocolate milk.  Encouraged her to add with meals Consider adding appetite stimulant if weight continues to decline Declines further follow up with RD at this time.Patient has contact information     NEXT VISIT: no follow-up  RD available if needed  Kameah Rawl B. Zollie Hipp, CSO, LDN Registered Dietitian [phone]

## 2024-01-03 NOTE — Patient Instructions (Signed)
 VISIT SUMMARY:  Today, you came in for a follow-up visit to discuss your severe COPD, non-small cell lung cancer, and Mycobacterium avium complex infection. We reviewed your current treatments and made some adjustments to better manage your conditions.  YOUR PLAN:  -SEVERE COPD: Chronic obstructive pulmonary disease (COPD) is a long-term lung condition that makes it hard to breathe. Despite the known risks, you continue to smoke.  We continue to recommend that you discontinue smoking as this will help your many issues.  -NON-SMALL CELL LUNG CANCER: Non-small cell lung cancer is a type of lung cancer. You have completed your radiation therapy, and a PET CT scan is scheduled for July 21st to assess how well the treatment worked. It's important to keep this appointment as planned.  -MYCOBACTERIUM AVIUM COMPLEX INFECTION: This is a type of bacterial infection that affects the lungs. You are currently on medication for this infection, and we plan to add nebulized Arikayce to your treatment. This form of the medication is less likely to cause kidney problems compared to the IV form. We will help you access programs to cover the high cost of this medication.To help prevent lung irritation, I recommend using albuterol  before your nebulized medication.  The medication request will be sent through specialty pharmacy.  INSTRUCTIONS:  Please make sure to attend your PET CT scan on July 21st. We will also send a prescription for Arikayce 590 mg nebulized daily to a specialty pharmacy for preauthorization and assist you with accessing programs to help cover the medication costs.  We will see you in follow-up in 3 months time call sooner should any problems arise.

## 2024-01-03 NOTE — Progress Notes (Unsigned)
 Subjective:    Patient ID: Alejandra Ortiz, female    DOB: 03-23-1951, 73 y.o.   MRN: [id number]  Patient Care Team: Carollynn Cirri, NP as PCP - General (Internal Medicine) Myrna Ast, DO as Consulting Physician (Family Medicine) [facility], [facility] (Optometry) Drake Gens, RN as Oncology Nurse Navigator Glenis Langdon, MD as Consulting Physician (Radiation Oncology) Shellie Dials, MD as Consulting Physician (Oncology)  Chief Complaint  Patient presents with   Follow-up    Cough with phlegm in the morning.     BACKGROUND/INTERVAL: Patient is a 73 year old current smoker with significant pulmonary emphysema and MAC, who presents for follow-up on COPD and abnormal lung cancer screening scans.  She is currently maintained with Trelegy Ellipta  200, 1 puff daily and as needed albuterol . She was last seen here on 19 November 2023, this is a scheduled visit.  She has been following with ID as well for her MAC.  She is on MAC therapy and she has been tolerating this well.  She has been noted to have an enlarging lesion on the left lingula.  Patient is NOT a surgical candidate.  She had biopsy of the enlarging lung mass by IR on 31 October 2023.  This revealed squamous cell carcinoma.  Patient was referred to oncology.  She has received SBRT to this area.  She continues to shed MAC organisms and the consideration is for additional therapy.  HPI Discussed the use of AI scribe software for clinical note transcription with the patient, who gave verbal consent to proceed.  History of Present Illness   Alejandra Ortiz is a 73 year old female with severe COPD, non-small cell lung cancer, and Mycobacterium avium complex infection who presents for follow-up.  Patient presents with her daughter today.  I have been in communication with Dr. Francee Inch, infectious disease specialist, regarding her treatment options for the Mycobacterium infection.  Addition of amikacin  is suggested however because  of the patient's comorbidities IV amikacin  would be contraindicated.  She is currently on medication for the Mycobacterium avium complex infection.  We discussed liposomal nebulized amikacin  (Arikayce ) for management of her MAC. She is concerned about the cost of the medication, which needs to be obtained through a specialty pharmacy.  Regarding her non-small cell lung cancer, she has completed her radiation therapy. She experiences fluctuations in her weight, gaining and losing weight unexpectedly, but continues to eat regularly.  She continues to smoke despite her severe COPD and lung cancer diagnosis.  No wheezing today and denies any swelling in her legs.  Overall she feels that she is at baseline.     Review of Systems A 10 point review of systems was performed and it is as noted above otherwise negative.   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) 01/17/2023   Aortic atherosclerosis (HCC) 08/17/2021   Hyperlipidemia 02/09/2016   COPD (chronic obstructive pulmonary disease) with emphysema (HCC) 02/07/2016    Social History   Tobacco Use   Smoking status: Every Day    Current packs/day: 1.00    Average packs/day: 1 pack/day for 48.0 years (48.0 ttl pk-yrs)    Types: Cigarettes   Smokeless tobacco: Never   Tobacco comments:    1 PPD - khj 09/20/2023  Substance Use Topics   Alcohol use: No    Alcohol/week: 0.0 standard drinks of alcohol    No Known Allergies  Current Meds  Medication Sig   albuterol  (PROVENTIL ) (2.5 MG/3ML) 0.083% nebulizer solution Take 3 mLs (2.5 mg total) by nebulization every 6 (six) hours as needed for wheezing or shortness of breath.   albuterol  (VENTOLIN  HFA) 108 (90 Base) MCG/ACT inhaler Inhale 2 puffs into the  lungs every 6 (six) hours as needed for wheezing or shortness of breath.   aspirin  EC 81 MG tablet Take 1 tablet (81 mg total) by mouth daily. Swallow whole.   azithromycin  (ZITHROMAX ) 250 MG tablet Take 1 tablet (250 mg total) by mouth daily. For mycobacterium avium treatmentFor mycobacterium avium treatment   ethambutol  (MYAMBUTOL ) 400 MG tablet Take 1.5 tablets (600 mg total) by mouth daily.   Fluticasone -Umeclidin-Vilant (TRELEGY ELLIPTA ) 200-62.5-25 MCG/ACT AEPB Inhale 1 puff into the lungs daily.   rifampin  (RIFADIN ) 150 MG capsule Take 3 capsules (450 mg total) by mouth daily.    Immunization History  Administered Date(s) Administered   Fluad Quad(high Dose 65+) 07/08/2020, 07/03/2022   Fluad Trivalent(High Dose 65+) 05/07/2023   Influenza Split 05/27/2014   Influenza, High Dose Seasonal PF 06/07/2017, 06/28/2018, 06/26/2019, 08/03/2021   Influenza-Unspecified 06/25/2016   PFIZER(Purple Top)SARS-COV-2 Vaccination 10/30/2019, 11/20/2019, 05/26/2020   Pneumococcal Conjugate-13 11/08/2014   Pneumococcal Polysaccharide-23 11/19/2016   Td 11/08/2014   Tdap 05/19/2018        Objective:     BP (!) 90/56 (BP Location: Left Arm, Patient Position: Sitting, Cuff Size: Small)   Pulse 73   Temp 97.7 F (36.5 C) (Temporal)   Ht 5\' 2"  (1.575 m)   Wt 79 lb 12.8 oz (36.2 kg)   SpO2 98%   BMI 14.60 kg/m   SpO2: 98 %  GENERAL: Thin well-developed woman, no acute distress, fully ambulatory, no conversational dyspnea. HEAD: Normocephalic, atraumatic.  EYES: Pupils equal, round, reactive to light.  No scleral icterus.  MOUTH: Edentulous, oral mucosa moist.  No thrush. NECK: Supple. No thyromegaly. Trachea midline. No JVD.  No adenopathy. PULMONARY: Distant breath sounds bilaterally.  Coarse, no adventitious sounds. CARDIOVASCULAR: S1 and S2. Regular rate and rhythm.  No rubs, murmurs or gallops heard. ABDOMEN: Benign. MUSCULOSKELETAL: No joint deformity, no clubbing, no edema.   NEUROLOGIC: No overt focal deficit, no gait disturbance, speech is fluent. SKIN: Intact,warm,dry. PSYCH: Mood and behavior normal.      Assessment & Plan:     ICD-10-CM   1. Mycobacterium avium infection (HCC)  A31.0     2. Stage 3 severe COPD by GOLD classification (HCC)  J44.9     3. 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 Tobacco dependence due to cigarettes  F17.210       Meds ordered this encounter  Medications   Amikacin  Sulfate Liposome (ARIKAYCE ) 590 MG/8.4ML SUSP    Sig: Inhale 590 mg into the lungs daily.   Discussion:    Severe COPD Severe chronic obstructive pulmonary disease with continued smoking despite known risks. No wheezing on examination. - Recommend albuterol  prior to nebulized medication (Arikayce ) to prevent lung irritation.  Non-small cell lung cancer Completed radiation therapy. PET CT scheduled for July 21st to assess treatment response. Discussed potential for false readings if imaging is done too early post-radiation. - Ensure PET CT is performed on July 21st  as scheduled.  Mycobacterium avium complex infection Infection managed with current medication regimen. Plan to add nebulized Arikayce  to enhance treatment. Discussed risks of IV administration and potential nephrotoxicity, opting for nebulized form. Acknowledged high cost and need for specialty pharmacy access. - Send prescription for Arikayce  590 mg nebulized daily to specialty pharmacy for preauthorization. - Patient instructed to use albuterol  prior to Arikayce  nebulization. - Assist with accessing programs to help cover medication costs.      Advised if symptoms do not improve or worsen, to please contact office for sooner follow up or seek emergency care.    I spent 40 minutes of dedicated to the care of this patient on the date of this encounter to include pre-visit review of records, face-to-face time with  the patient discussing conditions above, post visit ordering of testing, clinical documentation with the electronic health record, making appropriate referrals as documented, and communicating necessary findings to members of the patients care team.   C. Chloe Counter, MD Advanced Bronchoscopy PCCM  [facility]    *This note was generated using voice recognition software/Dragon and/or AI transcription program.  Despite best efforts to proofread, errors can occur which can change the meaning. Any transcriptional errors that result from this process are unintentional and may not be fully corrected at the time of dictation.

## 2024-01-03 NOTE — Telephone Encounter (Signed)
 Patient was seen in the office today. Dr. Viva Grise has ordered Arikayce(Arikares) for the patient.  She has signed the form and it has been faxed to the pharmacy team for completion.

## 2024-01-06 ENCOUNTER — Telehealth: Payer: Self-pay

## 2024-01-06 NOTE — Telephone Encounter (Signed)
 Arikayce called for more information on the patient.

## 2024-01-06 NOTE — Telephone Encounter (Signed)
 Copied from CRM [id number]. Topic: General - Other >> Jan 06, 2024  2:42 PM Eveleen Hinds B wrote: Reason for CRM:  [facility] regarding a prescription. Need demographic information. (Did not have 3 identifiers) Please refax with demographics to so they can fill script  FAX: [phone]

## 2024-01-06 NOTE — Telephone Encounter (Signed)
 Source  [employer] (Other)   Subject     Topic  General - Directions    Communication  Documenting in unknown due to inability to verify patient demographics.    Gorman Laughter is calling in with [employer] to state that he requires more information from the clinic before he can reach out to the patient and handle the case associated with her. Patient's name is Alejandra Ortiz, DOB 25-Jan-2051. Caller unable to verify three identifiers because they were not included on the application submitted by the office. Please reach back out to [place] at [phone]. Gorman Laughter specifically needs to know when the PA for Arkace is approved and when the expiration date will be for that PA.

## 2024-01-07 NOTE — Telephone Encounter (Signed)
 Pharmacy team did not receive any paperwork via fax for this meication for pt

## 2024-01-07 NOTE — Telephone Encounter (Signed)
 Received Arikayce enrollment form. Faxed completed form with insurance card copy  Fax: [phone] Phone: [phone]  Geraldene Kleine, PharmD, MPH, BCPS, CPP Clinical Pharmacist (Rheumatology and Pulmonology)

## 2024-01-09 NOTE — Telephone Encounter (Signed)
 Alejandra Ortiz is calling back concerning the fax they received for the medication for patient . Alejandra Ortiz is saying information is missing on form that he needs to be filled out re faxed back .  Patient address line 1  city primary phone number  gender . The form looks like it was not completed at all . [facility] needs to refill the form with all completed information to process this on behalf of the patient . The form needs to be filled out properly and refaxed is what Alejandra Ortiz is saying please . He sounds a little irritated  On the enrollment form is the 1 800 number they can refax to

## 2024-01-09 NOTE — Telephone Encounter (Signed)
 This was completed in entirety and refaxed by me on 01/07/2024. Called company and they confirmed they have everything they needed. Rx will be triaged to [facility] who will reach out with any PA information  Geraldene Kleine, PharmD, MPH, BCPS, CPP Clinical Pharmacist (Rheumatology and Pulmonology)

## 2024-01-13 ENCOUNTER — Encounter: Payer: Self-pay | Admitting: Radiation Oncology

## 2024-01-13 ENCOUNTER — Ambulatory Visit
Admission: RE | Admit: 2024-01-13 | Discharge: 2024-01-13 | Disposition: A | Discharge status: Home / Self Care | Source: Ambulatory Visit | Attending: Radiation Oncology | Admitting: Radiation Oncology

## 2024-01-13 VITALS — BP 132/58 | HR 69 | Temp 97.9°F | Resp 14 | Ht 62.0 in | Wt 80.0 lb

## 2024-01-13 DIAGNOSIS — C3432 Malignant neoplasm of lower lobe, left bronchus or lung: Secondary | ICD-10-CM | POA: Insufficient documentation

## 2024-01-13 NOTE — Telephone Encounter (Signed)
 Returned call to [place]. He is FRM, not pharmacy and not insurance. Looks like notes taken by [facility] were completely incorrect and misleading.  SAVED a Prior Authorization request to [employer] for ARIKAYCE  via CoverMyMeds. Will update once we receive a response. Pending OV note to be signed. Routing to Dr. Viva Grise  Key: Felipe Horton

## 2024-01-13 NOTE — Progress Notes (Signed)
 Radiation Oncology Follow up Note  Name: Alejandra Ortiz   Date:   01/13/2024 MRN:  [id number] DOB: February 19, 1951    This 73 y.o. 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  She is seen today 1 month out is doing well specifically has cough hemoptysis chest tightness or any change in her pulmonary function.  She did have a lesion initially seen on PET scan in her colon although it improved on the most recent PET CT scan she cannot undergo general anesthesia and colonoscopy has been deferred..  COMPLICATIONS OF TREATMENT: none  FOLLOW UP COMPLIANCE: keeps appointments   PHYSICAL EXAM:  BP (!) 132/58   Pulse 69   Temp 97.9 F (36.6 C) (Tympanic)   Resp 14   Ht 5\' 2"  (1.575 m)   Wt 80 lb (36.3 kg)   BMI 14.63 kg/m  Thin slightly cachectic female in NAD.  Well-developed well-nourished patient in NAD. HEENT reveals PERLA, EOMI, discs not visualized.  Oral cavity is clear. No oral mucosal lesions are identified. Neck is clear without evidence of cervical or supraclavicular adenopathy. Lungs are clear to A&P. Cardiac examination is essentially unremarkable with regular rate and rhythm without murmur rub or thrill. Abdomen is benign with no organomegaly or masses noted. Motor sensory and DTR levels are equal and symmetric in the upper and lower extremities. Cranial nerves II through XII are grossly intact. Proprioception is intact. No peripheral adenopathy or edema is identified. No motor or sensory levels are noted. Crude visual fields are within normal range.  RADIOLOGY RESULTS: CT  scan ordered for July  PLAN: At the present time patient is doing well no significant side effect profile from her hypofractionated radiation therapy.  And pleased with her overall progress.  I will see her back in 3 months for follow-up.  She is already scheduled for CT scan of her chest in July.  Will review those results when available.  Patient knows to call with any concerns.  She continues follow-up care with medical oncology.  I would like to take this opportunity to thank you for allowing me to participate in the care of your patient.Glenis Langdon, MD

## 2024-01-13 NOTE — Telephone Encounter (Signed)
 Copied from CRM [id number]. Topic: Clinical - Prescription Issue >> Jan 13, 2024 11:39 AM Ambrose Junk wrote: Reason for CRM: Gorman Laughter from [facility] calling (called Friday) regarding the date and Expiration date on Patient's prescription. Please call [phone].

## 2024-01-13 NOTE — Telephone Encounter (Signed)
 See telephone encounter from 01/03/2024. Form has been faxed.   Nothing further needed.

## 2024-01-13 NOTE — Telephone Encounter (Signed)
 Copied from CRM [id number]. Topic: General - Other >> Jan 10, 2024  2:49 PM Eveleen Hinds B wrote: Reason for CRM:  Pharmacy will need approval date and expiration date for medication.  [phone].  Will check back Monday.

## 2024-01-13 NOTE — Telephone Encounter (Deleted)
 Copied from CRM [id number]. Topic: Clinical - Prescription Issue >> Jan 13, 2024 11:39 AM Ambrose Junk wrote: Reason for CRM: Alejandra Ortiz from [facility] calling (called Friday) regarding the date and Expiration date on Patient's prescription. Please call [phone].

## 2024-01-14 ENCOUNTER — Encounter: Payer: Self-pay | Admitting: Pulmonary Disease

## 2024-01-14 NOTE — Telephone Encounter (Signed)
 PA for Arikayce  submitted on CMM to [employer]  Key: [id number]   Geraldene Kleine, PharmD, MPH, BCPS, CPP Clinical Pharmacist (Rheumatology and Pulmonology)

## 2024-01-14 NOTE — Telephone Encounter (Addendum)
 Received notification from [employer] regarding a prior authorization for ARIKAYCE . Authorization has been APPROVED from 12/15/2023 to 08/26/2098.  Authorization # [id number]  The information for authorization has been provided to [place], [facility]  Geraldene Kleine, PharmD, MPH, BCPS, CPP Clinical Pharmacist (Rheumatology and Pulmonology)

## 2024-01-23 ENCOUNTER — Ambulatory Visit: Admitting: Infectious Diseases

## 2024-01-23 ENCOUNTER — Other Ambulatory Visit: Payer: Self-pay

## 2024-01-23 MED ORDER — RIFAMPIN 150 MG PO CAPS
450.0000 mg | ORAL_CAPSULE | Freq: Every day | ORAL | 2 refills | Status: DC
Start: 2024-01-23 — End: 2024-04-28

## 2024-01-23 MED ORDER — AZITHROMYCIN 250 MG PO TABS: 250.0000 mg | ORAL_TABLET | Freq: Every day | ORAL | 2 refills | Status: DC

## 2024-01-27 ENCOUNTER — Other Ambulatory Visit: Payer: Self-pay | Admitting: Pulmonary Disease

## 2024-01-27 ENCOUNTER — Other Ambulatory Visit: Payer: Self-pay | Admitting: Infectious Diseases

## 2024-01-27 DIAGNOSIS — R0602 Shortness of breath: Secondary | ICD-10-CM

## 2024-01-27 DIAGNOSIS — J449 Chronic obstructive pulmonary disease, unspecified: Secondary | ICD-10-CM

## 2024-03-03 ENCOUNTER — Ambulatory Visit: Attending: Infectious Diseases | Admitting: Infectious Diseases

## 2024-03-03 ENCOUNTER — Encounter: Payer: Self-pay | Admitting: Infectious Diseases

## 2024-03-03 VITALS — BP 135/61 | HR 77 | Temp 97.7°F

## 2024-03-03 DIAGNOSIS — C3492 Malignant neoplasm of unspecified part of left bronchus or lung: Secondary | ICD-10-CM | POA: Insufficient documentation

## 2024-03-03 DIAGNOSIS — R058 Other specified cough: Secondary | ICD-10-CM | POA: Insufficient documentation

## 2024-03-03 DIAGNOSIS — Z923 Personal history of irradiation: Secondary | ICD-10-CM | POA: Insufficient documentation

## 2024-03-03 DIAGNOSIS — J984 Other disorders of lung: Secondary | ICD-10-CM

## 2024-03-03 DIAGNOSIS — A310 Pulmonary mycobacterial infection: Secondary | ICD-10-CM | POA: Diagnosis present

## 2024-03-03 DIAGNOSIS — Z792 Long term (current) use of antibiotics: Secondary | ICD-10-CM | POA: Insufficient documentation

## 2024-03-03 DIAGNOSIS — R634 Abnormal weight loss: Secondary | ICD-10-CM | POA: Diagnosis not present

## 2024-03-03 DIAGNOSIS — F17210 Nicotine dependence, cigarettes, uncomplicated: Secondary | ICD-10-CM | POA: Diagnosis not present

## 2024-03-03 DIAGNOSIS — R636 Underweight: Secondary | ICD-10-CM | POA: Diagnosis not present

## 2024-03-03 DIAGNOSIS — J439 Emphysema, unspecified: Secondary | ICD-10-CM | POA: Insufficient documentation

## 2024-03-03 DIAGNOSIS — J449 Chronic obstructive pulmonary disease, unspecified: Secondary | ICD-10-CM

## 2024-03-03 NOTE — Progress Notes (Signed)
 NAME: Alejandra Ortiz  DOB: [birth year]  MRN: [id number]  Date/Time: 03/03/2024 10:34 AM  Subjective:   ?follow up For pulmonary  MAC Alejandra Ortiz is a 73 y.o. 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 She was having routine cancer screeening CT and it was showing progression of nodules and then a cavitary lesion. 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 She takes these medications in the morning but is unsure of the exact dosages. Despite treatment, she continues to experience symptoms including a dry cough and occasional phlegm production in the mornings. A biopsy performed on the left lung nodule on October 31, 2023, confirmed the presence of MAC bacteria in her lung tissue.  In addition to MAC, she was diagnosed with non-small cell lung cancer, favoring squamous cell carcinoma, in her left lung following a biopsy on October 31, 2023. She completed a course of ten radiation treatments, finishing on December 16, 2023. Chemotherapy was not pursued due to concerns about exacerbating her MAC infection. No significant side effects from the radiation treatment.  She has a history of COPD and emphysema,  She has experienced a weight loss of 12 pounds over the past year, with her current weight at 81 pounds. Her appetite is variable; she reports eating a bowl of cereal in the morning and a steak with a baked potato for dinner the previous night.  Socially, she lives with her husband and two daughters. She is a smoker and is no longer working. She has been managing her daily routine around her medication schedule and describes it as 'just been a daily  routine.'   She started amikacin  nebs 3 weeks ago  and has some worsening cough No fever or chills     Past Medical History:  Diagnosis Date   Chicken pox    COPD (chronic obstructive pulmonary disease) (HCC)    Hyperlipidemia    Measles     Past Surgical History:  Procedure Laterality Date   TUBAL LIGATION  1978    Social History   Socioeconomic History   Marital status: Married    Spouse name: Isyss Espinal   Number of children: 5   Years of education: Not on file   Highest education level: Not on file  Occupational History   Occupation: Knit    Comment: [employer]  Tobacco Use   Smoking status: Every Day    Current packs/day: 1.00    Average packs/day: 1 pack/day for 48.0 years (48.0 ttl pk-yrs)    Types: Cigarettes   Smokeless tobacco: Never   Tobacco comments:    1 PPD - khj 09/20/2023  Vaping Use   Vaping status: Never Used  Substance and Sexual Activity   Alcohol use: No    Alcohol/week: 0.0 standard drinks of alcohol   Drug use: No   Sexual activity: Never    Birth control/protection: Surgical  Other Topics Concern   Not on file  Social History Narrative   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   Lives with husband and 5 children with 12 grandchildren, 4 great-grandchildren   1 dog lives  inside    Associates Degree   Right hand dominant    Enjoys reading    Social Drivers of Health   Financial Resource Strain: Low Risk  (08/30/2023)   Overall Financial Resource Strain (CARDIA)    Difficulty of Paying Living Expenses: Not hard at all  Food Insecurity: No Food Insecurity (08/30/2023)   Hunger Vital Sign    Worried About Running Out of Food in the Last Year: Never true    Ran Out of Food in the Last Year: Never true  Transportation Needs: No Transportation Needs (08/30/2023)   PRAPARE - Administrator, Civil Service (Medical): No    Lack of Transportation (Non-Medical): No  Physical Activity: Insufficiently Active (08/30/2023)    Exercise Vital Sign    Days of Exercise per Week: 3 days    Minutes of Exercise per Session: 30 min  Stress: No Stress Concern Present (08/30/2023)   [employer] of Occupational Health - Occupational Stress Questionnaire    Feeling of Stress : Not at all  Social Connections: Moderately Isolated (08/30/2023)   Social Connection and Isolation Panel    Frequency of Communication with Friends and Family: Once a week    Frequency of Social Gatherings with Friends and Family: More than three times a week    Attends Religious Services: Never    Database administrator or Organizations: No    Attends Banker Meetings: Never    Marital Status: Married  Catering manager Violence: Not At Risk (08/30/2023)   Humiliation, Afraid, Rape, and Kick questionnaire    Fear of Current or Ex-Partner: No    Emotionally Abused: No    Physically Abused: No    Sexually Abused: No    Family History  Family history unknown: Yes  adopted No Known Allergies I? Current Outpatient Medications  Medication Sig Dispense Refill   albuterol  (PROVENTIL ) (2.5 MG/3ML) 0.083% nebulizer solution Take 3 mLs (2.5 mg total) by nebulization every 6 (six) hours as needed for wheezing or shortness of breath. 75 mL 5   albuterol  (VENTOLIN  HFA) 108 (90 Base) MCG/ACT inhaler Inhale 2 puffs into the lungs every 6 (six) hours as needed for wheezing or shortness of breath. 54 g 0   Amikacin  Sulfate Liposome (ARIKAYCE ) 590 MG/8.4ML SUSP Inhale 590 mg into the lungs daily.     aspirin  EC 81 MG tablet Take 1 tablet (81 mg total) by mouth daily. Swallow whole. 30 tablet 12   azithromycin  (ZITHROMAX ) 250 MG tablet Take 1 tablet (250 mg total) by mouth daily. For mycobacterium avium treatmentFor mycobacterium avium treatment 30 each 2   ethambutol  (MYAMBUTOL ) 400 MG tablet TAKE 1 AND 1/2 TABLETS(600 MG) BY MOUTH DAILY 45 tablet 2   rifampin  (RIFADIN ) 150 MG capsule Take 3 capsules (450 mg total) by mouth daily. 90 capsule 2    TRELEGY ELLIPTA  200-62.5-25 MCG/ACT AEPB USE 1 INHALATION DAILY 180 each 3   No current facility-administered medications for this visit.     Abtx:  Anti-infectives (From admission, onward)    None       REVIEW OF SYSTEMS:  Const: negative fever, negative chills, weight loss of 10 pounds since starting MAC in July 2024 Eyes: negative diplopia or visual changes, negative eye pain ENT: negative coryza, negative sore throat Resp:  cough,  white sputum dyspnea Cards: negative for chest pain, palpitations, lower extremity edema GU: negative for frequency, dysuria and hematuria GI: poor appetite Negative for abdominal pain, diarrhea, bleeding, constipation Skin:  negative for rash and pruritus Heme: negative for easy bruising and gum/nose bleeding MS: negative for myalgias, arthralgias, back pain and muscle weakness Neurolo:negative for headaches, dizziness, vertigo, memory problems  Psych:  anxiety, depression  Endocrine: negative for thyroid , diabetes Allergy/Immunology- negative for any medication or food allergies ? Objective:  VITALS:  BP 135/61   Pulse 77   Temp 97.7 F (36.5 C) (Oral)   SpO2 93%    PHYSICAL EXAM:  General: Alert, cooperative, no distress, frail and thin built -BMI low Head: Normocephalic, without obvious abnormality, atraumatic. Eyes: Conjunctivae clear, anicteric sclerae. Pupils are equal ENT Nares normal. No drainage or sinus tenderness. Lips, mucosa, and tongue normal. No Thrush dentures Neck: Supple, symmetrical, no adenopathy, thyroid : non tender no carotid bruit and no JVD. Back: No CVA tenderness. Lungs: b/l air entry crepts present Heart: s1s2 Abdomen: Soft, non-tender,not distended. Bowel sounds normal. No masses Extremities: atraumatic, no cyanosis. No edema. No clubbing Skin: No rashes or lesions. Or bruising Lymph: Cervical, supraclavicular normal. Neurologic: Grossly non-focal Pertinent Labs  none    Microbiology: 5/24 Sputum  Afb positive for MAI 02/04/23 -MAI  IMAGING RESULTS:  I have personally reviewed the films ?Extensive pleuropulmonary apical scarring   Left lingular cavitating mass    Impression/Recommendation ? ? 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 Because of the cavitary lesions started on nebulized amikacin  in June by Dr.Gonzalez Will check sputum for afb X 3  Worsenign cough since starting amikacin  nebs- followed by pulmonary  She had eye examination before starting Rx baseline acuity and color vision Had auditory test and getting hearing aids refer to ENT for evaluation of vestibulocochlear function assessment  Left lung mass- now diagnosed as non small cell cancer- she got 10 radiation   weight loss Patient has lost nearly 12 pounds since the start of treatment.  Advised high protein and high calorie diet    COPD  Tobacco use Patient continues to smoke about a pack a day.  -Encourage smoking cessation.  Discussed the management with patient in detail

## 2024-03-03 NOTE — Patient Instructions (Signed)
 You are ehre for follow up of MAC-pulmonary infection- you are on 3 different oral antibiotics and inhaled amikacin  for the past month- will repeat 3 sputums for afb- will do labs

## 2024-03-06 ENCOUNTER — Other Ambulatory Visit
Admission: RE | Admit: 2024-03-06 | Discharge: 2024-03-06 | Disposition: A | Discharge status: Home / Self Care | Source: Ambulatory Visit | Attending: Infectious Diseases | Admitting: Infectious Diseases

## 2024-03-06 DIAGNOSIS — A310 Pulmonary mycobacterial infection: Secondary | ICD-10-CM | POA: Diagnosis present

## 2024-03-06 LAB — COMPREHENSIVE METABOLIC PANEL WITH GFR
ALT: 11 U/L (ref 0–44)
AST: 16 U/L (ref 15–41)
Albumin: 3 g/dL — ABNORMAL LOW (ref 3.5–5.0)
Alkaline Phosphatase: 70 U/L (ref 38–126)
Anion gap: 7 (ref 5–15)
BUN: 11 mg/dL (ref 8–23)
CO2: 27 mmol/L (ref 22–32)
Calcium: 9.1 mg/dL (ref 8.9–10.3)
Chloride: 104 mmol/L (ref 98–111)
Creatinine, Ser: 0.77 mg/dL (ref 0.44–1.00)
GFR, Estimated: >60 mL/min (ref >60)
Glucose, Bld: 118 mg/dL — ABNORMAL HIGH (ref 70–99)
Potassium: 3.8 mmol/L (ref 3.5–5.1)
Sodium: 138 mmol/L (ref 135–145)
Total Bilirubin: 0.7 mg/dL (ref 0.0–1.2)
Total Protein: 7 g/dL (ref 6.5–8.1)

## 2024-03-06 LAB — CBC WITH DIFFERENTIAL/PLATELET
Abs Immature Granulocytes: 0.02 K/uL (ref 0.00–0.07)
Basophils Absolute: 0.1 K/uL (ref 0.0–0.1)
Basophils Relative: 1 %
Eosinophils Absolute: 0.4 K/uL (ref 0.0–0.5)
Eosinophils Relative: 7 %
HCT: 34.5 % — ABNORMAL LOW (ref 36.0–46.0)
Hemoglobin: 11.1 g/dL — ABNORMAL LOW (ref 12.0–15.0)
Immature Granulocytes: 0 %
Lymphocytes Relative: 13 %
Lymphs Abs: 0.8 K/uL (ref 0.7–4.0)
MCH: 30.7 pg (ref 26.0–34.0)
MCHC: 32.2 g/dL (ref 30.0–36.0)
MCV: 95.6 fL (ref 80.0–100.0)
Monocytes Absolute: 0.6 K/uL (ref 0.1–1.0)
Monocytes Relative: 9 %
Neutro Abs: 4.4 K/uL (ref 1.7–7.7)
Neutrophils Relative %: 70 %
Platelets: 290 K/uL (ref 150–400)
RBC: 3.61 MIL/uL — ABNORMAL LOW (ref 3.87–5.11)
RDW: 13.2 % (ref 11.5–15.5)
WBC: 6.2 K/uL (ref 4.0–10.5)
nRBC: 0 % (ref 0.0–0.2)

## 2024-03-16 ENCOUNTER — Ambulatory Visit
Admission: RE | Admit: 2024-03-16 | Discharge: 2024-03-16 | Disposition: A | Discharge status: Home / Self Care | Source: Ambulatory Visit | Attending: Oncology | Admitting: Oncology

## 2024-03-16 DIAGNOSIS — C3432 Malignant neoplasm of lower lobe, left bronchus or lung: Secondary | ICD-10-CM | POA: Insufficient documentation

## 2024-03-16 DIAGNOSIS — K639 Disease of intestine, unspecified: Secondary | ICD-10-CM | POA: Diagnosis not present

## 2024-03-16 DIAGNOSIS — R918 Other nonspecific abnormal finding of lung field: Secondary | ICD-10-CM | POA: Diagnosis not present

## 2024-03-16 LAB — GLUCOSE, CAPILLARY: Glucose-Capillary: 100 mg/dL — ABNORMAL HIGH (ref 70–99)

## 2024-03-16 MED ORDER — FLUDEOXYGLUCOSE F - 18 (FDG) INJECTION
5.7200 | Freq: Once | INTRAVENOUS | Status: AC | PRN
  Administered 2024-03-16: 5.72 via INTRAVENOUS

## 2024-03-19 ENCOUNTER — Emergency Department
Admission: EM | Admit: 2024-03-19 | Discharge: 2024-03-19 | Disposition: A | Discharge status: Home / Self Care | Source: Ambulatory Visit | Attending: Emergency Medicine | Admitting: Emergency Medicine

## 2024-03-19 ENCOUNTER — Other Ambulatory Visit: Payer: Self-pay

## 2024-03-19 ENCOUNTER — Emergency Department

## 2024-03-19 ENCOUNTER — Inpatient Hospital Stay: Attending: Oncology | Admitting: Nurse Practitioner

## 2024-03-19 DIAGNOSIS — C7951 Secondary malignant neoplasm of bone: Secondary | ICD-10-CM

## 2024-03-19 DIAGNOSIS — G9529 Other cord compression: Secondary | ICD-10-CM | POA: Diagnosis not present

## 2024-03-19 DIAGNOSIS — M489 Spondylopathy, unspecified: Secondary | ICD-10-CM

## 2024-03-19 DIAGNOSIS — Z85118 Personal history of other malignant neoplasm of bronchus and lung: Secondary | ICD-10-CM | POA: Insufficient documentation

## 2024-03-19 DIAGNOSIS — M898X8 Other specified disorders of bone, other site: Secondary | ICD-10-CM | POA: Insufficient documentation

## 2024-03-19 DIAGNOSIS — M549 Dorsalgia, unspecified: Secondary | ICD-10-CM | POA: Diagnosis present

## 2024-03-19 LAB — BASIC METABOLIC PANEL WITH GFR
Anion gap: 8 (ref 5–15)
BUN: 14 mg/dL (ref 8–23)
CO2: 24 mmol/L (ref 22–32)
Calcium: 8.8 mg/dL — ABNORMAL LOW (ref 8.9–10.3)
Chloride: 101 mmol/L (ref 98–111)
Creatinine, Ser: 0.81 mg/dL (ref 0.44–1.00)
GFR, Estimated: >60 mL/min (ref >60)
Glucose, Bld: 137 mg/dL — ABNORMAL HIGH (ref 70–99)
Potassium: 3.1 mmol/L — ABNORMAL LOW (ref 3.5–5.1)
Sodium: 133 mmol/L — ABNORMAL LOW (ref 135–145)

## 2024-03-19 LAB — CBC WITH DIFFERENTIAL/PLATELET
Abs Immature Granulocytes: 0.03 K/uL (ref 0.00–0.07)
Basophils Absolute: 0 K/uL (ref 0.0–0.1)
Basophils Relative: 1 %
Eosinophils Absolute: 0.2 K/uL (ref 0.0–0.5)
Eosinophils Relative: 2 %
HCT: 36.5 % (ref 36.0–46.0)
Hemoglobin: 11.6 g/dL — ABNORMAL LOW (ref 12.0–15.0)
Immature Granulocytes: 0 %
Lymphocytes Relative: 10 %
Lymphs Abs: 0.7 K/uL (ref 0.7–4.0)
MCH: 30.7 pg (ref 26.0–34.0)
MCHC: 31.8 g/dL (ref 30.0–36.0)
MCV: 96.6 fL (ref 80.0–100.0)
Monocytes Absolute: 0.6 K/uL (ref 0.1–1.0)
Monocytes Relative: 9 %
Neutro Abs: 5.3 K/uL (ref 1.7–7.7)
Neutrophils Relative %: 78 %
Platelets: 324 K/uL (ref 150–400)
RBC: 3.78 MIL/uL — ABNORMAL LOW (ref 3.87–5.11)
RDW: 12.8 % (ref 11.5–15.5)
WBC: 6.8 K/uL (ref 4.0–10.5)
nRBC: 0 % (ref 0.0–0.2)

## 2024-03-19 MED ORDER — GADOBUTROL 1 MMOL/ML IV SOLN
4.0000 mL | Freq: Once | INTRAVENOUS | Status: AC | PRN
  Administered 2024-03-19: 4 mL via INTRAVENOUS

## 2024-03-19 MED ORDER — PANTOPRAZOLE SODIUM 40 MG PO TBEC: 40.0000 mg | DELAYED_RELEASE_TABLET | Freq: Two times a day (BID) | ORAL | 0 refills | Status: AC

## 2024-03-19 MED ORDER — FENTANYL CITRATE PF 50 MCG/ML IJ SOSY
12.5000 ug | PREFILLED_SYRINGE | Freq: Once | INTRAMUSCULAR | Status: AC
  Administered 2024-03-19: 12.5 ug via INTRAVENOUS
  Filled 2024-03-19: qty 1

## 2024-03-19 MED ORDER — LIDOCAINE 5 % EX PTCH: 1.0000 | MEDICATED_PATCH | Freq: Two times a day (BID) | CUTANEOUS | 0 refills | Status: DC

## 2024-03-19 MED ORDER — ONDANSETRON HCL 4 MG/2ML IJ SOLN
4.0000 mg | Freq: Once | INTRAMUSCULAR | Status: AC
  Administered 2024-03-19: 4 mg via INTRAVENOUS
  Filled 2024-03-19: qty 2

## 2024-03-19 MED ORDER — DEXAMETHASONE 4 MG PO TABS: 8.0000 mg | ORAL_TABLET | Freq: Three times a day (TID) | ORAL | 0 refills | Status: AC

## 2024-03-19 NOTE — ED Provider Notes (Signed)
 [facility] Provider Note    Event Date/Time   First MD Initiated Contact with Patient 03/19/24 1326     (approximate)   History   Back Pain   HPI  Alejandra Ortiz is a 73 y.o. female with history of lung cancer currently status post radiation who comes in with concerns for back pain with abnormal PET scan.  Patient reports having some back pain for the past month.  Denies any symptoms of cord compression.  She reports only taking ibuprofen for pain as she does not like the way pain medication makes her feel.  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 I'm also concerned that there is compression of her spinal cord from that mass. I recommend she go to Emergency room for stat MRI w wo contrast. I've reached out to radiation oncology and neurosurgery. Anticipate that she will need steroids and possible decompressive surgery.   Physical Exam   Triage Vital Signs: ED Triage Vitals  Encounter Vitals Group     BP 03/19/24 1324 (!) 141/85     Girls Systolic BP Percentile --      Girls Diastolic BP Percentile --      Boys Systolic BP Percentile --      Boys Diastolic BP Percentile --      Pulse Rate 03/19/24 1324 93     Resp 03/19/24 1324 17     Temp 03/19/24 1324 97.8 F (36.6 C)     Temp src --      SpO2 03/19/24 1324 100 %     Weight 03/19/24 1325 80 lb 0.4 oz (36.3 kg)     Height 03/19/24 1325 5' 2 (1.575 m)     Head Circumference --      Peak Flow --      Pain Score 03/19/24 1325 10     Pain Loc --      Pain Education --      Exclude from Growth Chart --     Most recent vital signs: Vitals:   03/19/24 1324  BP: (!) 141/85  Pulse: 93  Resp: 17  Temp: 97.8 F (36.6 C)  SpO2: 100%     General: Awake, no distress.  CV:  Good peripheral perfusion.  Resp:  Normal effort.  Abd:  No distention.  Soft and  nontender Other:  Equal strength in arms.  Equal strength in legs.  Sensation intact.  Moving hands and feet.  No saddle anesthesia.   ED Results / Procedures / Treatments   Labs (all labs ordered are listed, but only abnormal results are displayed) Labs Reviewed  CBC WITH DIFFERENTIAL/PLATELET - Abnormal; Notable for the following components:      Result Value   RBC 3.78 (*)    Hemoglobin 11.6 (*)    All other components within normal limits  BASIC METABOLIC PANEL WITH GFR - Abnormal; Notable for the following components:   Sodium 133 (*)    Potassium 3.1 (*)    Glucose, Bld 137 (*)    Calcium  8.8 (*)    All other components within normal limits     RADIOLOGY I have reviewed the xray personally and interpreted    PROCEDURES:  Critical Care performed: No  Procedures   MEDICATIONS ORDERED IN ED: Medications  fentaNYL  (SUBLIMAZE ) injection 12.5 mcg (12.5 mcg Intravenous Given 03/19/24 1405)  ondansetron  (ZOFRAN )  injection 4 mg (4 mg Intravenous Given 03/19/24 1405)     IMPRESSION / MDM / ASSESSMENT AND PLAN / ED COURSE  I reviewed the triage vital signs and the nursing notes.   Patient's presentation is most consistent with acute presentation with potential threat to life or bodily function.   Patient comes in with concerns for back pain with abnormal PET scan.  Patient does not have any obvious signs of cord compression based upon examination.  Discussed with Dr. Katrina and he was okay with just doing the MRI thoracic with and without contrast.  Patient was given some IV fentanyl  to help with pain but states that she does not like taking oral pain medications at home as that she does not like the way it makes her feel.  She wanted me to start with a very low-dose IV pain medication here in the hospital.  BMP is reassuring CBC is reassuring  I discussed the case with Dr. B from oncology and he want to wait for the MRI results to decide if patient will need steroids,  radiation.  Patient be handed off to oncoming team pending the results of imaging and further discussion with neurosurgery, oncology    FINAL CLINICAL IMPRESSION(S) / ED DIAGNOSES   Final diagnoses:  Mass of thoracic vertebra     Rx / DC Orders   ED Discharge Orders     None        Note:  This document was prepared using Dragon voice recognition software and may include unintentional dictation errors.   Ernest Ronal BRAVO, MD 03/19/24 [phone]

## 2024-03-19 NOTE — ED Provider Notes (Signed)
-----------------------------------------   3:25 PM on 03/19/2024 -----------------------------------------  Blood pressure 139/64, pulse 99, temperature 97.8 F (36.6 C), resp. rate (!) 23, height 5' 2 (1.575 m), weight 36.3 kg, SpO2 99%.  Assuming care from Dr. Ernest.  In short, Alejandra Ortiz is a 73 y.o. female with a chief complaint of Back Pain .  Refer to the original H&P for additional details.  The current plan of care is to follow-up MR thoracic spine for new mass.  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  Patient reports minimal pain on reassessment and is eager for discharge home.  Findings reviewed with Dr. Clois of neurosurgery as well as Dr. Rennie of oncology, who recommend 8 mg of Decadron  3 times daily along with pantoprazole .  Patient declines pain medication, was counseled to follow-up with oncology and to return to the ED for new or worsening symptoms.  Patient and family agree with plan.       Alejandra Dunnings, MD 03/19/24 [phone]

## 2024-03-19 NOTE — Progress Notes (Signed)
 Virtual Visit Progress Note  Symptom Management Clinic  [facility] at [facility] A Department of the [place]. [facility] [address] [place], [place] [address] [phone] (phone) [phone] (fax)  I connected with SANJA ELIZARDO on 03/19/24 at  2:30 PM EDT by telephone visit and verified that I am speaking with the correct person using two identifiers.   I discussed the limitations, risks, security and privacy concerns of performing an evaluation and management service by telemedicine and the availability of in-person appointments. I also discussed with the patient that there may be a patient responsible charge related to this service. The patient expressed understanding and agreed to proceed.   Other persons participating in the visit and their role in the encounter: none   Patient's location: home  Provider's location: clinic   Chief Complaint: PET results    Patient Care Team: Antonette Angeline ORN, NP as PCP - General (Internal Medicine) Bluford Jacqulyn MATSU, DO as Consulting Physician (Family Medicine) Pa, [facility] (Optometry) Verdene Gills, RN as Oncology Nurse Navigator Lenn Aran, MD as Consulting Physician (Radiation Oncology) Jacobo Evalene PARAS, MD as Consulting Physician (Oncology)   Name of the patient: Alejandra Ortiz  [id number]  [birth year]   Date of visit: 03/19/24  Diagnosis- Lung Cancer  Heme/Onc history:  Oncology History  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 She is complaining of worsening back pain over the past  month. 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 She underwent radiation to lung completed 12/16/23. Chemo was held d/t her MAC infection.   Review of systems- Review of Systems  Constitutional:  Positive for malaise/fatigue.  Respiratory:  Positive for cough and shortness of breath.     No Known Allergies  Past Medical History:  Diagnosis Date   Chicken pox    COPD (chronic obstructive pulmonary disease) (HCC)    Hyperlipidemia    Measles    Past Surgical History:  Procedure Laterality Date   TUBAL LIGATION  1978   Social History   Socioeconomic History   Marital status: Married    Spouse name: Jeryl Wilbourn   Number of children: 5   Years of education: Not on file   Highest education level: Not on file  Occupational History   Occupation: Knit    Comment: [employer]  Tobacco Use   Smoking status: Every Day    Current packs/day: 1.00    Average packs/day: 1 pack/day for 48.0 years (48.0 ttl pk-yrs)    Types: Cigarettes   Smokeless tobacco: Never   Tobacco comments:    1 PPD - khj 09/20/2023  Vaping Use   Vaping status: Never Used  Substance and Sexual Activity   Alcohol use: No    Alcohol/week: 0.0 standard drinks of alcohol   Drug use: No   Sexual activity: Never    Birth control/protection: Surgical  Other Topics Concern   Not on file  Social History Narrative   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   Lives with husband and 5  children with 12 grandchildren, 4 great-grandchildren   1 dog lives inside    Associates Degree   Right hand dominant    Enjoys reading    Social Drivers of Health   Financial Resource Strain: Low Risk  (08/30/2023)   Overall Financial Resource Strain (CARDIA)    Difficulty of Paying Living Expenses: Not hard at all  Food Insecurity: No Food Insecurity (08/30/2023)   Hunger Vital Sign    Worried About Running Out of Food in the Last Year: Never true    Ran Out of Food in the Last Year:  Never true  Transportation Needs: No Transportation Needs (08/30/2023)   PRAPARE - Administrator, Civil Service (Medical): No    Lack of Transportation (Non-Medical): No  Physical Activity: Insufficiently Active (08/30/2023)   Exercise Vital Sign    Days of Exercise per Week: 3 days    Minutes of Exercise per Session: 30 min  Stress: No Stress Concern Present (08/30/2023)   [employer] of Occupational Health - Occupational Stress Questionnaire    Feeling of Stress : Not at all  Social Connections: Moderately Isolated (08/30/2023)   Social Connection and Isolation Panel    Frequency of Communication with Friends and Family: Once a week    Frequency of Social Gatherings with Friends and Family: More than three times a week    Attends Religious Services: Never    Database administrator or Organizations: No    Attends Banker Meetings: Never    Marital Status: Married  Catering manager Violence: Not At Risk (08/30/2023)   Humiliation, Afraid, Rape, and Kick questionnaire    Fear of Current or Ex-Partner: No    Emotionally Abused: No    Physically Abused: No    Sexually Abused: No   Family History  Family history unknown: Yes    Current Outpatient Medications:    albuterol  (PROVENTIL ) (2.5 MG/3ML) 0.083% nebulizer solution, Take 3 mLs (2.5 mg total) by nebulization every 6 (six) hours as needed for wheezing or shortness of breath., Disp: 75 mL, Rfl: 5   albuterol  (VENTOLIN  HFA) 108 (90 Base) MCG/ACT inhaler, Inhale 2 puffs into the lungs every 6 (six) hours as needed for wheezing or shortness of breath., Disp: 54 g, Rfl: 0   Amikacin  Sulfate Liposome (ARIKAYCE ) 590 MG/8.4ML SUSP, Inhale 590 mg into the lungs daily., Disp: , Rfl:    aspirin  EC 81 MG tablet, Take 1 tablet (81 mg total) by mouth daily. Swallow whole., Disp: 30 tablet, Rfl: 12   azithromycin  (ZITHROMAX ) 250 MG tablet, Take 1 tablet (250 mg total) by mouth daily. For mycobacterium avium treatmentFor  mycobacterium avium treatment, Disp: 30 each, Rfl: 2   ethambutol  (MYAMBUTOL ) 400 MG tablet, TAKE 1 AND 1/2 TABLETS(600 MG) BY MOUTH DAILY, Disp: 45 tablet, Rfl: 2   rifampin  (RIFADIN ) 150 MG capsule, Take 3 capsules (450 mg total) by mouth daily., Disp: 90 capsule, Rfl: 2   TRELEGY ELLIPTA  200-62.5-25 MCG/ACT AEPB, USE 1 INHALATION DAILY, Disp: 180 each, Rfl: 3  Physical exam: Exam limited due to telemedicine There were no vitals filed for this visit. Physical Exam Neurological:     Mental Status: She is alert and oriented to person, place, and time.     I reviewed PET images as below:  NM PET Image Restag (PS) Skull Base To Thigh Addendum Date: 03/19/2024 ADDENDUM REPORT: 03/19/2024 12:28 ADDENDUM: Critical Value/emergent results were called by telephone at the time of interpretation on 03/19/2024 at 12:27  pm to provider Tinnie Dawn, who verbally acknowledged these results. Electronically Signed   By: Ranell Bring M.D.   On: 03/19/2024 12:28   Result Date: 03/19/2024 CLINICAL DATA:  Subsequent treatment strategy for lung cancer. EXAM: NUCLEAR MEDICINE PET SKULL BASE TO THIGH TECHNIQUE: 5.72 mCi F-18 FDG was injected intravenously. Full-ring PET imaging was performed from the skull base to thigh after the radiotracer. CT data was obtained and used for attenuation correction and anatomic localization. Fasting blood glucose: 100 mg/dl COMPARISON:  PET-CT 96/81/7974 FINDINGS: Mediastinal blood pool activity: SUV max 1.9 Liver activity: SUV max 2.2 NECK: The left hypermetabolic retroauricular soft tissue nodule or node is again seen. Previously maximum SUV 11.4 and dimension of of 1.4 cm. Today maximum SUV of 16.0 and maximum dimension of 17 mm on series 6, image 11. No specific other areas of abnormal uptake in the neck including along lymph node change of the submandibular, posterior triangle or internal jugular regions. Incidental CT findings: Visualized portions of the paranasal sinuses and mastoid  air cells are clear except for Passy along the small left maxillary sinus. Opacity is more confluent today. Mild vascular calcifications. Submandibular glands are unremarkable. Small thyroid  gland. CHEST: Bilateral cavitary lesions are again seen. The left lower lobe focus which previously had maximum SUV value of 20.6 and dimension of 4.8 x 3.6 cm, today has decreased uptake with maximum SUV value 2.2. Lesion today on image 63 of series 6 measures 3.0 by 2.2 cm. This abuts the pleura. The previous air in the lesion is resolved. Is also a decreased left pleural effusion. However there are new nodular areas uptake along the pleura with some thickening of these 4 such foci are identified. Example has maximum SUV of 7.6 on image 74. Dimension proximally 10 mm. Additional small focus left lung apex posteriorly along the pleura with maximum SUV of 3.8 on image 30. The right upper lobe cavitary focus on the prior had uptake along its medial wall of maximum SUV value of 5.0 and dimension of 4.2 x 4.1 cm. Today this same area has maximum SUV of 4.6 along the superomedial aspect of lesion and dimension on image 32 of series 6 proximally 4.6 by 4.2 cm. There are other areas around the lesion which shows slightly more uptake including a nodular focus anteriorly maximum SUV of 5.8 in this location today and previously 3.9. The nodules along the medial aspect this lesion extending to the hilum previously had less uptake and today maximum SUV of 3.5 as seen on image 39. Other areas of nodularity identified such as in the superior segment of the lower lobes is similar to previous and does not show significant uptake. No specific abnormal uptake seen above blood pool in the axillary region, hilum or mediastinum. Incidental CT findings: Advanced emphysematous lung changes identified. No pneumothorax. Heart is nonenlarged. Coronary artery calcifications are seen. Trace pericardial fluid. Normal caliber slightly patulous thoracic  esophagus. Biapical pleural thickening with calcifications. ABDOMEN/PELVIS: Physiologic distribution radiotracer along the parenchymal organs and renal collecting systems. The area of uptake along rectosigmoid region which previously measured 8.7 SUV maximum, today 11.5. Again worrisome for potential bowel neoplasm. Please correlate with prior or dedicated uptake when able. No other areas of abnormal bowel uptake. However there is a new hypermetabolic subcutaneous fat nodule on the right side posteriorly with maximum SUV value of 3.7 and measuring 4 mm on series 6, image 99. A new soft tissue metastasis is possible but there is a differential. Incidental CT  findings: Diffuse vascular calcifications. No bowel obstruction. Diffuse anasarca. Gallbladder is distended. Grossly the liver, spleen, adrenal glands are unremarkable. No abnormal calcifications seen within either kidney nor along the course of either ureter. Colonic diverticula. SKELETON: There is a new abnormal area of uptake of maximum SUV of 18.1 corresponding to a large soft tissue destructive mass involving the posterior elements at the T7 level. There is soft tissue extending along the central canal and neural foramen. Large area of bone destruction identified. Please correlate for the stability of this lesion. No other areas of abnormal bony uptake. Incidental CT findings: Scattered degenerative changes. Critical Value/emergent results were called by telephone at the time of interpretation on 03/19/2024 at 11:14 am to provider [facility] , who verbally acknowledged these results. IMPRESSION: Overall progression of disease. New destructive hypermetabolic bony lesion involving the posterior limb of the left side at T7 with soft tissue extending into the central canal and neural foramen. Recommend additional workup with MRI to assess for cord compression and bony stability of the spine. Developing hypermetabolic nodules along the left side of the  pleura. Four lesions seen at the left lung base and 1 at the left lung apex. New subcutaneous fat right-sided lower abdominal posterior hypermetabolic nodule. This has a differential although a soft tissue metastasis is possible. Left lower lobe cavitary lesion appears smaller has less uptake consistent with an area of improved disease. The cavitary lesion in the right upper lobe is similar in size. Some of the areas of uptake along its margin are slightly increased as well as slight increase in the adjacent nodularity. Slight increase in size and uptake of a left-sided postauricular lymph node or nodule in the neck. Persistent nodule with uptake along the rectosigmoid colon please correlate with a separate bowel lesion. Electronically Signed: By: Ranell Bring M.D. On: 03/19/2024 11:16      Media Information   Assessment and plan- Patient is a 73 y.o. 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 I'm also concerned that there is compression of her spinal cord from that mass. I recommend she go to Emergency room for stat MRI w wo contrast. I've reached out to radiation oncology and neurosurgery. Anticipate that she will need steroids and possible decompressive surgery.   ER notified.   Visit Diagnosis 1. 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 She also understands that She can call clinic at any time with any questions, concerns, or complaints.   I discussed the assessment and treatment plan with the patient. The patient was provided an opportunity to ask questions and all were answered. The patient agreed with the plan and demonstrated  an understanding of the instructions.   The patient was advised to call back or seek an in-person evaluation if the symptoms worsen or if the condition fails to improve as anticipated.  I spent 20 minutes on this telephone encounter.   Thank you for allowing me to participate in the care of this very pleasant patient.   Tinnie Dawn, DNP, AGNP-C [facility]  CC: Dr Jacobo

## 2024-03-19 NOTE — Consult Note (Signed)
 Consult requested by:  Dr. Willo  Consult requested for:  T7 lesion  Primary Physician:  Antonette Angeline ORN, NP  History of Present Illness: 03/19/2024 Alejandra Ortiz is here today with a chief complaint of back pain worsening over the past month or so.  She specifically denies any numbness or tingling in her legs.  She has no weakness.  She does have some fatigue.  She has had some difficulty with cough.  She has known lung cancer.  She is status post radiation treatment for this.  She is unable to get chemotherapy due to her indolent mycobacterial infection.  She recently underwent a PET study which showed a new lesion in the spine.  She was advised to come to the emergency department for evaluation.  Alejandra Ortiz has no symptoms of cervical myelopathy.  The symptoms are causing a significant impact on the patient's life.   I have utilized the care everywhere function in epic to review the outside records available from external health systems.  Review of Systems:  A 10 point review of systems is negative, except for the pertinent positives and negatives detailed in the HPI.  Past Medical History: Past Medical History:  Diagnosis Date   Chicken pox    COPD (chronic obstructive pulmonary disease) (HCC)    Hyperlipidemia    Measles     Past Surgical History: Past Surgical History:  Procedure Laterality Date   TUBAL LIGATION  1978    Allergies: Allergies as of 03/19/2024   (No Known Allergies)    Medications: No outpatient medications have been marked as taking for the 03/19/24 encounter [facility]counter).    Social History: Social History   Tobacco Use   Smoking status: Every Day    Current packs/day: 1.00    Average packs/day: 1 pack/day for 48.0 years (48.0 ttl pk-yrs)    Types: Cigarettes   Smokeless tobacco: Never   Tobacco comments:    1 PPD - khj 09/20/2023  Vaping Use   Vaping status: Never Used  Substance Use Topics   Alcohol use: No     Alcohol/week: 0.0 standard drinks of alcohol   Drug use: No    Family Medical History: Family History  Family history unknown: Yes    Physical Examination: Vitals:   03/19/24 1630 03/19/24 1700  BP: 91/76 104/62  Pulse: 76 77  Resp: (!) 22 (!) 22  Temp:    SpO2: 97% 99%    General: Patient is in no apparent distress. She is cachectic.   Neck:   Supple.  Full range of motion.  Respiratory: Patient is breathing without any difficulty.   NEUROLOGICAL:     Awake, alert, oriented to person, place, and time.  Speech is clear and fluent.  Cranial Nerves: intact  5/5 BLE SILT BLE  Bilateral upper and lower extremity sensation is intact to light touch.     Gait is untested.     Medical Decision Making  Imaging: MRI T spine w contrast 03/19/2024 IMPRESSION: 1. 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 There is mild central spinal canal stenosis, but no spinal cord impingement. 2. Cavitary lesion within the right upper lung zone, compatible with primary lung cancer.     Electronically Signed   By: Evalene Coho M.D.   On: 03/19/2024 15:37   I have personally reviewed the images and agree with the above interpretation.  Assessment and Plan: Alejandra Ortiz is a pleasant 73 y.o. female with newly diagnosed metastatic disease in the spine.   there is no evidence of pathologic fracture.  There is minimal epidural component.  I do not think she is a good candidate for surgical intervention.  I have recommended that she follow with Dr. Chrystal for radiation treatment.  I will remain available if needed for surgical consultation at any point in the future.    I have communicated my recommendations to the requesting physician and coordinated care to facilitate these recommendations.     Amour Trigg K. Clois MD, [facility] Neurosurgery

## 2024-03-19 NOTE — ED Triage Notes (Signed)
 Pt comes in via pov with complaints of back pain. Pt was sent over by her cancer doctor due to having a possible lumbar lesion. Her doctor is wanting her to have a MRI. Pt is alert and oriented x4, with family at the decide. Pt complains of pain 10/10. Pt is currently on 3 different antibiotics for MAC in her lungs. Pt's last radiation treatment was 3 months ago. Pt has a history of lung cancer, copd and emphysema.

## 2024-03-23 ENCOUNTER — Ambulatory Visit: Admitting: Oncology

## 2024-03-23 ENCOUNTER — Ambulatory Visit (INDEPENDENT_AMBULATORY_CARE_PROVIDER_SITE_OTHER): Admitting: Internal Medicine

## 2024-03-23 ENCOUNTER — Ambulatory Visit

## 2024-03-23 ENCOUNTER — Encounter: Payer: Self-pay | Admitting: Internal Medicine

## 2024-03-23 VITALS — BP 98/68 | HR 86 | Ht 62.0 in | Wt 77.0 lb

## 2024-03-23 DIAGNOSIS — G893 Neoplasm related pain (acute) (chronic): Secondary | ICD-10-CM

## 2024-03-23 DIAGNOSIS — E43 Unspecified severe protein-calorie malnutrition: Secondary | ICD-10-CM

## 2024-03-23 DIAGNOSIS — C3491 Malignant neoplasm of unspecified part of right bronchus or lung: Secondary | ICD-10-CM | POA: Diagnosis not present

## 2024-03-23 DIAGNOSIS — C7951 Secondary malignant neoplasm of bone: Secondary | ICD-10-CM

## 2024-03-23 MED ORDER — TRAMADOL HCL 50 MG PO TABS: 50.0000 mg | ORAL_TABLET | Freq: Three times a day (TID) | ORAL | 0 refills | Status: AC | PRN

## 2024-03-23 NOTE — Patient Instructions (Signed)
 Bone Metastasis  Bone metastasis is cancer that has spread from the part of the body where it started to the bones. A person may have bone metastasis in one bone or in more than one bone. Cancer that spreads to the bones is different from cancer that starts in the bones (primary bone cancer). Bone metastasis is more common than primary bone cancer. The spine is the most common area for bone metastasis to occur. Other common areas include: Hip bone (pelvis). Ribs. Skull. Long bones of the arm or leg. Bone metastasis is painful. It also damages and weakens bones so that they may break more easily, even from a minor injury. What are the causes? This condition is caused by cancer cells that spread to the bone. Cancer cells can spread to the rest of the body in two ways: Through the bloodstream. Through the vessels that carry white blood cells in the body (lymphatic system). What increases the risk? This condition is more likely to develop in people who have an advanced type of cancer that is known to spread to bone. Cancers that often spread to bone include: Breast cancer. Prostate cancer. Lung cancer. Thyroid  cancer. Kidney cancer. Multiple myeloma. Lymphoma. What are the signs or symptoms? The most common symptom of this condition is bone pain, especially while you are resting. Other symptoms include: A broken bone (fracture) that happens with little or no trauma. Low number of red blood cells (anemia). Bone destruction may damage the spongy tissue (bone marrow) in the center of bones where red blood cells are produced. Anemia can cause: Weakness. Shortness of breath. Headache. Dizziness. Back or neck pain with numbness or weakness. High levels of calcium  in your blood (hypercalcemia). When bone is destroyed, calcium  is released into your blood. Symptoms of hypercalcemia include: Constipation. Thirst. Nausea. Sleepiness. How is this diagnosed? This condition may be diagnosed based  on: Your symptoms and medical history. Your health care provider may suspect this condition if you are being treated for cancer or have had cancer treatment in the past. A physical exam. Imaging studies, such as: Bone X-rays, especially in the area where you have pain. CT scan. Bone scan. MRI. PET scan. Blood tests. Urine tests. A procedure to remove a piece of bone so it can be examined under a microscope (biopsy). How is this treated? Treatment for this condition depends on your overall health, the type of cancer you have, and how much the cancer has spread. You will work with a team of health care providers to determine which treatment is best for you. Treatment will focus on managing pain, preventing bone weakness, and slowing the spread of the cancer. Treatment may include: Radiation therapy. This treatment uses X-rays to kill cancer cells. It is most effective for reducing pain, stopping tumor growth, and lowering the risk of fractures. Radioisotope therapy. This treatment uses a radioactive medicine that is injected into your blood. The medicine travels to areas where cancer cells are active and kills them. Chemotherapy. For this treatment, you are given cancer-killing medicines. You may have chemotherapy in cycles, with rest periods in between. Medicines that: Help build bone (bisphosphonates and denosumab). These medicines are used to make bones stronger and control bone pain. They may also help to reduce hypercalcemia. Reduce pain (opiates). Endocrine therapies. These therapies slow cancer growth by blocking specific chemical messengers (hormones). Some types of cancer, including breast and prostate cancers, may grow because of hormones in the body. Targeted therapy. These drugs are used to block  the growth and spread of cancer cells. These drugs target a specific part of the cancer cell and usually cause fewer side effects than chemotherapy. Immunotherapies. These therapies use the  body's defense system (immune system) to fight cancer cells. Surgery. You may have surgery to remove bone cancer or to prevent or repair a fracture. Follow these instructions at home:  Take over-the-counter and prescription medicines only as told by your health care provider. Ask your health care provider if the medicine prescribed to you: Requires you to avoid driving or using machinery. Can cause constipation. You may need to take these actions to prevent or treat constipation: Drink enough fluid to keep your urine pale yellow. Take over-the-counter or prescription medicines. Eat foods that are high in fiber, such as beans, whole grains, and fresh fruits and vegetables. Limit foods that are high in fat and processed sugars, such as fried or sweet foods. Ask your health care provider what activities are safe for you. Use devices to help you move around (mobility aids) as needed, such as canes, walkers, or scooters. Do not drink alcohol. Do not use any products that contain nicotine or tobacco. These products include cigarettes, chewing tobacco, and vaping devices, such as e-cigarettes. If you need help quitting, ask your health care provider. Keep all follow-up visits. This is important. Where to find more information American Cancer Society: www.cancer.org Contact a health care provider if: Your pain medicine is not helping. You are not able to care for yourself at home. Get help right away if: You fall or have an injury. Your pain suddenly gets worse. You have trouble walking. You have numbness or tingling in your legs. You lose control of your bowels or your bladder. You are very sleepy or confused. Summary Bone metastasis is cancer that has spread from the part of the body where it started to the bones. This condition is more common than cancer that starts in the bones (primary bone cancer). Bone metastasis is painful and damages and weakens the bones. It can also cause anemia and  hypercalcemia. Treatment for this condition depends on your overall health, the type of cancer you have, and how much the cancer has spread. Work with your health care team to determine which treatment is best for you. Take all medicines only as told by your health care provider. This information is not intended to replace advice given to you by your health care provider. Make sure you discuss any questions you have with your health care provider. Document Revised: 04/12/2021 Document Reviewed: 04/12/2021 Elsevier Patient Education  2024 [employer].

## 2024-03-23 NOTE — Progress Notes (Signed)
 Subjective:    Patient ID: Alejandra Ortiz, female    DOB: 1951/06/24, 73 y.o.   MRN: [id number]  HPI  Discussed the use of AI scribe software for clinical note transcription with the patient, who gave verbal consent to proceed.   Alejandra Ortiz is a 73 year old female with metastatic lung cancer who presents with severe back pain. She is accompanied by her daughter.  She reports she recently had a PET scan 03/16/2024.  Once the results came in, she was advised to go to the ER for emergent MRI of the thoracic spine.  She experiences severe back pain originating in the upper back, described as 'pinched nerves everywhere.' The pain is persistent and debilitating.  A recent CT scan, performed four days ago, revealed metastatic disease to the T7 vertebral body involving the posterior elements on the left with a mild epidural component and paraspinous component. There is mild central spinal canal stasis but no spinal cord impingement. Additionally, a cavitary lesion within the right upper lung zone is compatible with primary lung cancer.  At the ER, he was given lidocaine  patches and IM injection of dexamethasone  for pain management. She has not yet followed up with her oncologist due to a missed appointment. She has an upcoming appointment with radiation oncology tomorrow and with her oncologist August 4..  She has tried extra strength tylenol and 800 mg ibuprofen for pain, but these have not provided sufficient relief.  She has a history of adverse reactions to narcotic pain medications, such as Roxicodone, which made her feel sick. She prefers to avoid narcotics to maintain mental clarity and has not tried tramadol  yet.     Review of Systems     Past Medical History:  Diagnosis Date   Chicken pox    COPD (chronic obstructive pulmonary disease) (HCC)    Hyperlipidemia    Measles     Current Outpatient Medications  Medication Sig Dispense Refill   albuterol  (PROVENTIL ) (2.5 MG/3ML)  0.083% nebulizer solution Take 3 mLs (2.5 mg total) by nebulization every 6 (six) hours as needed for wheezing or shortness of breath. 75 mL 5   albuterol  (VENTOLIN  HFA) 108 (90 Base) MCG/ACT inhaler Inhale 2 puffs into the lungs every 6 (six) hours as needed for wheezing or shortness of breath. 54 g 0   Amikacin  Sulfate Liposome (ARIKAYCE ) 590 MG/8.4ML SUSP Inhale 590 mg into the lungs daily.     aspirin  EC 81 MG tablet Take 1 tablet (81 mg total) by mouth daily. Swallow whole. 30 tablet 12   azithromycin  (ZITHROMAX ) 250 MG tablet Take 1 tablet (250 mg total) by mouth daily. 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 42 tablet 0   ethambutol  (MYAMBUTOL ) 400 MG tablet TAKE 1 AND 1/2 TABLETS(600 MG) BY MOUTH DAILY 45 tablet 2   lidocaine  (LIDODERM ) 5 % Place 1 patch onto the skin every 12 (twelve) hours. Remove & Discard patch within 12 hours or as directed by MD 10 patch 0   pantoprazole  (PROTONIX ) 40 MG tablet Take 1 tablet (40 mg total) by mouth 2 (two) times daily before a meal for 14 days. 28 tablet 0   rifampin  (RIFADIN ) 150 MG capsule Take 3 capsules (450 mg total) by mouth daily. 90 capsule 2   TRELEGY ELLIPTA  200-62.5-25 MCG/ACT AEPB USE 1 INHALATION DAILY 180 each  3   No current facility-administered medications for this visit.    No Known Allergies  Family History  Family history unknown: Yes    Social History   Socioeconomic History   Marital status: Married    Spouse name: [employer]   Number of children: 5   Years of education: Not on file   Highest education level: Not on file  Occupational History   Occupation: Knit    Comment: [employer]  Tobacco Use   Smoking status: Every Day    Current packs/day: 1.00    Average packs/day: 1 pack/day for 48.0 years (48.0 ttl pk-yrs)    Types: Cigarettes   Smokeless tobacco:  Never   Tobacco comments:    1 PPD - khj 09/20/2023  Vaping Use   Vaping status: Never Used  Substance and Sexual Activity   Alcohol use: No    Alcohol/week: 0.0 standard drinks of alcohol   Drug use: No   Sexual activity: Never    Birth control/protection: Surgical  Other Topics Concern   Not on file  Social History Narrative   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   Lives with husband and 5 children with 12 grandchildren, 4 great-grandchildren   1 dog lives inside    Associates Degree   Right hand dominant    Enjoys reading    Social Drivers of Corporate investment banker Strain: Low Risk  (08/30/2023)   Overall Financial Resource Strain (CARDIA)    Difficulty of Paying Living Expenses: Not hard at all  Food Insecurity: No Food Insecurity (08/30/2023)   Hunger Vital Sign    Worried About Running Out of Food in the Last Year: Never true    Ran Out of Food in the Last Year: Never true  Transportation Needs: No Transportation Needs (08/30/2023)   PRAPARE - Administrator, Civil Service (Medical): No    Lack of Transportation (Non-Medical): No  Physical Activity: Insufficiently Active (08/30/2023)   Exercise Vital Sign    Days of Exercise per Week: 3 days    Minutes of Exercise per Session: 30 min  Stress: No Stress Concern Present (08/30/2023)   [employer] of Occupational Health - Occupational Stress Questionnaire    Feeling of Stress : Not at all  Social Connections: Moderately Isolated (08/30/2023)   Social Connection and Isolation Panel    Frequency of Communication with Friends and Family: Once a week    Frequency of Social Gatherings with Friends and Family: More than three times a week    Attends Religious Services: Never    Database administrator or Organizations: No    Attends Banker Meetings: Never    Marital Status: Married  Catering manager Violence: Not At Risk (08/30/2023)   Humiliation, Afraid, Rape, and Kick questionnaire    Fear of  Current or Ex-Partner: No    Emotionally Abused: No    Physically Abused: No    Sexually Abused: No     Constitutional: Patient reports fatigue and weight loss.  Denies fever, malaise, headache.  Respiratory: Patient reports chronic cough.  Denies difficulty breathing, shortness of breath.   Cardiovascular: Denies chest pain, chest tightness, palpitations or swelling in the hands or feet.  Musculoskeletal: Patient reports thoracic back pain.  Denies decrease in range of motion, difficulty with gait, muscle pain or joint  swelling.  Skin: Denies redness, rashes, lesions or ulcercations.  Neurological: Denies dizziness, difficulty with memory, difficulty with speech or problems  with balance and coordination.    No other specific complaints in a complete review of systems (except as listed in HPI above).  Objective:   Physical Exam  BP 98/68 (BP Location: Right Arm, Patient Position: Sitting, Cuff Size: Normal)   Pulse 86   Ht 5' 2 (1.575 m)   Wt 77 lb (34.9 kg)   SpO2 98%   BMI 14.08 kg/m     Wt Readings from Last 3 Encounters:  03/19/24 80 lb 0.4 oz (36.3 kg)  01/13/24 80 lb (36.3 kg)  01/03/24 79 lb 12.8 oz (36.2 kg)    General: Appears her stated age, severely underweight, in NAD. Cardiovascular: Normal rate and rhythm.  Pulmonary/Chest: Normal effort and diminished breath sounds. No respiratory distress. No wheezes, rales or ronchi noted.  Musculoskeletal: Muscle wasting noted of upper and lower extremities.  Pain with palpation over the thoracic spine.  She has difficulty getting from a sitting to a standing position.  Shuffling gait without device. Neurological: Alert and oriented.    BMET    Component Value Date/Time   NA 133 (L) 03/19/2024 1407   NA 137 09/25/2013 0950   K 3.1 (L) 03/19/2024 1407   K 3.6 09/25/2013 0950   CL 101 03/19/2024 1407   CL 106 09/25/2013 0950   CO2 24 03/19/2024 1407   CO2 27 09/25/2013 0950   GLUCOSE 137 (H) 03/19/2024 1407    GLUCOSE 103 (H) 09/25/2013 0950   BUN 14 03/19/2024 1407   BUN 14 09/25/2013 0950   CREATININE 0.81 03/19/2024 1407   CREATININE 0.65 12/02/2023 1341   CALCIUM  8.8 (L) 03/19/2024 1407   CALCIUM  8.9 09/25/2013 0950   GFRNONAA >60 03/19/2024 1407   GFRNONAA >60 11/07/2023 1556   GFRNONAA 57 (L) 09/25/2013 0950   GFRAA >60 09/25/2013 0950    Lipid Panel     Component Value Date/Time   CHOL 157 12/02/2023 1341   TRIG 80 12/02/2023 1341   HDL 49 (L) 12/02/2023 1341   CHOLHDL 3.2 12/02/2023 1341   VLDL 12.0 03/17/2019 1401   LDLCALC 91 12/02/2023 1341    CBC    Component Value Date/Time   WBC 6.8 03/19/2024 1407   RBC 3.78 (L) 03/19/2024 1407   HGB 11.6 (L) 03/19/2024 1407   HGB 12.3 09/25/2013 0950   HCT 36.5 03/19/2024 1407   HCT 35.4 09/25/2013 0950   PLT 324 03/19/2024 1407   PLT 183 09/25/2013 0950   MCV 96.6 03/19/2024 1407   MCV 98 09/25/2013 0950   MCH 30.7 03/19/2024 1407   MCHC 31.8 03/19/2024 1407   RDW 12.8 03/19/2024 1407   RDW 12.4 09/25/2013 0950   LYMPHSABS 0.7 03/19/2024 1407   MONOABS 0.6 03/19/2024 1407   EOSABS 0.2 03/19/2024 1407   BASOSABS 0.0 03/19/2024 1407    Hgb A1C Lab Results  Component Value Date   HGBA1C 5.7 (H) 12/02/2023            Assessment & Plan:   Assessment and Plan    ER Followup fir Metastatic Lung Cancer with Bone Metastasis ER notes, labs and imaging reviewed Primary lung cancer with metastasis to T7 vertebral body causing significant back pain. Mild central spinal canal stenosis without cord impingement. She is underweight and avoids narcotics due to adverse reactions. - Prescribed tramadol  50 mg 3 times daily for pain, initial five-day supply. - Advised extra strength Tylenol up to 1000 mg every 8 hours as first-line pain management. - Recommended alternating Tylenol and ibuprofen,  not exceeding recommended dosages. - Coordinated with oncology for further management and pain control. - Scheduled follow-up with  oncologist on August 4th. - Scheduled radiation therapy on July 29th at 9 AM.       RTC in 3 months, follow-up chronic conditions Angeline Laura, NP

## 2024-03-24 ENCOUNTER — Ambulatory Visit
Admission: RE | Admit: 2024-03-24 | Discharge: 2024-03-24 | Disposition: A | Discharge status: Home / Self Care | Source: Ambulatory Visit | Admitting: Radiation Oncology

## 2024-03-24 ENCOUNTER — Ambulatory Visit
Admission: RE | Admit: 2024-03-24 | Discharge: 2024-03-24 | Disposition: A | Discharge status: Home / Self Care | Source: Ambulatory Visit | Attending: Radiation Oncology | Admitting: Radiation Oncology

## 2024-03-24 ENCOUNTER — Ambulatory Visit

## 2024-03-24 DIAGNOSIS — Z510 Encounter for antineoplastic radiation therapy: Secondary | ICD-10-CM | POA: Insufficient documentation

## 2024-03-24 DIAGNOSIS — C3490 Malignant neoplasm of unspecified part of unspecified bronchus or lung: Secondary | ICD-10-CM | POA: Insufficient documentation

## 2024-03-24 DIAGNOSIS — C3432 Malignant neoplasm of lower lobe, left bronchus or lung: Secondary | ICD-10-CM | POA: Diagnosis not present

## 2024-03-24 DIAGNOSIS — Z923 Personal history of irradiation: Secondary | ICD-10-CM | POA: Insufficient documentation

## 2024-03-24 DIAGNOSIS — C7951 Secondary malignant neoplasm of bone: Secondary | ICD-10-CM | POA: Diagnosis not present

## 2024-03-24 NOTE — Progress Notes (Signed)
 Radiation Oncology Follow up Note  Name: Alejandra Ortiz   Date:   03/24/2024 MRN:  [id number] DOB: 10-26-1950    This 73 y.o. 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  She had been doing well.  With PET scan back in March 2025 showing no significant change in metabolic activity within on the dominant cavity of the left lower lobe mass consistent with known primary bronchogenic carcinoma.  No evidence of metastatic disease in the abdomen or pelvis was noted.  She started presenting with significant back pain about a month ago was seen in the emergency room where she was having no evidence of lower extremity weakness.  PET scan performed unfortunately showed new destructive hypermetabolic lesion of T7 with soft tissue extension into the central canal and neuroforamina.  She also had hypermetabolic nodules along the left side of the pleura new subcutaneous far right sided lower abdominal posterior hypermetabolic nodule.  MRI scan confirmed metastatic disease to the T7 vertebral body involving the posterior elements of the left with mild epidural component and paraspinous component.  No spinal cord impingement was noted.  She was consulted by neurosurgery thought not to be a candidate based on no formal cord impingement and is now referred to radiation oncology for consideration of treatment.  She is ambulating with some difficulty.  She does not have any focal  neurologic deficits in her lower extremity.  COMPLICATIONS OF TREATMENT: none  FOLLOW UP COMPLIANCE: keeps appointments   PHYSICAL EXAM:  There were no vitals taken for this visit.  Frail-appearing female in NAD. Motor and sensory levels are equal and symmetric in the lower extremities proprioception is intact.  Well-developed well-nourished patient in NAD. HEENT reveals PERLA, EOMI, discs not visualized.  Oral cavity is clear. No oral mucosal lesions are identified. Neck is clear without evidence of cervical or supraclavicular adenopathy. Lungs are clear to A&P. Cardiac examination is essentially unremarkable with regular rate and rhythm without murmur rub or thrill. Abdomen is benign with no organomegaly or masses noted. Motor sensory and DTR levels are equal and symmetric in the upper and lower extremities. Cranial nerves II through XII are grossly intact. Proprioception is intact. No peripheral adenopathy or edema is identified. No motor or sensory levels are noted. Crude visual fields are within normal range.  RADIOLOGY RESULTS: CT scans PET scans MRI scans all reviewed compatible with above-stated findings  PLAN: At this time like to go ahead with radiation therapy to her vertebral body T7.  We will try to do IMRT treatment planning delivery to 5 fractions to 30 cGy.  If we cannot meet cord constraints we may have to decrease that the 30 Gray in 10 fractions.  Risks and benefits of treatment occluding low side effect profile possible some dysphagia secondary radiation esophagitis fatigue alteration blood counts skin reaction all reviewed with the patient.  She seems to comprehend my treatment plan well.  I have set her up for simulation today and will try to have her  under treatment later this week.  Patient comprehends my recommendations well.  I would like to take this opportunity to thank you for allowing me to participate in the care of your patient.SABRA Marcey Penton, MD

## 2024-03-25 ENCOUNTER — Ambulatory Visit

## 2024-03-25 DIAGNOSIS — Z510 Encounter for antineoplastic radiation therapy: Secondary | ICD-10-CM | POA: Diagnosis not present

## 2024-03-26 ENCOUNTER — Ambulatory Visit

## 2024-03-26 ENCOUNTER — Other Ambulatory Visit: Payer: Self-pay

## 2024-03-26 ENCOUNTER — Ambulatory Visit
Admission: RE | Admit: 2024-03-26 | Discharge: 2024-03-26 | Discharge status: Home / Self Care | Source: Ambulatory Visit | Attending: Radiation Oncology | Admitting: Radiation Oncology

## 2024-03-26 DIAGNOSIS — Z510 Encounter for antineoplastic radiation therapy: Secondary | ICD-10-CM | POA: Diagnosis not present

## 2024-03-26 LAB — RAD ONC ARIA SESSION SUMMARY
Course Elapsed Days: 0
Plan Fractions Treated to Date: 1
Plan Prescribed Dose Per Fraction: 3 Gy
Plan Total Fractions Prescribed: 10
Plan Total Prescribed Dose: 30 Gy
Reference Point Dosage Given to Date: 3 Gy
Reference Point Session Dosage Given: 3 Gy
Session Number: 1

## 2024-03-27 ENCOUNTER — Other Ambulatory Visit: Payer: Self-pay

## 2024-03-27 ENCOUNTER — Ambulatory Visit
Admission: RE | Admit: 2024-03-27 | Discharge: 2024-03-27 | Disposition: A | Discharge status: Home / Self Care | Source: Ambulatory Visit | Attending: Radiation Oncology | Admitting: Radiation Oncology

## 2024-03-27 ENCOUNTER — Ambulatory Visit

## 2024-03-27 DIAGNOSIS — Z923 Personal history of irradiation: Secondary | ICD-10-CM | POA: Diagnosis not present

## 2024-03-27 DIAGNOSIS — C7951 Secondary malignant neoplasm of bone: Secondary | ICD-10-CM | POA: Diagnosis not present

## 2024-03-27 DIAGNOSIS — C3432 Malignant neoplasm of lower lobe, left bronchus or lung: Secondary | ICD-10-CM | POA: Diagnosis not present

## 2024-03-27 DIAGNOSIS — Z510 Encounter for antineoplastic radiation therapy: Secondary | ICD-10-CM | POA: Diagnosis present

## 2024-03-27 LAB — RAD ONC ARIA SESSION SUMMARY
Course Elapsed Days: 1
Plan Fractions Treated to Date: 2
Plan Prescribed Dose Per Fraction: 3 Gy
Plan Total Fractions Prescribed: 10
Plan Total Prescribed Dose: 30 Gy
Reference Point Dosage Given to Date: 6 Gy
Reference Point Session Dosage Given: 3 Gy
Session Number: 2

## 2024-03-30 ENCOUNTER — Ambulatory Visit
Admission: RE | Admit: 2024-03-30 | Discharge: 2024-03-30 | Disposition: A | Discharge status: Home / Self Care | Source: Ambulatory Visit | Attending: Radiation Oncology | Admitting: Radiation Oncology

## 2024-03-30 ENCOUNTER — Inpatient Hospital Stay (HOSPITAL_BASED_OUTPATIENT_CLINIC_OR_DEPARTMENT_OTHER): Admitting: Oncology

## 2024-03-30 ENCOUNTER — Other Ambulatory Visit: Payer: Self-pay

## 2024-03-30 ENCOUNTER — Other Ambulatory Visit: Payer: Self-pay | Admitting: *Deleted

## 2024-03-30 ENCOUNTER — Encounter: Payer: Self-pay | Admitting: Oncology

## 2024-03-30 VITALS — BP 140/59 | HR 87 | Temp 98.6°F | Resp 16 | Ht 62.0 in | Wt 79.0 lb

## 2024-03-30 DIAGNOSIS — R933 Abnormal findings on diagnostic imaging of other parts of digestive tract: Secondary | ICD-10-CM | POA: Insufficient documentation

## 2024-03-30 DIAGNOSIS — R630 Anorexia: Secondary | ICD-10-CM | POA: Insufficient documentation

## 2024-03-30 DIAGNOSIS — C7951 Secondary malignant neoplasm of bone: Secondary | ICD-10-CM | POA: Insufficient documentation

## 2024-03-30 DIAGNOSIS — C3432 Malignant neoplasm of lower lobe, left bronchus or lung: Secondary | ICD-10-CM | POA: Insufficient documentation

## 2024-03-30 DIAGNOSIS — F17210 Nicotine dependence, cigarettes, uncomplicated: Secondary | ICD-10-CM | POA: Insufficient documentation

## 2024-03-30 DIAGNOSIS — G893 Neoplasm related pain (acute) (chronic): Secondary | ICD-10-CM | POA: Insufficient documentation

## 2024-03-30 DIAGNOSIS — Z510 Encounter for antineoplastic radiation therapy: Secondary | ICD-10-CM | POA: Diagnosis not present

## 2024-03-30 LAB — RAD ONC ARIA SESSION SUMMARY
Course Elapsed Days: 4
Plan Fractions Treated to Date: 3
Plan Prescribed Dose Per Fraction: 3 Gy
Plan Total Fractions Prescribed: 10
Plan Total Prescribed Dose: 30 Gy
Reference Point Dosage Given to Date: 9 Gy
Reference Point Session Dosage Given: 3 Gy
Session Number: 3

## 2024-03-30 MED ORDER — OXYCODONE HCL 5 MG PO TABS: ORAL_TABLET | ORAL | 0 refills | Status: DC

## 2024-03-30 MED ORDER — MORPHINE SULFATE ER 15 MG PO TBCR: 15.0000 mg | EXTENDED_RELEASE_TABLET | Freq: Two times a day (BID) | ORAL | 0 refills | Status: DC

## 2024-03-30 NOTE — Progress Notes (Unsigned)
 Patient is severe back pain, she rates her pain at about a 10, she says that the tremedol isn't doing anything but making her constipated. She doesn't like how loopy the pain narcotics makes her feel but right now with how much pain she is in she don't care she would take anything for the pain, it is keeping her up at night and she has no appetite.

## 2024-03-30 NOTE — Progress Notes (Unsigned)
 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  INTERVAL HISTORY: Patient returns to clinic today for further evaluation and discussion of her imaging results.  She recently developed significant back pain over the last 1 to 2 weeks and was found to have widespread metastatic lesions including cord compression.  She recently started XRT, but has only had minimal relief of her pain.  She otherwise feels well. She has no neurologic complaints.  She denies any recent fevers or illnesses.  She has a fair appetite and denies weight loss.  She has no chest pain, shortness of breath, cough, or hemoptysis.  She denies any nausea, vomiting, constipation, or diarrhea.  She has no urinary complaints.  Patient offers no further specific complaints today.  REVIEW OF SYSTEMS:   Review of Systems  Constitutional: Negative.  Negative for fever, malaise/fatigue and weight loss.  Respiratory: Negative.  Negative for cough, hemoptysis and shortness of breath.   Cardiovascular: Negative.  Negative for chest pain and leg swelling.  Gastrointestinal: Negative.  Negative for abdominal pain, blood in stool and melena.  Genitourinary: Negative.  Negative for dysuria.  Musculoskeletal:  Positive for back pain.  Skin: Negative.  Negative for rash.  Neurological: Negative.  Negative for dizziness, focal weakness, weakness and headaches.  Psychiatric/Behavioral: Negative.   The patient is not nervous/anxious.     As per HPI. Otherwise, a complete review of systems is negative.  PAST MEDICAL HISTORY: Past Medical History:  Diagnosis Date   Chicken pox    COPD (chronic obstructive pulmonary disease) (HCC)    Hyperlipidemia    Measles     PAST SURGICAL HISTORY: Past Surgical History:  Procedure Laterality Date   TUBAL LIGATION  1978    FAMILY HISTORY: Family History  Family history unknown: Yes    ADVANCED DIRECTIVES (Y/N):  N  HEALTH MAINTENANCE: Social History   Tobacco Use   Smoking status: Every Day    Current packs/day: 1.00    Average packs/day: 1 pack/day for 48.0 years (48.0 ttl pk-yrs)    Types: Cigarettes   Smokeless tobacco: Never   Tobacco comments:    1 PPD - khj 09/20/2023  Vaping Use   Vaping status: Never Used  Substance Use Topics   Alcohol use: No    Alcohol/week: 0.0 standard drinks of alcohol   Drug use: No     Colonoscopy:  PAP:  Bone density:  Lipid panel:  No Known Allergies  Current Outpatient Medications  Medication Sig Dispense Refill   albuterol  (PROVENTIL ) (2.5 MG/3ML) 0.083% nebulizer solution Take 3 mLs (2.5 mg total) by nebulization every 6 (six) hours as needed for wheezing or shortness of breath. 75 mL 5   albuterol  (VENTOLIN  HFA) 108 (90 Base) MCG/ACT inhaler Inhale 2 puffs into the lungs every 6 (six) hours as needed for wheezing or shortness of breath. 54 g 0   aspirin  EC 81 MG tablet Take 1 tablet (81 mg total) by mouth daily. Swallow whole. 30 tablet 12   azithromycin  (ZITHROMAX ) 250 MG tablet Take  1 tablet (250 mg total) by mouth daily. 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 Remove & Discard patch within 12 hours or as directed by MD 10 patch 0   pantoprazole  (PROTONIX ) 40 MG  tablet Take 1 tablet (40 mg total) by mouth 2 (two) times daily before a meal for 14 days. 28 tablet 0   rifampin  (RIFADIN ) 150 MG capsule Take 3 capsules (450 mg total) by mouth daily. 90 capsule 2   TRELEGY ELLIPTA  200-62.5-25 MCG/ACT AEPB USE 1 INHALATION DAILY 180 each 3   Amikacin  Sulfate Liposome (ARIKAYCE ) 590 MG/8.4ML SUSP Inhale 590 mg into the lungs daily. (Patient not taking: Reported on 03/30/2024)     morphine  (MS CONTIN ) 15 MG 12 hr tablet Take 1 tablet (15 mg total) by mouth every 12 (twelve) hours. 60 tablet 0   oxyCODONE  (OXY IR/ROXICODONE ) 5 MG immediate release tablet Take 1-2 tablets every 4-6 hours as needed for pain 90 tablet 0   No current facility-administered medications for this visit.    OBJECTIVE: Vitals:   03/30/24 1504  BP: (!) 140/59  Pulse: 87  Resp: 16  Temp: 98.6 F (37 C)  SpO2: 99%     Body mass index is 14.45 kg/m.    ECOG FS:0 - Asymptomatic  General: Well-developed, well-nourished, no acute distress. Eyes: Pink conjunctiva, anicteric sclera. HEENT: Normocephalic, moist mucous membranes. Lungs: No audible wheezing or coughing. Heart: Regular rate and rhythm. Abdomen: Soft, nontender, no obvious distention. Musculoskeletal: No edema, cyanosis, or clubbing. Neuro: Alert, answering all questions appropriately. Cranial nerves grossly intact. Skin: No rashes or petechiae noted. Psych: Normal affect.  LAB RESULTS:  Lab Results  Component Value Date   NA 133 (L) 03/19/2024   K 3.1 (L) 03/19/2024   CL 101 03/19/2024   CO2 24 03/19/2024   GLUCOSE 137 (H) 03/19/2024   BUN 14 03/19/2024   CREATININE 0.81 03/19/2024   CALCIUM  8.8 (L) 03/19/2024   PROT 7.0 03/06/2024   ALBUMIN 3.0 (L) 03/06/2024   AST 16 03/06/2024   ALT 11 03/06/2024   ALKPHOS 70 03/06/2024   BILITOT 0.7 03/06/2024   GFRNONAA >60 03/19/2024   GFRAA >60 09/25/2013    Lab Results  Component Value Date   WBC 6.8 03/19/2024   NEUTROABS 5.3 03/19/2024   HGB 11.6 (L)  03/19/2024   HCT 36.5 03/19/2024   MCV 96.6 03/19/2024   PLT 324 03/19/2024    STUDIES: MR THORACIC SPINE W WO CONTRAST Result Date: 03/19/2024 CLINICAL DATA:  Ataxia, nontraumatic, thoracic pathology suspected EXAM: MRI THORACIC WITHOUT AND WITH CONTRAST TECHNIQUE: Multiplanar and multiecho pulse sequences of the thoracic spine were obtained without and with intravenous contrast. CONTRAST:  4mL GADAVIST  GADOBUTROL  1 MMOL/ML IV SOLN COMPARISON:  PET-CT fusion study dated March 16, 2024. FINDINGS: Alignment:  Normal. Vertebrae: A metastatic lesion is again demonstrated within the left posterolateral aspect of the T7 vertebral body, also involving the left pedicle, lamina, transverse process and paraspinous soft tissues. The lesion is hypointense on T1 and hyperintense on T2, demonstrating avid enhancement. There is mild encroachment of the spinal canal posterolaterally on the left, resulting in mild left spinal canal stenosis. There is no spinal cord impingement. Cord:  Normal in morphology and signal intensity. Paraspinal and other soft tissues: There is  paraspinous soft tissue tumor present posteriorly on the left at the T7 vertebral body level, with mild epidural encroachment. There is a cavitary mass present posterolaterally within the right upper lung. There are also nodular past these present posteriorly within the lower lobes. Disc levels: The disc spaces are satisfactory preserved throughout the thoracic spine. There is mild central spinal canal stenosis at T7 secondary to infiltrating tumor. The spinal canal is widely patent otherwise. IMPRESSION: 1. 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 There is mild central spinal canal stenosis, but no spinal cord impingement. 2. Cavitary lesion within the right upper lung zone, compatible with primary lung cancer. Electronically Signed   By: Evalene Coho M.D.   On: 03/19/2024 15:37   NM PET Image Restag (PS) Skull Base To Thigh Addendum Date: 03/19/2024 ADDENDUM REPORT: 03/19/2024 12:28 ADDENDUM: Critical Value/emergent results were called by telephone at the time of interpretation on 03/19/2024 at 12:27 pm to provider Tinnie Dawn, who verbally acknowledged these results. Electronically Signed   By: Ranell Bring M.D.   On: 03/19/2024 12:28   Result Date: 03/19/2024 CLINICAL DATA:  Subsequent treatment strategy for lung cancer. EXAM: NUCLEAR MEDICINE PET SKULL BASE TO THIGH TECHNIQUE: 5.72 mCi F-18 FDG was injected intravenously. Full-ring PET imaging was performed from the skull base to thigh after the radiotracer. CT data was obtained and used for attenuation correction and anatomic localization. Fasting blood glucose: 100 mg/dl COMPARISON:  PET-CT 96/81/7974 FINDINGS: Mediastinal blood pool activity: SUV max 1.9 Liver activity: SUV max 2.2 NECK: The left hypermetabolic retroauricular soft tissue nodule or node is again seen. Previously maximum SUV 11.4 and dimension of of 1.4 cm. Today maximum SUV of 16.0 and maximum dimension of 17 mm on series 6, image 11. No specific other areas of abnormal uptake in the neck including along lymph node change of the submandibular, posterior triangle or internal jugular regions. Incidental CT findings: Visualized portions of the paranasal sinuses and mastoid air cells are clear except for Passy along the small left maxillary sinus. Opacity is more confluent today. Mild vascular calcifications. Submandibular glands are unremarkable. Small thyroid  gland. CHEST: Bilateral cavitary lesions are again seen. The left lower lobe focus which previously had maximum SUV value of 20.6 and dimension of 4.8 x 3.6 cm, today has decreased uptake with maximum SUV value 2.2. Lesion today on image 63 of series 6 measures 3.0 by 2.2 cm. This abuts the pleura. The previous air in the lesion is resolved. Is also a decreased left pleural  effusion. However there are new nodular areas uptake along the pleura with some thickening of these 4 such foci are identified. Example has maximum SUV of 7.6 on image 74. Dimension proximally 10 mm. Additional small focus left lung apex posteriorly along the pleura with maximum SUV of 3.8 on image 30. The right upper lobe cavitary focus on the prior had uptake along its medial wall of maximum SUV value of 5.0 and dimension of 4.2 x 4.1 cm. Today this same area has maximum SUV of 4.6 along the superomedial aspect of lesion and dimension on image 32 of series 6 proximally 4.6 by 4.2 cm. There are other areas around the lesion which shows slightly more uptake including a nodular focus anteriorly maximum SUV of 5.8 in this location today and previously 3.9. The nodules along the medial aspect this lesion extending to the hilum previously had less uptake and  today maximum SUV of 3.5 as seen on image 39. Other areas of nodularity identified such as in the superior segment of the lower lobes is similar to previous and does not show significant uptake. No specific abnormal uptake seen above blood pool in the axillary region, hilum or mediastinum. Incidental CT findings: Advanced emphysematous lung changes identified. No pneumothorax. Heart is nonenlarged. Coronary artery calcifications are seen. Trace pericardial fluid. Normal caliber slightly patulous thoracic esophagus. Biapical pleural thickening with calcifications. ABDOMEN/PELVIS: Physiologic distribution radiotracer along the parenchymal organs and renal collecting systems. The area of uptake along rectosigmoid region which previously measured 8.7 SUV maximum, today 11.5. Again worrisome for potential bowel neoplasm. Please correlate with prior or dedicated uptake when able. No other areas of abnormal bowel uptake. However there is a new hypermetabolic subcutaneous fat nodule on the right side posteriorly with maximum SUV value of 3.7 and measuring 4 mm on series 6,  image 99. A new soft tissue metastasis is possible but there is a differential. Incidental CT findings: Diffuse vascular calcifications. No bowel obstruction. Diffuse anasarca. Gallbladder is distended. Grossly the liver, spleen, adrenal glands are unremarkable. No abnormal calcifications seen within either kidney nor along the course of either ureter. Colonic diverticula. SKELETON: There is a new abnormal area of uptake of maximum SUV of 18.1 corresponding to a large soft tissue destructive mass involving the posterior elements at the T7 level. There is soft tissue extending along the central canal and neural foramen. Large area of bone destruction identified. Please correlate for the stability of this lesion. No other areas of abnormal bony uptake. Incidental CT findings: Scattered degenerative changes. Critical Value/emergent results were called by telephone at the time of interpretation on 03/19/2024 at 11:14 am to provider [facility] , who verbally acknowledged these results. IMPRESSION: Overall progression of disease. New destructive hypermetabolic bony lesion involving the posterior limb of the left side at T7 with soft tissue extending into the central canal and neural foramen. Recommend additional workup with MRI to assess for cord compression and bony stability of the spine. Developing hypermetabolic nodules along the left side of the pleura. Four lesions seen at the left lung base and 1 at the left lung apex. New subcutaneous fat right-sided lower abdominal posterior hypermetabolic nodule. This has a differential although a soft tissue metastasis is possible. Left lower lobe cavitary lesion appears smaller has less uptake consistent with an area of improved disease. The cavitary lesion in the right upper lobe is similar in size. Some of the areas of uptake along its margin are slightly increased as well as slight increase in the adjacent nodularity. Slight increase in size and uptake of a left-sided  postauricular lymph node or nodule in the neck. Persistent nodule with uptake along the rectosigmoid colon please correlate with a separate bowel lesion. Electronically Signed: By: Ranell Bring M.D. 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  Repeat PET scan from November 12, 2023 reviewed independently and report as above essentially unchanged from previous confirming patient's stage.  MRI of the brain on November 12, 2023 did not reveal any evidence of metastatic disease.  Case discussed with pulmonary and it was agreed upon that although she has high risk, giving concurrent chemotherapy may be more detrimental and patient completed XRT only.  Circulogene testing  was negative for any actionable mutations with a low tumor mutation burden and negative PD-L1.  Repeat PET scan on March 16, 2024 revealed widespread metastatic lesions in patient's bones as well as a destructive hypermetabolic lesion in T7 causing cord compression.  Patient has initiated XRT to this area.  We discussed that there are likely no treatment options at this time.  We briefly discussed hospice and comfort care, patient is not ready to make this decision.  Return to clinic in 1 week for further evaluation.     Hypermetabolic sigmoid lesion: Improved on most recent PET scan.  Previously, patient was given referral to GI for further evaluation and consideration of colonoscopy. CEA is within normal limits at 3.8.  Patient cannot undergo general anesthesia. MAC infection: Continue current antibiotics.  Follow-up and treatment per pulmonary. Parotid lesion: Unchanged from previous on recent PET scan. Pain: Patient was given a prescription for 15 mg MS Contin  every 12 hours in addition to 5 mg immediate release oxycodone .  Return to clinic in  1 week as above.  I spent a total of 30 minutes reviewing chart data, face-to-face evaluation with the patient, counseling and coordination of care as detailed above.   Patient expressed understanding and was in agreement with this plan. She also understands that She can call clinic at any time with any questions, concerns, or complaints.    Cancer Staging  Squamous cell carcinoma of lower lobe of left lung (HCC) Staging form: Lung, AJCC 8th Edition - Clinical stage from 11/08/2023: Stage IIA (cT2b, cN0, cM0) - Signed by Jacobo Evalene PARAS, MD on 11/08/2023 Stage prefix: Initial diagnosis   Evalene PARAS Jacobo, MD   04/02/2024 6:30 PM

## 2024-03-31 ENCOUNTER — Ambulatory Visit
Admission: RE | Admit: 2024-03-31 | Discharge: 2024-03-31 | Disposition: A | Discharge status: Home / Self Care | Source: Ambulatory Visit | Attending: Radiation Oncology | Admitting: Radiation Oncology

## 2024-03-31 ENCOUNTER — Other Ambulatory Visit: Payer: Self-pay

## 2024-03-31 DIAGNOSIS — Z510 Encounter for antineoplastic radiation therapy: Secondary | ICD-10-CM | POA: Diagnosis not present

## 2024-03-31 LAB — RAD ONC ARIA SESSION SUMMARY
Course Elapsed Days: 5
Plan Fractions Treated to Date: 4
Plan Prescribed Dose Per Fraction: 3 Gy
Plan Total Fractions Prescribed: 10
Plan Total Prescribed Dose: 30 Gy
Reference Point Dosage Given to Date: 12 Gy
Reference Point Session Dosage Given: 3 Gy
Session Number: 4

## 2024-04-01 ENCOUNTER — Other Ambulatory Visit: Payer: Self-pay

## 2024-04-01 ENCOUNTER — Ambulatory Visit
Admission: RE | Admit: 2024-04-01 | Discharge: 2024-04-01 | Disposition: A | Discharge status: Home / Self Care | Source: Ambulatory Visit | Attending: Radiation Oncology | Admitting: Radiation Oncology

## 2024-04-01 DIAGNOSIS — Z510 Encounter for antineoplastic radiation therapy: Secondary | ICD-10-CM | POA: Diagnosis not present

## 2024-04-01 LAB — RAD ONC ARIA SESSION SUMMARY
Course Elapsed Days: 6
Plan Fractions Treated to Date: 5
Plan Prescribed Dose Per Fraction: 3 Gy
Plan Total Fractions Prescribed: 10
Plan Total Prescribed Dose: 30 Gy
Reference Point Dosage Given to Date: 15 Gy
Reference Point Session Dosage Given: 3 Gy
Session Number: 5

## 2024-04-02 ENCOUNTER — Other Ambulatory Visit: Payer: Self-pay

## 2024-04-02 ENCOUNTER — Ambulatory Visit
Admission: RE | Admit: 2024-04-02 | Discharge: 2024-04-02 | Disposition: A | Discharge status: Home / Self Care | Source: Ambulatory Visit | Attending: Radiation Oncology | Admitting: Radiation Oncology

## 2024-04-02 ENCOUNTER — Ambulatory Visit

## 2024-04-02 DIAGNOSIS — Z510 Encounter for antineoplastic radiation therapy: Secondary | ICD-10-CM | POA: Diagnosis not present

## 2024-04-02 LAB — RAD ONC ARIA SESSION SUMMARY
Course Elapsed Days: 7
Plan Fractions Treated to Date: 6
Plan Prescribed Dose Per Fraction: 3 Gy
Plan Total Fractions Prescribed: 10
Plan Total Prescribed Dose: 30 Gy
Reference Point Dosage Given to Date: 18 Gy
Reference Point Session Dosage Given: 3 Gy
Session Number: 6

## 2024-04-03 ENCOUNTER — Ambulatory Visit

## 2024-04-03 ENCOUNTER — Ambulatory Visit
Admission: RE | Admit: 2024-04-03 | Discharge: 2024-04-03 | Disposition: A | Discharge status: Home / Self Care | Source: Ambulatory Visit | Attending: Radiation Oncology | Admitting: Radiation Oncology

## 2024-04-03 ENCOUNTER — Other Ambulatory Visit: Payer: Self-pay

## 2024-04-03 DIAGNOSIS — Z510 Encounter for antineoplastic radiation therapy: Secondary | ICD-10-CM | POA: Diagnosis not present

## 2024-04-03 LAB — RAD ONC ARIA SESSION SUMMARY
Course Elapsed Days: 8
Plan Fractions Treated to Date: 7
Plan Prescribed Dose Per Fraction: 3 Gy
Plan Total Fractions Prescribed: 10
Plan Total Prescribed Dose: 30 Gy
Reference Point Dosage Given to Date: 21 Gy
Reference Point Session Dosage Given: 3 Gy
Session Number: 7

## 2024-04-06 ENCOUNTER — Inpatient Hospital Stay (HOSPITAL_BASED_OUTPATIENT_CLINIC_OR_DEPARTMENT_OTHER): Admitting: Hospice and Palliative Medicine

## 2024-04-06 ENCOUNTER — Ambulatory Visit
Admission: RE | Admit: 2024-04-06 | Discharge: 2024-04-06 | Disposition: A | Discharge status: Home / Self Care | Source: Ambulatory Visit | Attending: Radiation Oncology | Admitting: Radiation Oncology

## 2024-04-06 ENCOUNTER — Inpatient Hospital Stay (HOSPITAL_BASED_OUTPATIENT_CLINIC_OR_DEPARTMENT_OTHER): Admitting: Oncology

## 2024-04-06 ENCOUNTER — Encounter: Payer: Self-pay | Admitting: Oncology

## 2024-04-06 ENCOUNTER — Other Ambulatory Visit: Payer: Self-pay

## 2024-04-06 VITALS — BP 128/43 | HR 86 | Temp 97.7°F | Resp 16 | Wt 77.7 lb

## 2024-04-06 DIAGNOSIS — G893 Neoplasm related pain (acute) (chronic): Secondary | ICD-10-CM

## 2024-04-06 DIAGNOSIS — C3432 Malignant neoplasm of lower lobe, left bronchus or lung: Secondary | ICD-10-CM

## 2024-04-06 DIAGNOSIS — Z515 Encounter for palliative care: Secondary | ICD-10-CM | POA: Diagnosis not present

## 2024-04-06 DIAGNOSIS — Z510 Encounter for antineoplastic radiation therapy: Secondary | ICD-10-CM | POA: Diagnosis not present

## 2024-04-06 LAB — RAD ONC ARIA SESSION SUMMARY
Course Elapsed Days: 11
Plan Fractions Treated to Date: 8
Plan Prescribed Dose Per Fraction: 3 Gy
Plan Total Fractions Prescribed: 10
Plan Total Prescribed Dose: 30 Gy
Reference Point Dosage Given to Date: 24 Gy
Reference Point Session Dosage Given: 3 Gy
Session Number: 8

## 2024-04-06 MED ORDER — MORPHINE SULFATE ER 15 MG PO TBCR: 15.0000 mg | EXTENDED_RELEASE_TABLET | Freq: Three times a day (TID) | ORAL | 0 refills | Status: DC

## 2024-04-06 MED ORDER — NALOXONE HCL 4 MG/0.1ML NA LIQD: NASAL | 0 refills | Status: DC

## 2024-04-06 MED ORDER — OXYCODONE HCL 5 MG PO TABS: 5.0000 mg | ORAL_TABLET | ORAL | 0 refills | Status: DC | PRN

## 2024-04-06 MED ORDER — DEXAMETHASONE 4 MG PO TABS: 4.0000 mg | ORAL_TABLET | Freq: Every day | ORAL | 0 refills | Status: DC

## 2024-04-06 NOTE — Progress Notes (Signed)
 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  INTERVAL HISTORY: Patient returns to clinic today for further evaluation and continued evaluation and treatment of her pain.  Despite initiating narcotic last week, patient continues to have significant pain although she admits it is slightly improved.  She continues with daily XRT completing treatment later this week.  She has a poor appetite and continued weight loss.  She has no neurologic complaints.  She denies any recent fevers or illnesses.  She has no chest pain, shortness of breath, cough, or hemoptysis.  She denies any nausea, vomiting, constipation, or diarrhea.  She has no urinary complaints.  Patient offers no further specific complaints today.  REVIEW OF SYSTEMS:   Review of Systems  Constitutional:  Positive for malaise/fatigue and weight loss. Negative for fever.  Respiratory: Negative.  Negative for cough, hemoptysis and shortness of breath.   Cardiovascular: Negative.  Negative for chest pain and leg swelling.  Gastrointestinal: Negative.  Negative for abdominal pain, blood in stool and melena.  Genitourinary: Negative.  Negative for dysuria.  Musculoskeletal:  Positive for back pain.  Skin: Negative.  Negative for rash.  Neurological:  Positive for weakness. Negative for dizziness, focal weakness and headaches.  Psychiatric/Behavioral: Negative.  The patient is not  nervous/anxious.     As per HPI. Otherwise, a complete review of systems is negative.  PAST MEDICAL HISTORY: Past Medical History:  Diagnosis Date   Chicken pox    COPD (chronic obstructive pulmonary disease) (HCC)    Hyperlipidemia    Measles     PAST SURGICAL HISTORY: Past Surgical History:  Procedure Laterality Date   TUBAL LIGATION  1978    FAMILY HISTORY: Family History  Family history unknown: Yes    ADVANCED DIRECTIVES (Y/N):  N  HEALTH MAINTENANCE: Social History   Tobacco Use   Smoking status: Every Day    Current packs/day: 1.00    Average packs/day: 1 pack/day for 48.0 years (48.0 ttl pk-yrs)    Types: Cigarettes   Smokeless tobacco: Never   Tobacco comments:    1 PPD - khj 09/20/2023  Vaping Use   Vaping status: Never Used  Substance Use Topics   Alcohol use: No    Alcohol/week: 0.0 standard drinks of alcohol   Drug use: No     Colonoscopy:  PAP:  Bone density:  Lipid panel:  No Known Allergies  Current Outpatient Medications  Medication Sig Dispense Refill   albuterol  (PROVENTIL ) (2.5 MG/3ML) 0.083% nebulizer solution Take 3 mLs (2.5 mg total) by nebulization every 6 (six) hours as needed for wheezing or shortness of breath. 75 mL 5   albuterol  (VENTOLIN  HFA) 108 (90 Base) MCG/ACT inhaler Inhale 2 puffs into the lungs every 6 (six) hours as needed for wheezing or shortness of breath. 54 g 0   aspirin  EC 81 MG tablet Take 1 tablet (81 mg total) by mouth daily. Swallow whole. 30 tablet 12   azithromycin  (ZITHROMAX ) 250 MG tablet Take 1 tablet (250 mg total) by mouth daily. 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 Remove & Discard patch within 12 hours or as directed by MD 10 patch 0   pantoprazole  (PROTONIX ) 40 MG tablet Take 1 tablet  (40 mg total) by mouth 2 (two) times daily before a meal for 14 days. 28 tablet 0   rifampin  (RIFADIN ) 150 MG capsule Take 3 capsules (450 mg total) by mouth daily. 90 capsule 2   TRELEGY ELLIPTA  200-62.5-25 MCG/ACT AEPB USE 1 INHALATION DAILY 180 each 3   Amikacin  Sulfate Liposome (ARIKAYCE ) 590 MG/8.4ML SUSP Inhale 590 mg into the lungs daily. (Patient not taking: Reported on 04/06/2024)     dexamethasone  (DECADRON ) 4 MG tablet Take 1 tablet (4 mg total) by mouth daily. 30 tablet 0   morphine  (MS CONTIN ) 15 MG 12 hr tablet Take 1 tablet (15 mg total) by mouth every 8 (eight) hours. 60 tablet 0   naloxone  (NARCAN ) nasal spray 4 mg/0.1 mL SPRAY 1 SPRAY INTO ONE NOSTRIL AS DIRECTED FOR OPIOID OVERDOSE (TURN PERSON ON SIDE AFTER DOSE. IF NO RESPONSE IN 2-3 MINUTES OR PERSON RESPONDS BUT RELAPSES, REPEAT USING A NEW SPRAY DEVICE AND SPRAY INTO THE OTHER NOSTRIL. CALL 911 AFTER USE.) * EMERGENCY USE ONLY * 1 each 0   oxyCODONE  (OXY IR/ROXICODONE ) 5 MG immediate release tablet Take 1-2 tablets (5-10 mg total) by mouth every 4 (four) hours as needed for severe pain (pain score 7-10). Take 1-2 tablets every 4-6 hours as needed for pain 90 tablet 0   No current facility-administered medications for this visit.    OBJECTIVE: Vitals:   04/06/24 1433  BP: (!) 128/43  Pulse: 86  Resp: 16  Temp: 97.7 F (36.5 C)  SpO2: 99%     Body mass index is 14.21 kg/m.    ECOG FS:1 - Symptomatic but completely ambulatory  General: Thin, no acute distress. Eyes: Pink conjunctiva, anicteric sclera. HEENT: Normocephalic, moist mucous membranes. Lungs: No audible wheezing or coughing. Heart: Regular rate and rhythm. Abdomen: Soft, nontender, no obvious distention. Musculoskeletal: No edema, cyanosis, or clubbing. Neuro: Alert, answering all questions appropriately. Cranial nerves grossly intact. Skin: No rashes or petechiae noted. Psych: Normal affect.  LAB RESULTS:  Lab Results  Component Value Date   NA  133 (L) 03/19/2024   K 3.1 (L) 03/19/2024   CL 101 03/19/2024   CO2 24 03/19/2024   GLUCOSE 137 (H) 03/19/2024   BUN 14 03/19/2024   CREATININE 0.81 03/19/2024   CALCIUM  8.8 (L) 03/19/2024   PROT 7.0 03/06/2024   ALBUMIN 3.0 (L) 03/06/2024   AST 16 03/06/2024   ALT 11 03/06/2024   ALKPHOS 70 03/06/2024   BILITOT 0.7 03/06/2024   GFRNONAA >60 03/19/2024   GFRAA >60 09/25/2013    Lab Results  Component Value Date   WBC 6.8 03/19/2024   NEUTROABS 5.3 03/19/2024   HGB 11.6 (L) 03/19/2024   HCT 36.5 03/19/2024   MCV 96.6 03/19/2024   PLT 324 03/19/2024    STUDIES: MR THORACIC SPINE W WO CONTRAST Result Date: 03/19/2024 CLINICAL DATA:  Ataxia, nontraumatic, thoracic pathology suspected EXAM: MRI THORACIC WITHOUT AND WITH CONTRAST TECHNIQUE: Multiplanar and multiecho pulse sequences of the thoracic spine were obtained without and with intravenous contrast. CONTRAST:  4mL GADAVIST  GADOBUTROL  1 MMOL/ML IV SOLN COMPARISON:  PET-CT fusion study dated March 16, 2024. FINDINGS: Alignment:  Normal. Vertebrae: A metastatic lesion is again demonstrated within the left posterolateral aspect of the T7 vertebral body, also involving the left pedicle, lamina, transverse process and paraspinous soft tissues. The lesion is hypointense on T1 and hyperintense on T2, demonstrating avid enhancement. There is mild encroachment of the spinal canal posterolaterally on the left, resulting in mild left spinal canal stenosis. There is no spinal cord impingement. Cord:  Normal in morphology and signal intensity. Paraspinal and other soft tissues: There is paraspinous soft tissue tumor present posteriorly on the left at the T7 vertebral body level, with mild epidural encroachment. There is a cavitary mass present posterolaterally within the right upper lung. There are also nodular past these present posteriorly within the lower lobes. Disc levels: The disc spaces are satisfactory preserved throughout the thoracic spine.  There is mild central spinal canal stenosis at T7 secondary to infiltrating tumor. The spinal canal is widely patent otherwise. IMPRESSION: 1. 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 There is mild central spinal canal stenosis, but no spinal cord impingement. 2. Cavitary lesion within the right upper lung zone, compatible with primary lung cancer. Electronically Signed   By: Evalene Coho M.D.   On: 03/19/2024 15:37   NM PET Image Restag (PS) Skull Base To Thigh Addendum Date: 03/19/2024 ADDENDUM REPORT: 03/19/2024 12:28 ADDENDUM: Critical Value/emergent results were called by telephone at the time of interpretation on 03/19/2024 at 12:27 pm to provider Tinnie Dawn, who verbally acknowledged these results. Electronically Signed   By: Ranell Bring M.D.   On: 03/19/2024 12:28   Result Date: 03/19/2024 CLINICAL DATA:  Subsequent treatment strategy for lung cancer. EXAM: NUCLEAR MEDICINE PET SKULL BASE TO THIGH TECHNIQUE: 5.72 mCi F-18 FDG was injected intravenously. Full-ring PET imaging was performed from the skull base to thigh after the radiotracer. CT data was obtained and used for attenuation correction and anatomic localization. Fasting blood glucose: 100 mg/dl COMPARISON:  PET-CT 96/81/7974 FINDINGS: Mediastinal blood pool activity: SUV max 1.9 Liver activity: SUV max 2.2 NECK: The left hypermetabolic retroauricular soft tissue nodule or node is again seen. Previously maximum SUV 11.4 and dimension of of 1.4 cm. Today maximum SUV of 16.0 and maximum dimension of 17 mm on series 6, image 11. No specific other areas of abnormal uptake in the neck including along lymph node change of the submandibular, posterior triangle or internal jugular regions. Incidental CT findings: Visualized portions of the paranasal sinuses and mastoid air cells are clear except for Passy along the small left maxillary  sinus. Opacity is more confluent today. Mild vascular calcifications. Submandibular glands are unremarkable. Small thyroid  gland. CHEST: Bilateral cavitary lesions are again seen. The left lower lobe focus which previously had maximum SUV value of 20.6 and dimension of 4.8 x 3.6 cm, today has decreased uptake with maximum SUV value 2.2. Lesion today on image 63 of series 6 measures 3.0 by 2.2 cm. This abuts the pleura. The previous air in the lesion is resolved. Is also a decreased left pleural effusion. However there are new nodular areas uptake along the pleura with some thickening of these 4 such foci are identified. Example has maximum SUV of 7.6 on image 74. Dimension proximally 10 mm. Additional small focus left lung apex posteriorly along the pleura with maximum SUV of 3.8 on image 30. The right upper lobe cavitary focus on the  prior had uptake along its medial wall of maximum SUV value of 5.0 and dimension of 4.2 x 4.1 cm. Today this same area has maximum SUV of 4.6 along the superomedial aspect of lesion and dimension on image 32 of series 6 proximally 4.6 by 4.2 cm. There are other areas around the lesion which shows slightly more uptake including a nodular focus anteriorly maximum SUV of 5.8 in this location today and previously 3.9. The nodules along the medial aspect this lesion extending to the hilum previously had less uptake and today maximum SUV of 3.5 as seen on image 39. Other areas of nodularity identified such as in the superior segment of the lower lobes is similar to previous and does not show significant uptake. No specific abnormal uptake seen above blood pool in the axillary region, hilum or mediastinum. Incidental CT findings: Advanced emphysematous lung changes identified. No pneumothorax. Heart is nonenlarged. Coronary artery calcifications are seen. Trace pericardial fluid. Normal caliber slightly patulous thoracic esophagus. Biapical pleural thickening with calcifications.  ABDOMEN/PELVIS: Physiologic distribution radiotracer along the parenchymal organs and renal collecting systems. The area of uptake along rectosigmoid region which previously measured 8.7 SUV maximum, today 11.5. Again worrisome for potential bowel neoplasm. Please correlate with prior or dedicated uptake when able. No other areas of abnormal bowel uptake. However there is a new hypermetabolic subcutaneous fat nodule on the right side posteriorly with maximum SUV value of 3.7 and measuring 4 mm on series 6, image 99. A new soft tissue metastasis is possible but there is a differential. Incidental CT findings: Diffuse vascular calcifications. No bowel obstruction. Diffuse anasarca. Gallbladder is distended. Grossly the liver, spleen, adrenal glands are unremarkable. No abnormal calcifications seen within either kidney nor along the course of either ureter. Colonic diverticula. SKELETON: There is a new abnormal area of uptake of maximum SUV of 18.1 corresponding to a large soft tissue destructive mass involving the posterior elements at the T7 level. There is soft tissue extending along the central canal and neural foramen. Large area of bone destruction identified. Please correlate for the stability of this lesion. No other areas of abnormal bony uptake. Incidental CT findings: Scattered degenerative changes. Critical Value/emergent results were called by telephone at the time of interpretation on 03/19/2024 at 11:14 am to provider [facility] , who verbally acknowledged these results. IMPRESSION: Overall progression of disease. New destructive hypermetabolic bony lesion involving the posterior limb of the left side at T7 with soft tissue extending into the central canal and neural foramen. Recommend additional workup with MRI to assess for cord compression and bony stability of the spine. Developing hypermetabolic nodules along the left side of the pleura. Four lesions seen at the left lung base and 1 at the left  lung apex. New subcutaneous fat right-sided lower abdominal posterior hypermetabolic nodule. This has a differential although a soft tissue metastasis is possible. Left lower lobe cavitary lesion appears smaller has less uptake consistent with an area of improved disease. The cavitary lesion in the right upper lobe is similar in size. Some of the areas of uptake along its margin are slightly increased as well as slight increase in the adjacent nodularity. Slight increase in size and uptake of a left-sided postauricular lymph node or nodule in the neck. Persistent nodule with uptake along the rectosigmoid colon please correlate with a separate bowel lesion. Electronically Signed: By: Ranell Bring M.D. 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  Repeat PET scan from November 12, 2023 reviewed independently and report as above essentially unchanged from previous confirming patient's stage.  MRI of the brain on November 12, 2023 did not reveal any evidence of metastatic disease.  Case discussed with pulmonary and it was agreed upon that although she has high risk, giving concurrent chemotherapy may be more detrimental and patient completed XRT only.  Circulogene testing was negative for any actionable mutations with a low tumor mutation burden and negative PD-L1.  Repeat PET scan on March 16, 2024 revealed widespread metastatic lesions in patient's bones as well as a destructive hypermetabolic lesion in T7 causing cord compression.  Patient has initiated XRT to this area and will complete later this week.  We once again discussed that patient does not have significant treatment options, but she has not quite ready for hospice and comfort care.  No intervention is needed.  Return to clinic in 1 week for continued  evaluation.  Appreciate palliative care input.   Hypermetabolic sigmoid lesion: Improved on most recent PET scan.  Previously, patient was given referral to GI for further evaluation and consideration of colonoscopy. CEA is within normal limits at 3.8.  Patient cannot undergo general anesthesia. MAC infection: Continue current antibiotics.  Follow-up and treatment per pulmonary. Parotid lesion: Unchanged from previous on recent PET scan. Pain: Increase MS Contin  to 15 mg every 8 hours.  Continue 5 to 10 mg immediate release oxycodone  every 3-4 hours.  Patient was also given a prescription for 4 mg daily of dexamethasone .  Appreciate palliative care input.   Poor appetite/weight loss: Dexamethasone  as above.  I spent a total of 30 minutes reviewing chart data, face-to-face evaluation with the patient, counseling and coordination of care as detailed above.\   Patient expressed understanding and was in agreement with this plan. She also understands that She can call clinic at any time with any questions, concerns, or complaints.    Cancer Staging  Squamous cell carcinoma of lower lobe of left lung (HCC) Staging form: Lung, AJCC 8th Edition - Clinical stage from 11/08/2023: Stage IIA (cT2b, cN0, cM0) - Signed by Jacobo Evalene PARAS, MD on 11/08/2023 Stage prefix: Initial diagnosis   Evalene PARAS Jacobo, MD   04/07/2024 8:16 AM

## 2024-04-06 NOTE — Progress Notes (Signed)
 Palliative Medicine [facility] at [facility] [phone] [phone]   Name: Alejandra Ortiz Date: 04/06/2024 MRN: [id number]  DOB: 10/15/1950  Patient Care Team: Antonette Angeline ORN, NP as PCP - General (Internal Medicine) Bluford Jacqulyn MATSU, DO as Consulting Physician (Family Medicine) [place], [facility] (Optometry) Verdene Gills, RN as Oncology Nurse Navigator Lenn Aran, MD as Consulting Physician (Radiation Oncology) Jacobo Evalene PARAS, MD as Consulting Physician (Oncology)    REASON FOR CONSULTATION: Alejandra Ortiz is a 73 y.o. 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  Patient found to have a large destructive hypermetabolic lesion at T7 causing cord compression.  She is on XRT.  Patient is not felt to have any viable systemic treatment options and hospice has been recommended.  Palliative care was consulted to address goals and manage ongoing symptoms.  SOCIAL HISTORY:     reports that she has been smoking cigarettes. She has a 48 pack-year smoking history. She has never used smokeless tobacco. She reports that she does not drink alcohol and does not use drugs.  Patient is married lives at home with her husband.  She has adult children who are involved in her care.  ADVANCE DIRECTIVES:    CODE STATUS:   PAST MEDICAL HISTORY: Past Medical History:  Diagnosis Date   Chicken pox    COPD (chronic obstructive pulmonary disease) (HCC)    Hyperlipidemia    Measles     PAST SURGICAL HISTORY:  Past Surgical History:  Procedure Laterality Date   TUBAL LIGATION  1978    HEMATOLOGY/ONCOLOGY HISTORY:  Oncology History  Squamous cell carcinoma of lower lobe of left lung (HCC)  01/17/2023 Initial Diagnosis   Squamous cell carcinoma of lower lobe of left lung (HCC)   11/08/2023 Cancer Staging   Staging  form: Lung, AJCC 8th Edition - Clinical stage from 11/08/2023: Stage IIA (cT2b, cN0, cM0) - Signed by Jacobo Evalene PARAS, MD on 11/08/2023 Stage prefix: Initial diagnosis     ALLERGIES:  has no known allergies.  MEDICATIONS:  Current Outpatient Medications  Medication Sig Dispense Refill   dexamethasone  (DECADRON ) 4 MG tablet Take 1 tablet (4 mg total) by mouth daily. 30 tablet 0   naloxone  (NARCAN ) nasal spray 4 mg/0.1 mL SPRAY 1 SPRAY INTO ONE NOSTRIL AS DIRECTED FOR OPIOID OVERDOSE (TURN PERSON ON SIDE AFTER DOSE. IF NO RESPONSE IN 2-3 MINUTES OR PERSON RESPONDS BUT RELAPSES, REPEAT USING A NEW SPRAY DEVICE AND SPRAY INTO THE OTHER NOSTRIL. CALL 911 AFTER USE.) * EMERGENCY USE ONLY * 1 each 0   albuterol  (PROVENTIL ) (2.5 MG/3ML) 0.083% nebulizer solution Take 3 mLs (2.5 mg total) by nebulization every 6 (six) hours as needed for wheezing or shortness of breath. 75 mL 5   albuterol  (VENTOLIN  HFA) 108 (90 Base) MCG/ACT inhaler Inhale 2 puffs into the lungs every 6 (six) hours as needed for wheezing or shortness of breath. 54 g 0   Amikacin  Sulfate Liposome (ARIKAYCE ) 590 MG/8.4ML SUSP Inhale 590 mg into the lungs daily. (Patient not taking: Reported on 04/06/2024)     aspirin  EC 81 MG tablet Take 1 tablet (81 mg total) by mouth daily. Swallow whole. 30 tablet 12   azithromycin  (ZITHROMAX ) 250 MG tablet Take 1 tablet (250 mg total) by mouth daily. For mycobacterium avium treatmentFor mycobacterium avium treatment 30 each 2   ethambutol  (MYAMBUTOL ) 400 MG  tablet TAKE 1 AND 1/2 TABLETS(600 MG) BY MOUTH DAILY 45 tablet 2   lidocaine  (LIDODERM ) 5 % Place 1 patch onto the skin every 12 (twelve) hours. Remove & Discard patch within 12 hours or as directed by MD 10 patch 0   morphine  (MS CONTIN ) 15 MG 12 hr tablet Take 1 tablet (15 mg total) by mouth every 8 (eight) hours. 60 tablet 0   oxyCODONE  (OXY IR/ROXICODONE ) 5 MG immediate release tablet Take 1-2 tablets (5-10 mg total) by mouth every 4 (four)  hours as needed for severe pain (pain score 7-10). Take 1-2 tablets every 4-6 hours as needed for pain 90 tablet 0   pantoprazole  (PROTONIX ) 40 MG tablet Take 1 tablet (40 mg total) by mouth 2 (two) times daily before a meal for 14 days. 28 tablet 0   rifampin  (RIFADIN ) 150 MG capsule Take 3 capsules (450 mg total) by mouth daily. 90 capsule 2   TRELEGY ELLIPTA  200-62.5-25 MCG/ACT AEPB USE 1 INHALATION DAILY 180 each 3   No current facility-administered medications for this visit.    VITAL SIGNS: There were no vitals taken for this visit. There were no vitals filed for this visit.  Estimated body mass index is 14.21 kg/m as calculated from the following:   Height as of 03/30/24: 5' 2 (1.575 m).   Weight as of an earlier encounter on 04/06/24: 77 lb 11.2 oz (35.2 kg).  LABS: CBC:    Component Value Date/Time   WBC 6.8 03/19/2024 1407   HGB 11.6 (L) 03/19/2024 1407   HGB 12.3 09/25/2013 0950   HCT 36.5 03/19/2024 1407   HCT 35.4 09/25/2013 0950   PLT 324 03/19/2024 1407   PLT 183 09/25/2013 0950   MCV 96.6 03/19/2024 1407   MCV 98 09/25/2013 0950   NEUTROABS 5.3 03/19/2024 1407   LYMPHSABS 0.7 03/19/2024 1407   MONOABS 0.6 03/19/2024 1407   EOSABS 0.2 03/19/2024 1407   BASOSABS 0.0 03/19/2024 1407   Comprehensive Metabolic Panel:    Component Value Date/Time   NA 133 (L) 03/19/2024 1407   NA 137 09/25/2013 0950   K 3.1 (L) 03/19/2024 1407   K 3.6 09/25/2013 0950   CL 101 03/19/2024 1407   CL 106 09/25/2013 0950   CO2 24 03/19/2024 1407   CO2 27 09/25/2013 0950   BUN 14 03/19/2024 1407   BUN 14 09/25/2013 0950   CREATININE 0.81 03/19/2024 1407   CREATININE 0.65 12/02/2023 1341   GLUCOSE 137 (H) 03/19/2024 1407   GLUCOSE 103 (H) 09/25/2013 0950   CALCIUM  8.8 (L) 03/19/2024 1407   CALCIUM  8.9 09/25/2013 0950   AST 16 03/06/2024 1233   AST 11 (L) 11/07/2023 1556   ALT 11 03/06/2024 1233   ALT 10 11/07/2023 1556   ALKPHOS 70 03/06/2024 1233   BILITOT 0.7 03/06/2024  1233   BILITOT 0.4 11/07/2023 1556   PROT 7.0 03/06/2024 1233   ALBUMIN 3.0 (L) 03/06/2024 1233    RADIOGRAPHIC STUDIES: MR THORACIC SPINE W WO CONTRAST Result Date: 03/19/2024 CLINICAL DATA:  Ataxia, nontraumatic, thoracic pathology suspected EXAM: MRI THORACIC WITHOUT AND WITH CONTRAST TECHNIQUE: Multiplanar and multiecho pulse sequences of the thoracic spine were obtained without and with intravenous contrast. CONTRAST:  4mL GADAVIST  GADOBUTROL  1 MMOL/ML IV SOLN COMPARISON:  PET-CT fusion study dated March 16, 2024. FINDINGS: Alignment:  Normal. Vertebrae: A metastatic lesion is again demonstrated within the left posterolateral aspect of the T7 vertebral body, also involving the left pedicle, lamina, transverse process and  paraspinous soft tissues. The lesion is hypointense on T1 and hyperintense on T2, demonstrating avid enhancement. There is mild encroachment of the spinal canal posterolaterally on the left, resulting in mild left spinal canal stenosis. There is no spinal cord impingement. Cord:  Normal in morphology and signal intensity. Paraspinal and other soft tissues: There is paraspinous soft tissue tumor present posteriorly on the left at the T7 vertebral body level, with mild epidural encroachment. There is a cavitary mass present posterolaterally within the right upper lung. There are also nodular past these present posteriorly within the lower lobes. Disc levels: The disc spaces are satisfactory preserved throughout the thoracic spine. There is mild central spinal canal stenosis at T7 secondary to infiltrating tumor. The spinal canal is widely patent otherwise. IMPRESSION: 1. 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 There is mild central spinal canal stenosis, but no spinal cord impingement. 2. Cavitary lesion within the right upper lung zone, compatible with primary lung cancer.  Electronically Signed   By: Evalene Coho M.D.   On: 03/19/2024 15:37   NM PET Image Restag (PS) Skull Base To Thigh Addendum Date: 03/19/2024 ADDENDUM REPORT: 03/19/2024 12:28 ADDENDUM: Critical Value/emergent results were called by telephone at the time of interpretation on 03/19/2024 at 12:27 pm to provider Tinnie Dawn, who verbally acknowledged these results. Electronically Signed   By: Ranell Bring M.D.   On: 03/19/2024 12:28   Result Date: 03/19/2024 CLINICAL DATA:  Subsequent treatment strategy for lung cancer. EXAM: NUCLEAR MEDICINE PET SKULL BASE TO THIGH TECHNIQUE: 5.72 mCi F-18 FDG was injected intravenously. Full-ring PET imaging was performed from the skull base to thigh after the radiotracer. CT data was obtained and used for attenuation correction and anatomic localization. Fasting blood glucose: 100 mg/dl COMPARISON:  PET-CT 96/81/7974 FINDINGS: Mediastinal blood pool activity: SUV max 1.9 Liver activity: SUV max 2.2 NECK: The left hypermetabolic retroauricular soft tissue nodule or node is again seen. Previously maximum SUV 11.4 and dimension of of 1.4 cm. Today maximum SUV of 16.0 and maximum dimension of 17 mm on series 6, image 11. No specific other areas of abnormal uptake in the neck including along lymph node change of the submandibular, posterior triangle or internal jugular regions. Incidental CT findings: Visualized portions of the paranasal sinuses and mastoid air cells are clear except for Passy along the small left maxillary sinus. Opacity is more confluent today. Mild vascular calcifications. Submandibular glands are unremarkable. Small thyroid  gland. CHEST: Bilateral cavitary lesions are again seen. The left lower lobe focus which previously had maximum SUV value of 20.6 and dimension of 4.8 x 3.6 cm, today has decreased uptake with maximum SUV value 2.2. Lesion today on image 63 of series 6 measures 3.0 by 2.2 cm. This abuts the pleura. The previous air in the lesion is  resolved. Is also a decreased left pleural effusion. However there are new nodular areas uptake along the pleura with some thickening of these 4 such foci are identified. Example has maximum SUV of 7.6 on image 74. Dimension proximally 10 mm. Additional small focus left lung apex posteriorly along the pleura with maximum SUV of 3.8 on image 30. The right upper lobe cavitary focus on the prior had uptake along its medial wall of maximum SUV value of 5.0 and dimension of 4.2 x 4.1 cm. Today this same area has maximum SUV of 4.6 along the superomedial aspect of lesion and dimension  on image 32 of series 6 proximally 4.6 by 4.2 cm. There are other areas around the lesion which shows slightly more uptake including a nodular focus anteriorly maximum SUV of 5.8 in this location today and previously 3.9. The nodules along the medial aspect this lesion extending to the hilum previously had less uptake and today maximum SUV of 3.5 as seen on image 39. Other areas of nodularity identified such as in the superior segment of the lower lobes is similar to previous and does not show significant uptake. No specific abnormal uptake seen above blood pool in the axillary region, hilum or mediastinum. Incidental CT findings: Advanced emphysematous lung changes identified. No pneumothorax. Heart is nonenlarged. Coronary artery calcifications are seen. Trace pericardial fluid. Normal caliber slightly patulous thoracic esophagus. Biapical pleural thickening with calcifications. ABDOMEN/PELVIS: Physiologic distribution radiotracer along the parenchymal organs and renal collecting systems. The area of uptake along rectosigmoid region which previously measured 8.7 SUV maximum, today 11.5. Again worrisome for potential bowel neoplasm. Please correlate with prior or dedicated uptake when able. No other areas of abnormal bowel uptake. However there is a new hypermetabolic subcutaneous fat nodule on the right side posteriorly with maximum SUV  value of 3.7 and measuring 4 mm on series 6, image 99. A new soft tissue metastasis is possible but there is a differential. Incidental CT findings: Diffuse vascular calcifications. No bowel obstruction. Diffuse anasarca. Gallbladder is distended. Grossly the liver, spleen, adrenal glands are unremarkable. No abnormal calcifications seen within either kidney nor along the course of either ureter. Colonic diverticula. SKELETON: There is a new abnormal area of uptake of maximum SUV of 18.1 corresponding to a large soft tissue destructive mass involving the posterior elements at the T7 level. There is soft tissue extending along the central canal and neural foramen. Large area of bone destruction identified. Please correlate for the stability of this lesion. No other areas of abnormal bony uptake. Incidental CT findings: Scattered degenerative changes. Critical Value/emergent results were called by telephone at the time of interpretation on 03/19/2024 at 11:14 am to provider [facility] , who verbally acknowledged these results. IMPRESSION: Overall progression of disease. New destructive hypermetabolic bony lesion involving the posterior limb of the left side at T7 with soft tissue extending into the central canal and neural foramen. Recommend additional workup with MRI to assess for cord compression and bony stability of the spine. Developing hypermetabolic nodules along the left side of the pleura. Four lesions seen at the left lung base and 1 at the left lung apex. New subcutaneous fat right-sided lower abdominal posterior hypermetabolic nodule. This has a differential although a soft tissue metastasis is possible. Left lower lobe cavitary lesion appears smaller has less uptake consistent with an area of improved disease. The cavitary lesion in the right upper lobe is similar in size. Some of the areas of uptake along its margin are slightly increased as well as slight increase in the adjacent nodularity. Slight  increase in size and uptake of a left-sided postauricular lymph node or nodule in the neck. Persistent nodule with uptake along the rectosigmoid colon please correlate with a separate bowel lesion. Electronically Signed: By: Ranell Bring M.D. On: 03/19/2024 11:16    PERFORMANCE STATUS (ECOG) : 2 - Symptomatic, <50% confined to bed  Review of Systems Unless otherwise noted, a complete review of systems is negative.  Physical Exam General: NAD Cardiovascular: regular rate and rhythm Pulmonary: clear ant fields Abdomen: soft, nontender, + bowel sounds GU: no suprapubic tenderness  Extremities: no edema, no joint deformities Skin: no rashes Neurological: Weakness but otherwise nonfocal  IMPRESSION: Patient was an add-on to my clinic schedule today at Dr. Jerone request to address pain management.  Patient has significant back pain and is currently on regimen of MS Contin  60 mg every 12 hours and is taking oxycodone  10 mg every 2-3 hours for breakthrough pain.  Additionally, patient says that she is taking acetaminophen each time that she is taking oxycodone  with an estimated dosing that it exceeds safe standards.  I recommended the patient reduce her utilization of acetaminophen to max 4000 mg daily.  The patient appears to be tolerating opioids without any adverse effects.  Will increase MS Contin  15 mg every 8 hours and continue oxycodone  as needed for breakthrough pain.  Hopefully, increased dosing of MS Contin  will lessen utilization of oxycodone .  Additionally, will add dexamethasone  4 mg daily to help with probable inflammatory component of skeletal metastatic disease.  Patient completes XRT on 04/08/2024.  Will speak with IR to explore feasibility of RFA.  Patient verbalized understanding that she is not felt to have any viable treatment options for cancer and hospice has been recommended.  Patient says that she plans to speak with her family but is not ready to make any decisions today  regarding neck steps.  I sent patient home with a MOST form to review with family.  I encouraged her to explore end-of-life decision making.  PLAN: - Best supportive care - Recommend referral to hospice if patient agreeable - Increase MS Contin  15 mg every 8 hours - Continue oxycodone  5 to 10 mg every 4 hours as needed for breakthrough pain - Start dexamethasone  4 mg daily - Naloxone  - PDMP reviewed - Limit use of acetaminophen to 4000 mg daily - Daily bowel regimen - MOST Form reviewed - Referral to IR for consideration of RFA - Follow-up 1 week  Case and plan discussed with Dr. Jacobo   Patient expressed understanding and was in agreement with this plan. She also understands that She can call the clinic at any time with any questions, concerns, or complaints.     Time Total: 20 minutes  Visit consisted of counseling and education dealing with the complex and emotionally intense issues of symptom management and palliative care in the setting of serious and potentially life-threatening illness.Greater than 50%  of this time was spent counseling and coordinating care related to the above assessment and plan.  Signed by: Fonda Mower, PhD, NP-C

## 2024-04-07 ENCOUNTER — Other Ambulatory Visit: Payer: Self-pay

## 2024-04-07 ENCOUNTER — Ambulatory Visit
Admission: RE | Admit: 2024-04-07 | Discharge: 2024-04-07 | Disposition: A | Discharge status: Home / Self Care | Source: Ambulatory Visit | Attending: Radiation Oncology | Admitting: Radiation Oncology

## 2024-04-07 DIAGNOSIS — Z510 Encounter for antineoplastic radiation therapy: Secondary | ICD-10-CM | POA: Diagnosis not present

## 2024-04-07 LAB — RAD ONC ARIA SESSION SUMMARY
Course Elapsed Days: 12
Plan Fractions Treated to Date: 9
Plan Prescribed Dose Per Fraction: 3 Gy
Plan Total Fractions Prescribed: 10
Plan Total Prescribed Dose: 30 Gy
Reference Point Dosage Given to Date: 27 Gy
Reference Point Session Dosage Given: 3 Gy
Session Number: 9

## 2024-04-08 ENCOUNTER — Ambulatory Visit
Admission: RE | Admit: 2024-04-08 | Discharge: 2024-04-08 | Disposition: A | Discharge status: Home / Self Care | Source: Ambulatory Visit | Attending: Radiation Oncology | Admitting: Radiation Oncology

## 2024-04-08 ENCOUNTER — Ambulatory Visit (INDEPENDENT_AMBULATORY_CARE_PROVIDER_SITE_OTHER): Admitting: Pulmonary Disease

## 2024-04-08 ENCOUNTER — Encounter: Payer: Self-pay | Admitting: Pulmonary Disease

## 2024-04-08 ENCOUNTER — Other Ambulatory Visit: Payer: Self-pay

## 2024-04-08 VITALS — BP 120/74 | HR 98 | Temp 98.5°F | Ht 62.0 in | Wt 76.4 lb

## 2024-04-08 DIAGNOSIS — A310 Pulmonary mycobacterial infection: Secondary | ICD-10-CM | POA: Diagnosis not present

## 2024-04-08 DIAGNOSIS — R64 Cachexia: Secondary | ICD-10-CM | POA: Diagnosis not present

## 2024-04-08 DIAGNOSIS — Z510 Encounter for antineoplastic radiation therapy: Secondary | ICD-10-CM | POA: Diagnosis not present

## 2024-04-08 DIAGNOSIS — C3492 Malignant neoplasm of unspecified part of left bronchus or lung: Secondary | ICD-10-CM | POA: Diagnosis not present

## 2024-04-08 DIAGNOSIS — F17210 Nicotine dependence, cigarettes, uncomplicated: Secondary | ICD-10-CM

## 2024-04-08 DIAGNOSIS — J449 Chronic obstructive pulmonary disease, unspecified: Secondary | ICD-10-CM

## 2024-04-08 LAB — MAC SUSCEPTIBILITY BROTH
Amikacin: 8
Ciprofloxacin: 2
Clarithromycin: 1
Doxycycline: 8
Linezolid: 32
Minocycline: 8
Moxifloxacin: 0.5
Rifabutin: 0.25
Rifampin: 2
Streptomycin: 16

## 2024-04-08 LAB — ACID FAST CULTURE WITH REFLEXED SENSITIVITIES (MYCOBACTERIA): Acid Fast Culture: POSITIVE — AB

## 2024-04-08 LAB — RAD ONC ARIA SESSION SUMMARY
Course Elapsed Days: 13
Plan Fractions Treated to Date: 10
Plan Prescribed Dose Per Fraction: 3 Gy
Plan Total Fractions Prescribed: 10
Plan Total Prescribed Dose: 30 Gy
Reference Point Dosage Given to Date: 30 Gy
Reference Point Session Dosage Given: 3 Gy
Session Number: 10

## 2024-04-08 LAB — ACID FAST ID BY PCR AND SUSCEPTIBILITIES
M Tuberculosis Complex: NEGATIVE
M avium complex: POSITIVE — AB

## 2024-04-08 MED ORDER — ALBUTEROL SULFATE (2.5 MG/3ML) 0.083% IN NEBU: 2.5000 mg | INHALATION_SOLUTION | Freq: Four times a day (QID) | RESPIRATORY_TRACT | 5 refills | Status: DC | PRN

## 2024-04-08 NOTE — Progress Notes (Unsigned)
 Subjective:    Patient ID: Alejandra Ortiz, female    DOB: [birth year], 73 y.o.   MRN: [id number]  Patient Care Team: Antonette Angeline ORN, NP as PCP - General (Internal Medicine) Bluford Jacqulyn MATSU, DO as Consulting Physician (Family Medicine) [place], [facility] (Optometry) Verdene Gills, RN as Oncology Nurse Navigator Lenn Aran, MD as Consulting Physician (Radiation Oncology) Jacobo Evalene PARAS, MD as Consulting Physician (Oncology)  No chief complaint on file.   BACKGROUND/INTERVAL:  HPI    Review of Systems A 10 point review of systems was performed and it is as noted above otherwise negative.   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   Squamous cell carcinoma of lower lobe of left lung (HCC) 01/17/2023   Aortic atherosclerosis (HCC) 08/17/2021   Hyperlipidemia 02/09/2016   COPD (chronic obstructive pulmonary disease) with emphysema (HCC) 02/07/2016    Social History   Tobacco Use   Smoking status: Every Day    Current packs/day: 1.00    Average packs/day: 1 pack/day for 48.0 years (48.0 ttl pk-yrs)    Types: Cigarettes   Smokeless tobacco: Never   Tobacco comments:    1 PPD - khj 09/20/2023  Substance Use Topics   Alcohol use: No    Alcohol/week: 0.0 standard drinks of alcohol    No Known Allergies  No outpatient medications have been marked as taking for the 04/08/24 encounter (Appointment) with Tamea Dedra CROME, MD.    Immunization History  Administered Date(s) Administered   Fluad Quad(high Dose 65+) 07/08/2020, 07/03/2022   Fluad Trivalent(High Dose 65+) 05/07/2023   Influenza Split 05/27/2014   Influenza, High Dose Seasonal PF 06/07/2017, 06/28/2018, 06/26/2019, 08/03/2021   Influenza-Unspecified 06/25/2016   PFIZER(Purple  Top)SARS-COV-2 Vaccination 10/30/2019, 11/20/2019, 05/26/2020   Pneumococcal Conjugate-13 11/08/2014   Pneumococcal Polysaccharide-23 11/19/2016   Td 11/08/2014   Tdap 05/19/2018        Objective:     There were no vitals taken for this visit.     GENERAL: HEAD: Normocephalic, atraumatic.  EYES: Pupils equal, round, reactive to light.  No scleral icterus.  MOUTH:  NECK: Supple. No thyromegaly. Trachea midline. No JVD.  No adenopathy. PULMONARY: Good air entry bilaterally.  No adventitious sounds. CARDIOVASCULAR: S1 and S2. Regular rate and rhythm.  ABDOMEN: MUSCULOSKELETAL: No joint deformity, no clubbing, no edema.  NEUROLOGIC:  SKIN: Intact,warm,dry. PSYCH:        Assessment & Plan:   No diagnosis found.  No orders of the defined types were placed in this encounter.   No orders of the defined types were placed in this encounter.      Advised if symptoms do not improve or worsen, to please contact office for sooner follow up or seek emergency care.    I spent xxx minutes of dedicated to the care of this patient on the date of this encounter to include pre-visit review of records, face-to-face time with the patient discussing conditions above, post visit ordering of testing, clinical documentation with the electronic health record, making appropriate referrals as documented, and communicating necessary findings to members of the patients care team.     C. Leita Tamea, MD Advanced Bronchoscopy PCCM [facility]    *This note was generated using voice recognition software/Dragon and/or AI transcription program.  Despite best  efforts to proofread, errors can occur which can change the meaning. Any transcriptional errors that result from this process are unintentional and may not be fully corrected at the time of dictation.

## 2024-04-08 NOTE — Patient Instructions (Signed)
 VISIT SUMMARY:  Today, we discussed your follow-up care after completing treatment for non-small cell lung cancer. We reviewed your ongoing issues with COPD, MAC pulmonary infection, chronic pain, and tobacco use. We also talked about your current medications and potential changes to better manage your symptoms.  YOUR PLAN:  -CHRONIC OBSTRUCTIVE PULMONARY DISEASE (COPD): COPD is a chronic lung disease that makes it hard to breathe. You are currently using Trelegy and have access to your husband's inhalers. Since you declined the Albuterol  inhaler due to cost, we discussed using a nebulizer solution as an alternative, which is covered under Medicare Part B. Please use the nebulizer solution as needed.  -PULMONARY MYCOBACTERIUM AVIUM COMPLEX (MAC) INFECTION: MAC infection is a type of lung infection caused by bacteria. You were previously treated for this infection but had to stop the medication due to worsening cough. Currently, you do not have significant mucus production, so no new treatment is needed at this time.  -NON-SMALL CELL LUNG CANCER: Non-small cell lung cancer is a type of lung cancer. You have completed your treatment for this condition.  -TOBACCO USE DISORDER: Tobacco use disorder means you are dependent on smoking, which is harmful to your health. We discussed the importance of quitting smoking and encourage you to take steps towards cessation.  -CHRONIC PAIN SYNDROME: Chronic pain syndrome is ongoing pain that is not well controlled by your current medications. You have started on a low-dose steroid (prednisone  4 mg daily) to help reduce inflammation and pain. If you do not see improvement, you may consider trying Aleve as an alternative.  INSTRUCTIONS:  Please follow up as needed to monitor your symptoms and adjust your treatment plan. Use the nebulizer solution as needed for COPD, continue Dexamethasone  4 mg daily for chronic pain, and consider Aleve if there is no improvement. We  encourage you to take steps towards quitting smoking for your overall health.

## 2024-04-09 NOTE — Radiation Completion Notes (Signed)
 Patient Name: Alejandra Ortiz, Alejandra Ortiz MRN: [id number] Date of Birth: March 26, 1951 Referring Physician: ANGELINE LAURA, M.D. Date of Service: 2024-04-09 Radiation Oncologist: Marcey Penton, M.D. 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  She had been doing well.  With PET scan back in March 2025 showing no significant change in metabolic activity within on the dominant cavity of the left lower lobe mass consistent with known primary bronchogenic carcinoma.  No evidence of metastatic disease in the abdomen or pelvis was noted.  She started presenting with significant back pain about a month ago was seen in the emergency room where she was having no evidence of lower extremity weakness.  PET scan performed unfortunately showed new destructive hypermetabolic lesion of T7 with soft tissue extension into the central canal and neuroforamina.  She also had hypermetabolic nodules along the left side of the pleura new subcutaneous far right sided lower abdominal posterior hypermetabolic nodule.  MRI scan confirmed metastatic disease to the T7 vertebral body involving the posterior elements of the left with mild epidural component and paraspinous component.  No spinal cord impingement was noted.  She was consulted by neurosurgery thought not to be a candidate based on no formal cord impingement and is now referred to radiation oncology for consideration of treatment.  She is ambulating with some difficulty.  She does not have any focal  neurologic deficits in her lower extremity.      ==========DELIVERED PLANS==========  First Treatment Date: 2024-03-26 Last Treatment Date: 2024-04-08   Plan Name: Spine_T Site: Thoracic Spine Technique: IMRT Mode: Photon Dose Per Fraction: 3 Gy Prescribed Dose (Delivered / Prescribed): 30 Gy / 30 Gy Prescribed Fxs (Delivered / Prescribed): 10 / 10     ==========ON TREATMENT VISIT DATES========== 2024-03-31, 2024-03-31, 2024-04-07     ==========UPCOMING VISITS========== 06/09/2024 [facility] OFFICE VISIT - LINZIE Tamea Dedra CROME, MD  06/02/2024 [facility] OFFICE VISIT LAURA ANGELINE ORN, NP  05/18/2024 [facility] RAD ONCOLOGY FOLLOW UP 30 Penton Marcey, MD  05/05/2024 [facility] OFFICE VISIT Fayette Bodily, MD  04/21/2024 [facility] ON[facility] TELEPHONE OFFICE VISIT Borders, Fonda SAUNDERS, NP  04/13/2024 [facility] MED ONC EST PT Jacobo, Evalene PARAS, MD        ==========APPENDIX - ON TREATMENT VISIT NOTES==========   See weekly On Treatment Notes in Epic for details in the Media tab (listed as Progress notes on the On Treatment Visit Dates listed above).

## 2024-04-13 ENCOUNTER — Encounter: Payer: Self-pay | Admitting: Oncology

## 2024-04-13 ENCOUNTER — Other Ambulatory Visit: Payer: Self-pay | Admitting: *Deleted

## 2024-04-13 ENCOUNTER — Inpatient Hospital Stay (HOSPITAL_BASED_OUTPATIENT_CLINIC_OR_DEPARTMENT_OTHER): Admitting: Oncology

## 2024-04-13 VITALS — BP 121/61 | HR 100 | Temp 97.9°F | Resp 18 | Ht 62.0 in | Wt 74.0 lb

## 2024-04-13 DIAGNOSIS — C3432 Malignant neoplasm of lower lobe, left bronchus or lung: Secondary | ICD-10-CM | POA: Diagnosis not present

## 2024-04-13 DIAGNOSIS — G9529 Other cord compression: Secondary | ICD-10-CM | POA: Diagnosis not present

## 2024-04-13 DIAGNOSIS — C7951 Secondary malignant neoplasm of bone: Secondary | ICD-10-CM | POA: Diagnosis not present

## 2024-04-13 DIAGNOSIS — Z510 Encounter for antineoplastic radiation therapy: Secondary | ICD-10-CM | POA: Diagnosis not present

## 2024-04-13 DIAGNOSIS — G893 Neoplasm related pain (acute) (chronic): Secondary | ICD-10-CM | POA: Diagnosis not present

## 2024-04-13 MED ORDER — MORPHINE SULFATE ER 30 MG PO TBCR: 30.0000 mg | EXTENDED_RELEASE_TABLET | Freq: Three times a day (TID) | ORAL | 0 refills | Status: DC

## 2024-04-13 NOTE — Progress Notes (Signed)
 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  INTERVAL HISTORY: Patient returns to clinic today for further evaluation and treatment of her pain.  Her pain remains 10 out of 10 with no significant improvement despite adjustment of medications last week.  She also continues to have a poor appetite and weight loss despite adding dexamethasone  as well.  She has completed XRT.  She has no neurologic complaints.  She denies any recent fevers or illnesses.  She has no chest pain, shortness of breath, cough, or hemoptysis.  She denies any nausea, vomiting, constipation, or diarrhea.  She has no urinary complaints.  Patient feels generally terrible, but offers no further specific complaints today.    REVIEW OF SYSTEMS:   Review of Systems  Constitutional:  Positive for malaise/fatigue and weight loss. Negative for fever.  Respiratory: Negative.  Negative for cough, hemoptysis and shortness of breath.   Cardiovascular: Negative.  Negative for chest pain and leg swelling.  Gastrointestinal: Negative.  Negative for abdominal pain, blood in stool and melena.  Genitourinary: Negative.  Negative for dysuria.  Musculoskeletal:  Positive for back pain.  Skin: Negative.  Negative for rash.  Neurological:  Positive for weakness. Negative for dizziness, focal weakness and headaches.  Psychiatric/Behavioral: Negative.  The patient is not nervous/anxious.      As per HPI. Otherwise, a complete review of systems is negative.  PAST MEDICAL HISTORY: Past Medical History:  Diagnosis Date   Chicken pox    COPD (chronic obstructive pulmonary disease) (HCC)    Hyperlipidemia    Measles     PAST SURGICAL HISTORY: Past Surgical History:  Procedure Laterality Date   TUBAL LIGATION  1978    FAMILY HISTORY: Family History  Family history unknown: Yes    ADVANCED DIRECTIVES (Y/N):  N  HEALTH MAINTENANCE: Social History   Tobacco Use   Smoking status: Every Day    Current packs/day: 1.00    Average packs/day: 1 pack/day for 48.0 years (48.0 ttl pk-yrs)    Types: Cigarettes   Smokeless tobacco: Never   Tobacco comments:    1 PPD - khj 09/20/2023  Vaping Use   Vaping status: Never Used  Substance Use Topics   Alcohol use: No    Alcohol/week: 0.0 standard drinks of alcohol   Drug use: No     Colonoscopy:  PAP:  Bone density:  Lipid panel:  No Known Allergies  Current Outpatient Medications  Medication Sig Dispense Refill   albuterol  (PROVENTIL ) (2.5 MG/3ML) 0.083% nebulizer solution Take 3 mLs (2.5 mg total) by nebulization every 6 (six) hours as needed for wheezing or shortness of breath. 75 mL 5   albuterol  (VENTOLIN  HFA) 108 (90 Base) MCG/ACT inhaler Inhale 2 puffs into the lungs every 6 (six) hours as needed for wheezing or shortness of breath. 54 g 0   Amikacin  Sulfate Liposome (ARIKAYCE ) 590 MG/8.4ML SUSP Inhale 590 mg into the lungs daily. (Patient not taking: Reported on 04/08/2024)     aspirin  EC 81 MG tablet Take 1 tablet (81  mg total) by mouth daily. Swallow whole. 30 tablet 12   azithromycin  (ZITHROMAX ) 250 MG tablet Take 1 tablet (250 mg total) by mouth daily. 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 30 tablet 0   ethambutol  (MYAMBUTOL ) 400 MG tablet TAKE 1 AND 1/2 TABLETS(600 MG) BY MOUTH DAILY 45  tablet 2   lidocaine  (LIDODERM ) 5 % Place 1 patch onto the skin every 12 (twelve) hours. Remove & Discard patch within 12 hours or as directed by MD 10 patch 0   morphine  (MS CONTIN ) 30 MG 12 hr tablet Take 1 tablet (30 mg total) by mouth every 8 (eight) hours. 90 tablet 0   naloxone  (NARCAN ) nasal spray 4 mg/0.1 mL SPRAY 1 SPRAY INTO ONE NOSTRIL AS DIRECTED FOR OPIOID OVERDOSE (TURN PERSON ON SIDE AFTER DOSE. IF NO RESPONSE IN 2-3 MINUTES OR PERSON RESPONDS BUT RELAPSES, REPEAT USING A NEW SPRAY DEVICE AND SPRAY INTO THE OTHER NOSTRIL. CALL 911 AFTER USE.) * EMERGENCY USE ONLY * 1 each 0   oxyCODONE  (OXY IR/ROXICODONE ) 5 MG immediate release tablet Take 1-2 tablets (5-10 mg total) by mouth every 4 (four) hours as needed for severe pain (pain score 7-10). Take 1-2 tablets every 4-6 hours as needed for pain 90 tablet 0   pantoprazole  (PROTONIX ) 40 MG tablet Take 1 tablet (40 mg total) by mouth 2 (two) times daily before a meal for 14 days. 28 tablet 0   rifampin  (RIFADIN ) 150 MG capsule Take 3 capsules (450 mg total) by mouth daily. 90 capsule 2   TRELEGY ELLIPTA  200-62.5-25 MCG/ACT AEPB USE 1 INHALATION DAILY 180 each 3   No current facility-administered medications for this visit.    OBJECTIVE: Vitals:   04/13/24 1348  BP: 121/61  Pulse: 100  Resp: 18  Temp: 97.9 F (36.6 C)  SpO2: 98%      Body mass index is 13.53 kg/m.    ECOG FS:2 - Symptomatic, <50% confined to bed  General: Thin, mild distress secondary to pain. Eyes: Pink conjunctiva, anicteric sclera. HEENT: Normocephalic, moist mucous membranes. Lungs: No audible wheezing or coughing. Heart: Regular rate and rhythm. Abdomen: Soft, nontender, no obvious distention. Musculoskeletal: No edema, cyanosis, or clubbing. Neuro: Alert, answering all questions appropriately. Cranial nerves grossly intact. Skin: No rashes or petechiae noted. Psych: Normal affect.  LAB RESULTS:  Lab Results  Component Value Date   NA 133 (L)  03/19/2024   K 3.1 (L) 03/19/2024   CL 101 03/19/2024   CO2 24 03/19/2024   GLUCOSE 137 (H) 03/19/2024   BUN 14 03/19/2024   CREATININE 0.81 03/19/2024   CALCIUM  8.8 (L) 03/19/2024   PROT 7.0 03/06/2024   ALBUMIN 3.0 (L) 03/06/2024   AST 16 03/06/2024   ALT 11 03/06/2024   ALKPHOS 70 03/06/2024   BILITOT 0.7 03/06/2024   GFRNONAA >60 03/19/2024   GFRAA >60 09/25/2013    Lab Results  Component Value Date   WBC 6.8 03/19/2024   NEUTROABS 5.3 03/19/2024   HGB 11.6 (L) 03/19/2024   HCT 36.5 03/19/2024   MCV 96.6 03/19/2024   PLT 324 03/19/2024    STUDIES: MR THORACIC SPINE W WO CONTRAST Result Date: 03/19/2024 CLINICAL DATA:  Ataxia, nontraumatic, thoracic pathology suspected EXAM: MRI THORACIC WITHOUT AND WITH CONTRAST TECHNIQUE: Multiplanar and multiecho pulse sequences of the thoracic spine were obtained without and with intravenous contrast. CONTRAST:  4mL GADAVIST  GADOBUTROL   1 MMOL/ML IV SOLN COMPARISON:  PET-CT fusion study dated March 16, 2024. FINDINGS: Alignment:  Normal. Vertebrae: A metastatic lesion is again demonstrated within the left posterolateral aspect of the T7 vertebral body, also involving the left pedicle, lamina, transverse process and paraspinous soft tissues. The lesion is hypointense on T1 and hyperintense on T2, demonstrating avid enhancement. There is mild encroachment of the spinal canal posterolaterally on the left, resulting in mild left spinal canal stenosis. There is no spinal cord impingement. Cord:  Normal in morphology and signal intensity. Paraspinal and other soft tissues: There is paraspinous soft tissue tumor present posteriorly on the left at the T7 vertebral body level, with mild epidural encroachment. There is a cavitary mass present posterolaterally within the right upper lung. There are also nodular past these present posteriorly within the lower lobes. Disc levels: The disc spaces are satisfactory preserved throughout the thoracic spine. There is  mild central spinal canal stenosis at T7 secondary to infiltrating tumor. The spinal canal is widely patent otherwise. IMPRESSION: 1. 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 There is mild central spinal canal stenosis, but no spinal cord impingement. 2. Cavitary lesion within the right upper lung zone, compatible with primary lung cancer. Electronically Signed   By: Evalene Coho M.D.   On: 03/19/2024 15:37   NM PET Image Restag (PS) Skull Base To Thigh Addendum Date: 03/19/2024 ADDENDUM REPORT: 03/19/2024 12:28 ADDENDUM: Critical Value/emergent results were called by telephone at the time of interpretation on 03/19/2024 at 12:27 pm to provider Tinnie Dawn, who verbally acknowledged these results. Electronically Signed   By: Ranell Bring M.D.   On: 03/19/2024 12:28   Result Date: 03/19/2024 CLINICAL DATA:  Subsequent treatment strategy for lung cancer. EXAM: NUCLEAR MEDICINE PET SKULL BASE TO THIGH TECHNIQUE: 5.72 mCi F-18 FDG was injected intravenously. Full-ring PET imaging was performed from the skull base to thigh after the radiotracer. CT data was obtained and used for attenuation correction and anatomic localization. Fasting blood glucose: 100 mg/dl COMPARISON:  PET-CT 96/81/7974 FINDINGS: Mediastinal blood pool activity: SUV max 1.9 Liver activity: SUV max 2.2 NECK: The left hypermetabolic retroauricular soft tissue nodule or node is again seen. Previously maximum SUV 11.4 and dimension of of 1.4 cm. Today maximum SUV of 16.0 and maximum dimension of 17 mm on series 6, image 11. No specific other areas of abnormal uptake in the neck including along lymph node change of the submandibular, posterior triangle or internal jugular regions. Incidental CT findings: Visualized portions of the paranasal sinuses and mastoid air cells are clear except for Passy along the small left maxillary sinus.  Opacity is more confluent today. Mild vascular calcifications. Submandibular glands are unremarkable. Small thyroid  gland. CHEST: Bilateral cavitary lesions are again seen. The left lower lobe focus which previously had maximum SUV value of 20.6 and dimension of 4.8 x 3.6 cm, today has decreased uptake with maximum SUV value 2.2. Lesion today on image 63 of series 6 measures 3.0 by 2.2 cm. This abuts the pleura. The previous air in the lesion is resolved. Is also a decreased left pleural effusion. However there are new nodular areas uptake along the pleura with some thickening of these 4 such foci are identified. Example has maximum SUV of 7.6 on image 74. Dimension proximally 10 mm. Additional small focus left lung apex posteriorly along the pleura with maximum SUV of 3.8 on image 30. The right  upper lobe cavitary focus on the prior had uptake along its medial wall of maximum SUV value of 5.0 and dimension of 4.2 x 4.1 cm. Today this same area has maximum SUV of 4.6 along the superomedial aspect of lesion and dimension on image 32 of series 6 proximally 4.6 by 4.2 cm. There are other areas around the lesion which shows slightly more uptake including a nodular focus anteriorly maximum SUV of 5.8 in this location today and previously 3.9. The nodules along the medial aspect this lesion extending to the hilum previously had less uptake and today maximum SUV of 3.5 as seen on image 39. Other areas of nodularity identified such as in the superior segment of the lower lobes is similar to previous and does not show significant uptake. No specific abnormal uptake seen above blood pool in the axillary region, hilum or mediastinum. Incidental CT findings: Advanced emphysematous lung changes identified. No pneumothorax. Heart is nonenlarged. Coronary artery calcifications are seen. Trace pericardial fluid. Normal caliber slightly patulous thoracic esophagus. Biapical pleural thickening with calcifications. ABDOMEN/PELVIS:  Physiologic distribution radiotracer along the parenchymal organs and renal collecting systems. The area of uptake along rectosigmoid region which previously measured 8.7 SUV maximum, today 11.5. Again worrisome for potential bowel neoplasm. Please correlate with prior or dedicated uptake when able. No other areas of abnormal bowel uptake. However there is a new hypermetabolic subcutaneous fat nodule on the right side posteriorly with maximum SUV value of 3.7 and measuring 4 mm on series 6, image 99. A new soft tissue metastasis is possible but there is a differential. Incidental CT findings: Diffuse vascular calcifications. No bowel obstruction. Diffuse anasarca. Gallbladder is distended. Grossly the liver, spleen, adrenal glands are unremarkable. No abnormal calcifications seen within either kidney nor along the course of either ureter. Colonic diverticula. SKELETON: There is a new abnormal area of uptake of maximum SUV of 18.1 corresponding to a large soft tissue destructive mass involving the posterior elements at the T7 level. There is soft tissue extending along the central canal and neural foramen. Large area of bone destruction identified. Please correlate for the stability of this lesion. No other areas of abnormal bony uptake. Incidental CT findings: Scattered degenerative changes. Critical Value/emergent results were called by telephone at the time of interpretation on 03/19/2024 at 11:14 am to provider [facility] , who verbally acknowledged these results. IMPRESSION: Overall progression of disease. New destructive hypermetabolic bony lesion involving the posterior limb of the left side at T7 with soft tissue extending into the central canal and neural foramen. Recommend additional workup with MRI to assess for cord compression and bony stability of the spine. Developing hypermetabolic nodules along the left side of the pleura. Four lesions seen at the left lung base and 1 at the left lung apex. New  subcutaneous fat right-sided lower abdominal posterior hypermetabolic nodule. This has a differential although a soft tissue metastasis is possible. Left lower lobe cavitary lesion appears smaller has less uptake consistent with an area of improved disease. The cavitary lesion in the right upper lobe is similar in size. Some of the areas of uptake along its margin are slightly increased as well as slight increase in the adjacent nodularity. Slight increase in size and uptake of a left-sided postauricular lymph node or nodule in the neck. Persistent nodule with uptake along the rectosigmoid colon please correlate with a separate bowel lesion. Electronically Signed: By: Ranell Bring M.D. On: 03/19/2024 11:16     ASSESSMENT: Stage IV squamous cell  carcinoma left lower lobe lung, MAC, hypermetabolic sigmoid colon lesion.  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  PET scan and MRI of the brain from November 12, 2023 revealed no evidence of metastatic disease. Case discussed with pulmonary at that time and it was agreed upon that although patient had high risk disease, giving concurrent chemotherapy may be more detrimental and patient completed XRT only.  Circulogene testing was negative for any actionable mutations with a low tumor mutation burden and negative PD-L1.  Repeat PET scan on March 16, 2024 revealed widespread metastatic lesions in patient's bones as well as a destructive hypermetabolic lesion in T7 causing cord compression.  Patient has completed XRT to this lesion with little improvement in her pain.  We once again discussed that patient does not have significant treatment options and she has now agreed to hospice.  No further follow-up has been scheduled. Hypermetabolic sigmoid lesion: Improved on most recent PET scan.  Previously, patient was given referral to GI for further evaluation and consideration of colonoscopy. CEA is  within normal limits at 3.8.  Patient cannot undergo general anesthesia. MAC infection: Continue current antibiotics.  Follow-up and treatment per pulmonary.  Hospice as above. Parotid lesion: Unchanged from previous on recent PET scan. Pain: Increase MS Contin  to 30 mg every 8 hours.  Continue 5 to 10 mg immediate release oxycodone  every 3-4 hours.  Continue 4 mg daily  dexamethasone .  Given patient's minimal body fat, is unlikely fentanyl  patch would be effective.   Poor appetite/weight loss: Continue dexamethasone  as above.  I spent a total of 30 minutes reviewing chart data, face-to-face evaluation with the patient, counseling and coordination of care as detailed above.    Patient expressed understanding and was in agreement with this plan. She also understands that She can call clinic at any time with any questions, concerns, or complaints.    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

## 2024-04-13 NOTE — Progress Notes (Signed)
 Patient is still in severe pain spine (rates pain at 10), which just keeps getting worse, nothing is really helping to relive the pain, she has heard that fentanyl  might be pain medication that could help her to be comfortable, she isn't able to sleep, eat. She is down to 74 lbs today, still with out appetite, which is due to her taste being so horrible.

## 2024-04-15 ENCOUNTER — Ambulatory Visit: Admitting: Radiation Oncology

## 2024-04-17 ENCOUNTER — Encounter: Payer: Self-pay | Admitting: Pulmonary Disease

## 2024-04-19 LAB — ACID FAST CULTURE WITH REFLEXED SENSITIVITIES (MYCOBACTERIA)
Acid Fast Culture: NEGATIVE
Acid Fast Culture: NEGATIVE

## 2024-04-21 ENCOUNTER — Inpatient Hospital Stay (HOSPITAL_BASED_OUTPATIENT_CLINIC_OR_DEPARTMENT_OTHER): Admitting: Hospice and Palliative Medicine

## 2024-04-21 DIAGNOSIS — C3432 Malignant neoplasm of lower lobe, left bronchus or lung: Secondary | ICD-10-CM | POA: Diagnosis not present

## 2024-04-21 NOTE — Progress Notes (Signed)
 Virtual Visit via Telephone Note  I connected with Alejandra Ortiz on 04/21/24 at  3:20 PM EDT by telephone and verified that I am speaking with the correct person using two identifiers.  Location: Patient: Home Provider: Clinic   I discussed the limitations, risks, security and privacy concerns of performing an evaluation and management service by telephone and the availability of in person appointments. I also discussed with the patient that there may be a patient responsible charge related to this service. The patient expressed understanding and agreed to proceed.   History of Present Illness: Alejandra Ortiz is a 73 y.o. 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  Patient found to have a large destructive hypermetabolic lesion at T7 causing cord compression.  She is on XRT.  Patient is not felt to have any viable systemic treatment options and hospice has been recommended.  Palliative care was consulted to address goals and manage ongoing symptoms.  Observations/Objective: Patient was transitioned to hospice after follow-up visit on 04/13/2024 with Dr. Jacobo. I called and spoke with [name] who confirmed that they have started services. Hospice RN - Annabella Sharps.   I called and spoke with patient.  Patient reports she is doing better.  Overall, has significant improvement in pain on higher dose of MS Contin .  Denies any adverse effects.  Patient denied any symptomatic complaints or concerns today.  Assessment and Plan: Stage IV NSCLC -on hospice  Neoplasm related pain -continue MS Contin /oxycodone .  Daily bowel regimen to prevent opioid-induced constipation  Follow Up Instructions:  No follow-up scheduled but I encouraged patient to call if needed.   I discussed the assessment and treatment plan with the patient. The patient was provided an opportunity to ask  questions and all were answered. The patient agreed with the plan and demonstrated an understanding of the instructions.   The patient was advised to call back or seek an in-person evaluation if the symptoms worsen or if the condition fails to improve as anticipated.  I provided 10 minutes of non-face-to-face time during this encounter.   Alejandra JONELLE MOWER, NP

## 2024-04-27 ENCOUNTER — Other Ambulatory Visit: Payer: Self-pay | Admitting: Infectious Diseases

## 2024-05-05 ENCOUNTER — Ambulatory Visit: Admitting: Infectious Diseases

## 2024-05-18 ENCOUNTER — Ambulatory Visit: Admitting: Radiation Oncology

## 2024-06-02 ENCOUNTER — Ambulatory Visit: Admitting: Internal Medicine

## 2024-06-02 NOTE — Progress Notes (Deleted)
 Subjective:    Patient ID: Alejandra Ortiz, female    DOB: 1950/10/27, 73 y.o.   MRN: [id number]  HPI  Patient presents to clinic today for follow-up of chronic conditions.  COPD with lung nodules, MAI: She reports chronic cough but she denies shortness of breath.  She is using trelegy and albuterol  as prescribed. She is also taking azithromycin , rifampin , arikayce  and ethambutol  as prescribed. There are no PFTs on file. She is getting radiation. She does continue to smoke.  She follows with pulmonology, infectious disease.  HLD with aortic atherosclerosis: Her last LDL was 90, triglycerides 18,/7975. She is not taking any cholesterol-lowering medication but is taking aspirin .  She does not consume a low-fat diet.  Lung cancer: She has undergone radiation.  She is unable to do chemo. She follows with oncology  Anemia: Her last H/H was 11.6/36.5, 02/2024.  She is not taking any oral iron at this time.  She follows with oncology.  Prediabetes: Her last A1c was 5.7%, 11/2023.  She is not taking any oral diabetic medications time.  She does not check her sugars.  GERD: Triggered by.  She denies breakthrough on pantoprazole .  There is no upper GI on file.  Review of Systems     Past Medical History:  Diagnosis Date   Chicken pox    COPD (chronic obstructive pulmonary disease) (HCC)    Hyperlipidemia    Measles     Current Outpatient Medications  Medication Sig Dispense Refill   albuterol  (PROVENTIL ) (2.5 MG/3ML) 0.083% nebulizer solution Take 3 mLs (2.5 mg total) by nebulization every 6 (six) hours as needed for wheezing or shortness of breath. 75 mL 5   albuterol  (VENTOLIN  HFA) 108 (90 Base) MCG/ACT inhaler Inhale 2 puffs into the lungs every 6 (six) hours as needed for wheezing or shortness of breath. 54 g 0   Amikacin  Sulfate Liposome (ARIKAYCE ) 590 MG/8.4ML SUSP Inhale 590 mg into the lungs daily. (Patient not taking: Reported on 04/08/2024)     aspirin  EC 81 MG tablet Take 1  tablet (81 mg total) by mouth daily. Swallow whole. 30 tablet 12   azithromycin  (ZITHROMAX ) 250 MG tablet TAKE 1 TABLET BY MOUTH DAILY 30 tablet 0   dexamethasone  (DECADRON ) 4 MG tablet Take 1 tablet (4 mg total) by mouth daily. 30 tablet 0   ethambutol  (MYAMBUTOL ) 400 MG tablet TAKE 1 AND 1/2 TABLETS(600 MG) BY MOUTH DAILY 45 tablet 0   lidocaine  (LIDODERM ) 5 % Place 1 patch onto the skin every 12 (twelve) hours. Remove & Discard patch within 12 hours or as directed by MD 10 patch 0   morphine  (MS CONTIN ) 30 MG 12 hr tablet Take 1 tablet (30 mg total) by mouth every 8 (eight) hours. 90 tablet 0   naloxone  (NARCAN ) nasal spray 4 mg/0.1 mL SPRAY 1 SPRAY INTO ONE NOSTRIL AS DIRECTED FOR OPIOID OVERDOSE (TURN PERSON ON SIDE AFTER DOSE. IF NO RESPONSE IN 2-3 MINUTES OR PERSON RESPONDS BUT RELAPSES, REPEAT USING A NEW SPRAY DEVICE AND SPRAY INTO THE OTHER NOSTRIL. CALL 911 AFTER USE.) * EMERGENCY USE ONLY * 1 each 0   oxyCODONE  (OXY IR/ROXICODONE ) 5 MG immediate release tablet Take 1-2 tablets (5-10 mg total) by mouth every 4 (four) hours as needed for severe pain (pain score 7-10). Take 1-2 tablets every 4-6 hours as needed for pain 90 tablet 0   pantoprazole  (PROTONIX ) 40 MG tablet Take 1 tablet (40 mg total) by mouth 2 (two) times daily before a  meal for 14 days. 28 tablet 0   rifampin  (RIFADIN ) 150 MG capsule TAKE 3 CAPSULES(450 MG) BY MOUTH DAILY 90 capsule 0   TRELEGY ELLIPTA  200-62.5-25 MCG/ACT AEPB USE 1 INHALATION DAILY 180 each 3   No current facility-administered medications for this visit.    No Known Allergies  Family History  Family history unknown: Yes    Social History   Socioeconomic History   Marital status: Married    Spouse name: Grayson White   Number of children: 5   Years of education: Not on file   Highest education level: Not on file  Occupational History   Occupation: Knit    Comment: [employer]  Tobacco Use   Smoking status: Every Day    Current  packs/day: 1.00    Average packs/day: 1 pack/day for 48.0 years (48.0 ttl pk-yrs)    Types: Cigarettes   Smokeless tobacco: Never   Tobacco comments:    1 PPD - khj 09/20/2023  Vaping Use   Vaping status: Never Used  Substance and Sexual Activity   Alcohol use: No    Alcohol/week: 0.0 standard drinks of alcohol   Drug use: No   Sexual activity: Never    Birth control/protection: Surgical  Other Topics Concern   Not on file  Social History Narrative   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   Lives with husband and 5 children with 12 grandchildren, 4 great-grandchildren   1 dog lives inside    Associates Degree   Right hand dominant    Enjoys reading    Social Drivers of Corporate investment banker Strain: Low Risk  (08/30/2023)   Overall Financial Resource Strain (CARDIA)    Difficulty of Paying Living Expenses: Not hard at all  Food Insecurity: No Food Insecurity (08/30/2023)   Hunger Vital Sign    Worried About Running Out of Food in the Last Year: Never true    Ran Out of Food in the Last Year: Never true  Transportation Needs: No Transportation Needs (08/30/2023)   PRAPARE - Administrator, Civil Service (Medical): No    Lack of Transportation (Non-Medical): No  Physical Activity: Insufficiently Active (08/30/2023)   Exercise Vital Sign    Days of Exercise per Week: 3 days    Minutes of Exercise per Session: 30 min  Stress: No Stress Concern Present (08/30/2023)   [employer] of Occupational Health - Occupational Stress Questionnaire    Feeling of Stress : Not at all  Social Connections: Moderately Isolated (08/30/2023)   Social Connection and Isolation Panel    Frequency of Communication with Friends and Family: Once a week    Frequency of Social Gatherings with Friends and Family: More than three times a week    Attends Religious Services: Never    Database administrator or Organizations: No    Attends Banker Meetings: Never    Marital  Status: Married  Catering manager Violence: Not At Risk (08/30/2023)   Humiliation, Afraid, Rape, and Kick questionnaire    Fear of Current or Ex-Partner: No    Emotionally Abused: No    Physically Abused: No    Sexually Abused: No     Constitutional: Denies fever, malaise, fatigue, headache or abrupt weight changes.  HEENT: Denies eye pain, eye redness, ear pain, ringing in the ears, wax buildup, runny nose, nasal congestion, bloody nose, or sore throat. Respiratory: Patient reports chronic cough.  Denies difficulty breathing, shortness of breath.  Cardiovascular: Denies chest pain, chest tightness, palpitations or swelling in the hands or feet.  Gastrointestinal: Denies abdominal pain, bloating, constipation, diarrhea or blood in the stool.  GU: Denies urgency, frequency, pain with urination, burning sensation, blood in urine, odor or discharge. Musculoskeletal: Denies decrease in range of motion, difficulty with gait, muscle pain or joint pain and swelling.  Skin: Denies redness, rashes, lesions or ulcercations.  Neurological: Denies dizziness, difficulty with memory, difficulty with speech or problems with balance and coordination.  Psych: Denies anxiety, depression, SI/HI.  No other specific complaints in a complete review of systems (except as listed in HPI above).  Objective:   Physical Exam  There were no vitals taken for this visit.   Wt Readings from Last 3 Encounters:  04/13/24 74 lb (33.6 kg)  04/08/24 76 lb 6.4 oz (34.7 kg)  04/06/24 77 lb 11.2 oz (35.2 kg)    General: Appears her stated age, underweight, in NAD. Skin: Warm, dry and intact.  Cardiovascular: Normal rate and rhythm. S1,S2 noted.  No murmur, rubs or gallops noted. No JVD or BLE edema. No carotid bruits noted. Pulmonary/Chest: Normal effort and diminished breath sounds. No respiratory distress. No wheezes, rales or ronchi noted.  Musculoskeletal: Muscle wasting noted of upper and lower extremities.  No  difficulty with gait.  Neurological: Alert and oriented. Coordination normal.  Psychiatric: Mood and affect normal. Behavior is normal. Judgment and thought content normal.     BMET    Component Value Date/Time   NA 133 (L) 03/19/2024 1407   NA 137 09/25/2013 0950   K 3.1 (L) 03/19/2024 1407   K 3.6 09/25/2013 0950   CL 101 03/19/2024 1407   CL 106 09/25/2013 0950   CO2 24 03/19/2024 1407   CO2 27 09/25/2013 0950   GLUCOSE 137 (H) 03/19/2024 1407   GLUCOSE 103 (H) 09/25/2013 0950   BUN 14 03/19/2024 1407   BUN 14 09/25/2013 0950   CREATININE 0.81 03/19/2024 1407   CREATININE 0.65 12/02/2023 1341   CALCIUM  8.8 (L) 03/19/2024 1407   CALCIUM  8.9 09/25/2013 0950   GFRNONAA >60 03/19/2024 1407   GFRNONAA >60 11/07/2023 1556   GFRNONAA 57 (L) 09/25/2013 0950   GFRAA >60 09/25/2013 0950    Lipid Panel     Component Value Date/Time   CHOL 157 12/02/2023 1341   TRIG 80 12/02/2023 1341   HDL 49 (L) 12/02/2023 1341   CHOLHDL 3.2 12/02/2023 1341   VLDL 12.0 03/17/2019 1401   LDLCALC 91 12/02/2023 1341    CBC    Component Value Date/Time   WBC 6.8 03/19/2024 1407   RBC 3.78 (L) 03/19/2024 1407   HGB 11.6 (L) 03/19/2024 1407   HGB 12.3 09/25/2013 0950   HCT 36.5 03/19/2024 1407   HCT 35.4 09/25/2013 0950   PLT 324 03/19/2024 1407   PLT 183 09/25/2013 0950   MCV 96.6 03/19/2024 1407   MCV 98 09/25/2013 0950   MCH 30.7 03/19/2024 1407   MCHC 31.8 03/19/2024 1407   RDW 12.8 03/19/2024 1407   RDW 12.4 09/25/2013 0950   LYMPHSABS 0.7 03/19/2024 1407   MONOABS 0.6 03/19/2024 1407   EOSABS 0.2 03/19/2024 1407   BASOSABS 0.0 03/19/2024 1407    Hgb A1C Lab Results  Component Value Date   HGBA1C 5.7 (H) 12/02/2023            Assessment & Plan:    RTC in 6 months, follow-up chronic conditions Angeline Laura, NP

## 2024-06-08 ENCOUNTER — Encounter: Payer: Self-pay | Admitting: Internal Medicine

## 2024-06-09 ENCOUNTER — Ambulatory Visit: Admitting: Pulmonary Disease

## 2024-06-15 ENCOUNTER — Telehealth: Payer: Self-pay | Admitting: *Deleted

## 2024-06-15 NOTE — Telephone Encounter (Signed)
 Tiffany says that the patient is getting 30 mg of MS Contin  3 times a day and they have her on intermittent for pain she gets roxanol  0.75 every 2 hours liquid- (15mg ) every 2 hours and Tiffany the nurse for [facility] that she can get more MScontin   and not have to give the intermittent med every 2 hours

## 2024-06-27 DEATH — deceased

## 2024-09-09 ENCOUNTER — Encounter
